# Patient Record
Sex: Female | Born: 1935
Health system: Southern US, Community
[De-identification: ages and names within clinical notes are randomized; demographics above are authoritative.]

## PROBLEM LIST (undated history)

## (undated) DIAGNOSIS — K8020 Calculus of gallbladder without cholecystitis without obstruction: Secondary | ICD-10-CM

## (undated) DIAGNOSIS — Z860100 Personal history of colon polyps, unspecified: Secondary | ICD-10-CM

## (undated) DIAGNOSIS — K5730 Diverticulosis of large intestine without perforation or abscess without bleeding: Secondary | ICD-10-CM

## (undated) DIAGNOSIS — N183 Chronic kidney disease, stage 3 (moderate): Secondary | ICD-10-CM

## (undated) DIAGNOSIS — I351 Nonrheumatic aortic (valve) insufficiency: Secondary | ICD-10-CM

## (undated) DIAGNOSIS — E785 Hyperlipidemia, unspecified: Secondary | ICD-10-CM

## (undated) DIAGNOSIS — F419 Anxiety disorder, unspecified: Secondary | ICD-10-CM

## (undated) DIAGNOSIS — I10 Essential (primary) hypertension: Secondary | ICD-10-CM

## (undated) DIAGNOSIS — M1990 Unspecified osteoarthritis, unspecified site: Secondary | ICD-10-CM

## (undated) DIAGNOSIS — M5030 Other cervical disc degeneration, unspecified cervical region: Secondary | ICD-10-CM

## (undated) DIAGNOSIS — J449 Chronic obstructive pulmonary disease, unspecified: Secondary | ICD-10-CM

## (undated) HISTORY — PX: ABDOMINAL HYSTERECTOMY: SHX81

## (undated) HISTORY — DX: Essential (primary) hypertension: I10

## (undated) HISTORY — DX: Hyperlipidemia, unspecified: E78.5

## (undated) HISTORY — PX: CATARACT EXTRACTION: SUR2

## (undated) HISTORY — DX: Diverticulosis of large intestine without perforation or abscess without bleeding: K57.30

## (undated) HISTORY — DX: Anxiety disorder, unspecified: F41.9

## (undated) HISTORY — PX: COLONOSCOPY W/ POLYPECTOMY: SHX1380

## (undated) HISTORY — DX: Unspecified osteoarthritis, unspecified site: M19.90

## (undated) HISTORY — PX: EYE SURGERY: SHX253

## (undated) HISTORY — DX: Personal history of colon polyps, unspecified: Z86.0100

## (undated) HISTORY — DX: Calculus of gallbladder without cholecystitis without obstruction: K80.20

## (undated) HISTORY — DX: Chronic kidney disease, stage 3 (moderate): N18.3

## (undated) HISTORY — DX: Nonrheumatic aortic (valve) insufficiency: I35.1

## (undated) HISTORY — DX: Chronic obstructive pulmonary disease, unspecified: J44.9

## (undated) HISTORY — PX: OOPHORECTOMY: SHX86

## (undated) HISTORY — DX: Other cervical disc degeneration, unspecified cervical region: M50.30

## (undated) HISTORY — DX: Personal history of colonic polyps: Z86.010

---

## 2003-12-20 ENCOUNTER — Ambulatory Visit (HOSPITAL_COMMUNITY): Admission: RE | Admit: 2003-12-20 | Discharge: 2003-12-20 | Payer: Self-pay | Admitting: Internal Medicine

## 2003-12-31 ENCOUNTER — Ambulatory Visit: Payer: Self-pay

## 2004-05-21 ENCOUNTER — Other Ambulatory Visit: Admission: RE | Admit: 2004-05-21 | Discharge: 2004-05-21 | Payer: Self-pay | Admitting: Obstetrics and Gynecology

## 2005-02-08 ENCOUNTER — Ambulatory Visit: Payer: Self-pay | Admitting: Internal Medicine

## 2005-02-16 ENCOUNTER — Ambulatory Visit: Payer: Self-pay | Admitting: Internal Medicine

## 2005-03-04 ENCOUNTER — Ambulatory Visit: Payer: Self-pay | Admitting: Internal Medicine

## 2005-03-22 ENCOUNTER — Ambulatory Visit (HOSPITAL_COMMUNITY): Admission: RE | Admit: 2005-03-22 | Discharge: 2005-03-22 | Payer: Self-pay | Admitting: Internal Medicine

## 2006-01-06 ENCOUNTER — Ambulatory Visit: Payer: Self-pay | Admitting: Internal Medicine

## 2006-01-06 LAB — CONVERTED CEMR LAB
ALT: 34 units/L (ref 0–40)
AST: 30 units/L (ref 0–37)
Albumin: 4.3 g/dL (ref 3.5–5.2)
Alkaline Phosphatase: 64 units/L (ref 39–117)
BUN: 19 mg/dL (ref 6–23)
Bacteria, U Microscopic: NEGATIVE /hpf
Basophils Absolute: 0 10*3/uL (ref 0.0–0.1)
Basophils Relative: 0.4 % (ref 0.0–1.0)
Bilirubin Urine: NEGATIVE
CO2: 27 meq/L (ref 19–32)
Calcium: 9.8 mg/dL (ref 8.4–10.5)
Chloride: 103 meq/L (ref 96–112)
Chol/HDL Ratio, serum: 3.8
Cholesterol: 204 mg/dL (ref 0–200)
Creatinine, Ser: 1.2 mg/dL (ref 0.4–1.2)
Crystals: NEGATIVE
Eosinophil percent: 3.9 % (ref 0.0–5.0)
GFR calc non Af Amer: 47 mL/min
Glomerular Filtration Rate, Af Am: 57 mL/min/{1.73_m2}
Glucose, Bld: 91 mg/dL (ref 70–99)
HCT: 41.8 % (ref 36.0–46.0)
HDL: 53.8 mg/dL (ref >39.0)
Hemoglobin, Urine: NEGATIVE
Hemoglobin: 13.8 g/dL (ref 12.0–15.0)
Ketones, ur: NEGATIVE mg/dL
LDL DIRECT: 125.8 mg/dL
Lymphocytes Relative: 35.4 % (ref 12.0–46.0)
MCHC: 33.1 g/dL (ref 30.0–36.0)
MCV: 90.4 fL (ref 78.0–100.0)
Monocytes Absolute: 0.5 10*3/uL (ref 0.2–0.7)
Monocytes Relative: 11 % (ref 3.0–11.0)
Mucus, UA: NEGATIVE
Neutro Abs: 2.3 10*3/uL (ref 1.4–7.7)
Neutrophils Relative %: 49.3 % (ref 43.0–77.0)
Nitrite: NEGATIVE
Platelets: 189 10*3/uL (ref 150–400)
Potassium: 4.1 meq/L (ref 3.5–5.1)
RBC: 4.63 M/uL (ref 3.87–5.11)
RDW: 12.9 % (ref 11.5–14.6)
Sodium: 138 meq/L (ref 135–145)
Specific Gravity, Urine: 1.02 (ref 1.000–1.03)
TSH: 0.87 microintl units/mL (ref 0.35–5.50)
Total Bilirubin: 0.8 mg/dL (ref 0.3–1.2)
Total Protein, Urine: NEGATIVE mg/dL
Total Protein: 8.2 g/dL (ref 6.0–8.3)
Triglyceride fasting, serum: 91 mg/dL (ref 0–149)
Urine Glucose: NEGATIVE mg/dL
Urobilinogen, UA: 0.2 (ref 0.0–1.0)
VLDL: 18 mg/dL (ref 0–40)
WBC: 4.6 10*3/uL (ref 4.5–10.5)
pH: 7 (ref 5.0–8.0)

## 2006-03-24 ENCOUNTER — Ambulatory Visit: Payer: Self-pay | Admitting: Internal Medicine

## 2006-04-05 ENCOUNTER — Encounter: Admission: RE | Admit: 2006-04-05 | Discharge: 2006-04-05 | Payer: Self-pay | Admitting: Radiology

## 2006-05-11 ENCOUNTER — Ambulatory Visit: Payer: Self-pay | Admitting: Internal Medicine

## 2006-08-25 ENCOUNTER — Ambulatory Visit: Payer: Self-pay | Admitting: Internal Medicine

## 2006-09-15 ENCOUNTER — Ambulatory Visit: Payer: Self-pay | Admitting: Internal Medicine

## 2006-09-15 LAB — CONVERTED CEMR LAB
BUN: 15 mg/dL (ref 6–23)
Creatinine, Ser: 1.2 mg/dL (ref 0.4–1.2)

## 2006-09-16 ENCOUNTER — Ambulatory Visit: Payer: Self-pay | Admitting: Cardiovascular Disease

## 2006-12-29 ENCOUNTER — Ambulatory Visit: Payer: Self-pay | Admitting: Internal Medicine

## 2006-12-29 ENCOUNTER — Encounter: Payer: Self-pay | Admitting: Internal Medicine

## 2006-12-29 DIAGNOSIS — M5030 Other cervical disc degeneration, unspecified cervical region: Secondary | ICD-10-CM

## 2006-12-29 DIAGNOSIS — R942 Abnormal results of pulmonary function studies: Secondary | ICD-10-CM

## 2006-12-29 DIAGNOSIS — K8020 Calculus of gallbladder without cholecystitis without obstruction: Secondary | ICD-10-CM | POA: Insufficient documentation

## 2006-12-29 DIAGNOSIS — M47817 Spondylosis without myelopathy or radiculopathy, lumbosacral region: Secondary | ICD-10-CM

## 2006-12-30 DIAGNOSIS — E785 Hyperlipidemia, unspecified: Secondary | ICD-10-CM | POA: Insufficient documentation

## 2006-12-30 DIAGNOSIS — F411 Generalized anxiety disorder: Secondary | ICD-10-CM

## 2006-12-30 DIAGNOSIS — I10 Essential (primary) hypertension: Secondary | ICD-10-CM

## 2006-12-30 DIAGNOSIS — K5730 Diverticulosis of large intestine without perforation or abscess without bleeding: Secondary | ICD-10-CM | POA: Insufficient documentation

## 2006-12-30 DIAGNOSIS — Z860100 Personal history of colon polyps, unspecified: Secondary | ICD-10-CM | POA: Insufficient documentation

## 2006-12-30 DIAGNOSIS — M8580 Other specified disorders of bone density and structure, unspecified site: Secondary | ICD-10-CM

## 2006-12-30 DIAGNOSIS — J309 Allergic rhinitis, unspecified: Secondary | ICD-10-CM | POA: Insufficient documentation

## 2007-08-02 ENCOUNTER — Ambulatory Visit: Payer: Self-pay | Admitting: Internal Medicine

## 2007-08-02 DIAGNOSIS — J984 Other disorders of lung: Secondary | ICD-10-CM

## 2007-08-04 ENCOUNTER — Ambulatory Visit: Payer: Self-pay | Admitting: Internal Medicine

## 2007-08-04 LAB — CONVERTED CEMR LAB
ALT: 34 units/L (ref 0–35)
AST: 33 units/L (ref 0–37)
Albumin: 4.1 g/dL (ref 3.5–5.2)
Alkaline Phosphatase: 62 units/L (ref 39–117)
BUN: 13 mg/dL (ref 6–23)
Bacteria, UA: NEGATIVE
Basophils Absolute: 0 10*3/uL (ref 0.0–0.1)
Basophils Relative: 0.4 % (ref 0.0–1.0)
Bilirubin Urine: NEGATIVE
Bilirubin, Direct: 0.1 mg/dL (ref 0.0–0.3)
CO2: 24 meq/L (ref 19–32)
Calcium: 9.7 mg/dL (ref 8.4–10.5)
Chloride: 104 meq/L (ref 96–112)
Cholesterol: 209 mg/dL (ref 0–200)
Creatinine, Ser: 1.2 mg/dL (ref 0.4–1.2)
Crystals: NEGATIVE
Direct LDL: 128 mg/dL
Eosinophils Absolute: 0.2 10*3/uL (ref 0.0–0.7)
Eosinophils Relative: 5.1 % — ABNORMAL HIGH (ref 0.0–5.0)
GFR calc Af Amer: 57 mL/min
GFR calc non Af Amer: 47 mL/min
Glucose, Bld: 112 mg/dL — ABNORMAL HIGH (ref 70–99)
HCT: 43.7 % (ref 36.0–46.0)
HDL: 54.3 mg/dL (ref >39.0)
Hemoglobin, Urine: NEGATIVE
Hemoglobin: 14.7 g/dL (ref 12.0–15.0)
Ketones, ur: NEGATIVE mg/dL
Lymphocytes Relative: 44.2 % (ref 12.0–46.0)
MCHC: 33.7 g/dL (ref 30.0–36.0)
MCV: 90.7 fL (ref 78.0–100.0)
Monocytes Absolute: 0.6 10*3/uL (ref 0.1–1.0)
Monocytes Relative: 14.9 % — ABNORMAL HIGH (ref 3.0–12.0)
Mucus, UA: NEGATIVE
Neutro Abs: 1.5 10*3/uL (ref 1.4–7.7)
Neutrophils Relative %: 35.4 % — ABNORMAL LOW (ref 43.0–77.0)
Nitrite: NEGATIVE
Platelets: 169 10*3/uL (ref 150–400)
Potassium: 4.4 meq/L (ref 3.5–5.1)
RBC / HPF: NONE SEEN
RBC: 4.81 M/uL (ref 3.87–5.11)
RDW: 13.2 % (ref 11.5–14.6)
Sodium: 137 meq/L (ref 135–145)
Specific Gravity, Urine: 1.015 (ref 1.000–1.03)
TSH: 0.85 microintl units/mL (ref 0.35–5.50)
Total Bilirubin: 1.1 mg/dL (ref 0.3–1.2)
Total CHOL/HDL Ratio: 3.8
Total Protein, Urine: NEGATIVE mg/dL
Total Protein: 8.2 g/dL (ref 6.0–8.3)
Triglycerides: 114 mg/dL (ref 0–149)
Urine Glucose: NEGATIVE mg/dL
Urobilinogen, UA: 0.2 (ref 0.0–1.0)
VLDL: 23 mg/dL (ref 0–40)
WBC: 4.1 10*3/uL — ABNORMAL LOW (ref 4.5–10.5)
pH: 7 (ref 5.0–8.0)

## 2007-08-09 ENCOUNTER — Encounter: Admission: RE | Admit: 2007-08-09 | Discharge: 2007-08-09 | Payer: Self-pay | Admitting: Internal Medicine

## 2007-10-11 ENCOUNTER — Telehealth (INDEPENDENT_AMBULATORY_CARE_PROVIDER_SITE_OTHER): Payer: Self-pay | Admitting: *Deleted

## 2007-10-12 ENCOUNTER — Ambulatory Visit: Payer: Self-pay | Admitting: Internal Medicine

## 2007-10-12 DIAGNOSIS — R079 Chest pain, unspecified: Secondary | ICD-10-CM

## 2007-10-24 ENCOUNTER — Encounter: Payer: Self-pay | Admitting: Internal Medicine

## 2007-10-24 ENCOUNTER — Ambulatory Visit: Payer: Self-pay

## 2007-12-07 ENCOUNTER — Ambulatory Visit: Payer: Self-pay | Admitting: Internal Medicine

## 2008-01-11 ENCOUNTER — Telehealth (INDEPENDENT_AMBULATORY_CARE_PROVIDER_SITE_OTHER): Payer: Self-pay | Admitting: *Deleted

## 2008-01-29 ENCOUNTER — Telehealth (INDEPENDENT_AMBULATORY_CARE_PROVIDER_SITE_OTHER): Payer: Self-pay | Admitting: *Deleted

## 2008-02-01 ENCOUNTER — Encounter: Payer: Self-pay | Admitting: Internal Medicine

## 2008-02-01 ENCOUNTER — Telehealth: Payer: Self-pay | Admitting: Internal Medicine

## 2008-07-22 ENCOUNTER — Telehealth (INDEPENDENT_AMBULATORY_CARE_PROVIDER_SITE_OTHER): Payer: Self-pay | Admitting: *Deleted

## 2008-08-06 ENCOUNTER — Ambulatory Visit: Payer: Self-pay | Admitting: Internal Medicine

## 2008-08-06 LAB — CONVERTED CEMR LAB
ALT: 33 U/L
AST: 34 U/L
Albumin: 4.1 g/dL
Alkaline Phosphatase: 62 U/L
BUN: 14 mg/dL
Basophils Absolute: 0 K/uL
Basophils Relative: 0.7 %
Bilirubin Urine: NEGATIVE
Bilirubin, Direct: 0.1 mg/dL
CO2: 28 meq/L
Calcium: 9.5 mg/dL
Chloride: 110 meq/L
Cholesterol: 210 mg/dL — ABNORMAL HIGH
Creatinine, Ser: 1.1 mg/dL
Direct LDL: 130.2 mg/dL
Eosinophils Absolute: 0.2 K/uL
Eosinophils Relative: 5.1 % — ABNORMAL HIGH
GFR calc non Af Amer: 62.57 mL/min
Glucose, Bld: 90 mg/dL
HCT: 39.2 %
HDL: 58 mg/dL
Hemoglobin, Urine: NEGATIVE
Hemoglobin: 13.2 g/dL
Ketones, ur: NEGATIVE mg/dL
Leukocytes, UA: NEGATIVE
Lymphocytes Relative: 41.9 %
Lymphs Abs: 1.6 K/uL
MCHC: 33.5 g/dL
MCV: 89.6 fL
Monocytes Absolute: 0.5 K/uL
Monocytes Relative: 12.3 % — ABNORMAL HIGH
Neutro Abs: 1.5 K/uL
Neutrophils Relative %: 40 % — ABNORMAL LOW
Nitrite: NEGATIVE
Platelets: 150 K/uL
Potassium: 4.2 meq/L
RBC: 4.38 M/uL
RDW: 13 %
Sodium: 142 meq/L
Specific Gravity, Urine: 1.015
TSH: 0.87 u[IU]/mL
Total Bilirubin: 0.9 mg/dL
Total CHOL/HDL Ratio: 4
Total Protein, Urine: NEGATIVE mg/dL
Total Protein: 8.1 g/dL
Triglycerides: 100 mg/dL
Urine Glucose: NEGATIVE mg/dL
Urobilinogen, UA: 0.2
VLDL: 20 mg/dL
WBC: 3.8 10*3/microliter — ABNORMAL LOW
pH: 7

## 2008-08-09 ENCOUNTER — Ambulatory Visit: Payer: Self-pay | Admitting: Internal Medicine

## 2008-08-09 ENCOUNTER — Encounter: Payer: Self-pay | Admitting: Internal Medicine

## 2008-08-09 DIAGNOSIS — M549 Dorsalgia, unspecified: Secondary | ICD-10-CM | POA: Insufficient documentation

## 2008-08-09 DIAGNOSIS — R209 Unspecified disturbances of skin sensation: Secondary | ICD-10-CM | POA: Insufficient documentation

## 2008-08-09 DIAGNOSIS — R222 Localized swelling, mass and lump, trunk: Secondary | ICD-10-CM | POA: Insufficient documentation

## 2008-08-14 ENCOUNTER — Ambulatory Visit: Payer: Self-pay | Admitting: Internal Medicine

## 2008-08-15 ENCOUNTER — Ambulatory Visit (HOSPITAL_COMMUNITY): Admission: RE | Admit: 2008-08-15 | Discharge: 2008-08-15 | Payer: Self-pay | Admitting: Internal Medicine

## 2008-11-13 ENCOUNTER — Telehealth: Payer: Self-pay | Admitting: Internal Medicine

## 2008-11-21 ENCOUNTER — Telehealth: Payer: Self-pay | Admitting: Internal Medicine

## 2008-12-04 ENCOUNTER — Ambulatory Visit: Payer: Self-pay | Admitting: Internal Medicine

## 2008-12-04 ENCOUNTER — Telehealth: Payer: Self-pay | Admitting: Internal Medicine

## 2008-12-10 ENCOUNTER — Ambulatory Visit: Payer: Self-pay | Admitting: Internal Medicine

## 2008-12-12 ENCOUNTER — Ambulatory Visit: Payer: Self-pay | Admitting: Internal Medicine

## 2008-12-12 LAB — HM COLONOSCOPY

## 2009-04-18 ENCOUNTER — Ambulatory Visit: Payer: Self-pay | Admitting: Internal Medicine

## 2009-04-18 DIAGNOSIS — M25519 Pain in unspecified shoulder: Secondary | ICD-10-CM

## 2009-04-18 DIAGNOSIS — M542 Cervicalgia: Secondary | ICD-10-CM

## 2009-04-25 ENCOUNTER — Telehealth: Payer: Self-pay | Admitting: Internal Medicine

## 2009-04-30 ENCOUNTER — Telehealth (INDEPENDENT_AMBULATORY_CARE_PROVIDER_SITE_OTHER): Payer: Self-pay | Admitting: *Deleted

## 2009-05-01 ENCOUNTER — Ambulatory Visit: Payer: Self-pay | Admitting: Internal Medicine

## 2009-05-01 DIAGNOSIS — J449 Chronic obstructive pulmonary disease, unspecified: Secondary | ICD-10-CM | POA: Insufficient documentation

## 2009-05-01 DIAGNOSIS — J019 Acute sinusitis, unspecified: Secondary | ICD-10-CM

## 2009-05-21 ENCOUNTER — Ambulatory Visit: Payer: Self-pay | Admitting: Internal Medicine

## 2009-05-21 DIAGNOSIS — R300 Dysuria: Secondary | ICD-10-CM | POA: Insufficient documentation

## 2009-05-22 ENCOUNTER — Encounter: Payer: Self-pay | Admitting: Internal Medicine

## 2009-05-22 LAB — CONVERTED CEMR LAB
Basophils Absolute: 0 10*3/uL (ref 0.0–0.1)
Basophils Relative: 0.8 % (ref 0.0–3.0)
Bilirubin Urine: NEGATIVE
Eosinophils Absolute: 0.2 10*3/uL (ref 0.0–0.7)
Eosinophils Relative: 3.6 % (ref 0.0–5.0)
HCT: 40.3 % (ref 36.0–46.0)
Hemoglobin, Urine: NEGATIVE
Hemoglobin: 13.1 g/dL (ref 12.0–15.0)
Lymphocytes Relative: 39.5 % (ref 12.0–46.0)
Lymphs Abs: 1.9 10*3/uL (ref 0.7–4.0)
MCHC: 32.4 g/dL (ref 30.0–36.0)
MCV: 90.6 fL (ref 78.0–100.0)
Monocytes Absolute: 0.7 10*3/uL (ref 0.1–1.0)
Monocytes Relative: 13.7 % — ABNORMAL HIGH (ref 3.0–12.0)
Neutro Abs: 2 10*3/uL (ref 1.4–7.7)
Neutrophils Relative %: 42.4 % — ABNORMAL LOW (ref 43.0–77.0)
Nitrite: NEGATIVE
Platelets: 239 10*3/uL (ref 150.0–400.0)
RBC: 4.45 M/uL (ref 3.87–5.11)
RDW: 13.5 % (ref 11.5–14.6)
Specific Gravity, Urine: 1.01 (ref 1.000–1.030)
Total Protein, Urine: NEGATIVE mg/dL
Urine Glucose: NEGATIVE mg/dL
Urobilinogen, UA: 0.2 (ref 0.0–1.0)
WBC: 4.8 10*3/uL (ref 4.5–10.5)
pH: 7.5 (ref 5.0–8.0)

## 2009-09-12 ENCOUNTER — Ambulatory Visit: Payer: Self-pay | Admitting: Internal Medicine

## 2009-09-12 LAB — CONVERTED CEMR LAB
ALT: 31 units/L (ref 0–35)
AST: 30 units/L (ref 0–37)
Albumin: 4.1 g/dL (ref 3.5–5.2)
Alkaline Phosphatase: 67 units/L (ref 39–117)
BUN: 14 mg/dL (ref 6–23)
Basophils Absolute: 0 10*3/uL (ref 0.0–0.1)
Basophils Relative: 0.7 % (ref 0.0–3.0)
Bilirubin Urine: NEGATIVE
Bilirubin, Direct: 0.1 mg/dL (ref 0.0–0.3)
CO2: 26 meq/L (ref 19–32)
Calcium: 9.5 mg/dL (ref 8.4–10.5)
Chloride: 108 meq/L (ref 96–112)
Cholesterol: 231 mg/dL — ABNORMAL HIGH (ref 0–200)
Creatinine, Ser: 1.1 mg/dL (ref 0.4–1.2)
Direct LDL: 144.3 mg/dL
Eosinophils Absolute: 0.2 10*3/uL (ref 0.0–0.7)
Eosinophils Relative: 4.5 % (ref 0.0–5.0)
GFR calc non Af Amer: 64.41 mL/min (ref >60)
Glucose, Bld: 82 mg/dL (ref 70–99)
HCT: 40.3 % (ref 36.0–46.0)
HDL: 59.1 mg/dL (ref >39.00)
Hemoglobin, Urine: NEGATIVE
Hemoglobin: 13.8 g/dL (ref 12.0–15.0)
Ketones, ur: NEGATIVE mg/dL
Leukocytes, UA: NEGATIVE
Lymphocytes Relative: 47.5 % — ABNORMAL HIGH (ref 12.0–46.0)
Lymphs Abs: 2.2 10*3/uL (ref 0.7–4.0)
MCHC: 34.2 g/dL (ref 30.0–36.0)
MCV: 89.1 fL (ref 78.0–100.0)
Monocytes Absolute: 0.5 10*3/uL (ref 0.1–1.0)
Monocytes Relative: 12.1 % — ABNORMAL HIGH (ref 3.0–12.0)
Neutro Abs: 1.6 10*3/uL (ref 1.4–7.7)
Neutrophils Relative %: 35.2 % — ABNORMAL LOW (ref 43.0–77.0)
Nitrite: NEGATIVE
Platelets: 152 10*3/uL (ref 150.0–400.0)
Potassium: 4.5 meq/L (ref 3.5–5.1)
RBC: 4.52 M/uL (ref 3.87–5.11)
RDW: 13.6 % (ref 11.5–14.6)
Sodium: 138 meq/L (ref 135–145)
Specific Gravity, Urine: 1.02 (ref 1.000–1.030)
TSH: 0.94 microintl units/mL (ref 0.35–5.50)
Total Bilirubin: 0.8 mg/dL (ref 0.3–1.2)
Total CHOL/HDL Ratio: 4
Total Protein, Urine: NEGATIVE mg/dL
Total Protein: 7.7 g/dL (ref 6.0–8.3)
Triglycerides: 90 mg/dL (ref 0.0–149.0)
Urine Glucose: NEGATIVE mg/dL
Urobilinogen, UA: 0.2 (ref 0.0–1.0)
VLDL: 18 mg/dL (ref 0.0–40.0)
WBC: 4.5 10*3/uL (ref 4.5–10.5)
pH: 7 (ref 5.0–8.0)

## 2009-09-17 ENCOUNTER — Ambulatory Visit: Payer: Self-pay | Admitting: Internal Medicine

## 2009-09-24 ENCOUNTER — Encounter: Payer: Self-pay | Admitting: Internal Medicine

## 2009-09-24 ENCOUNTER — Ambulatory Visit: Payer: Self-pay | Admitting: Internal Medicine

## 2009-12-01 ENCOUNTER — Ambulatory Visit (HOSPITAL_COMMUNITY): Admission: RE | Admit: 2009-12-01 | Discharge: 2009-12-01 | Payer: Self-pay | Admitting: Internal Medicine

## 2009-12-01 LAB — HM MAMMOGRAPHY: HM Mammogram: NEGATIVE

## 2010-03-22 ENCOUNTER — Encounter: Payer: Self-pay | Admitting: Radiology

## 2010-03-31 NOTE — Assessment & Plan Note (Signed)
Summary: DIZZY/COUGHS UP GREEN STUFF/NWS   Vital Signs:  Patient profile:   77 year old female Height:      64 inches (162.56 cm) Weight:      192.8 pounds (87.64 kg) O2 Sat:      96 % on Room air Temp:     98.0 degrees F (36.67 degrees C) oral Pulse rate:   85 / minute BP sitting:   142 / 96  (left arm) Cuff size:   large  Vitals Entered By: Orlan Leavens (May 21, 2009 3:06 PM)  O2 Flow:  Room air CC: Cough, coughing up phlegm with color, also been having some dizziness Is Patient Diabetic? No Pain Assessment Patient in pain? no        Primary Care Provider:  Corwin Levins MD  CC:  Cough, coughing up phlegm with color, and also been having some dizziness.  History of Present Illness: seen 05/01/09 here for similar symptoms - given augmentin and hydromet syrup - reports relief with tx initially...   now with inc in sputum from nares and chest - deep yellow - onset of current symptoms 4 days ago +nasal congestion and drainage - denies fever or HA - +a/w dizzy spells yesterday and again this AM - spell lasted approx 5-10 minutes also noted "green urine" this AM with some dysuria -  no abd pain or flank pain - worried about persisting infx and would like blood check for same   Clinical Review Panels:  Immunizations   Last Tetanus Booster:  Td (08/09/2008)   Last Flu Vaccine:  Fluvax 3+ (12/10/2008)   Last Pneumovax:  given (11/30/2006)  CBC   WBC:  3.8 (08/06/2008)   RBC:  4.38 (08/06/2008)   Hgb:  13.2 (08/06/2008)   Hct:  39.2 (08/06/2008)   Platelets:  150.0 (08/06/2008)   MCV  89.6 (08/06/2008)   MCHC  33.5 (08/06/2008)   RDW  13.0 (08/06/2008)   PMN:  40.0 (08/06/2008)   Lymphs:  41.9 (08/06/2008)   Monos:  12.3 (08/06/2008)   Eosinophils:  5.1 (08/06/2008)   Basophil:  0.7 (08/06/2008)   Current Medications (verified): 1)  Actonel 150 Mg  Tabs (Risedronate Sodium) .Marland Kitchen.. 1 By Mouth Q Month 2)  Diazepam 5 Mg Tabs (Diazepam) .... Take 1 Tablet By Mouth  Twice A Day As Needed 3)  Labetalol Hcl 200 Mg Tabs (Labetalol Hcl) .Marland Kitchen.. 1 Tablet By Mouth Twice A Day 4)  Spironolactone 50 Mg Tabs (Spironolactone) .... Take 1 Tablet By Mouth Twice A Day 5)  Ecotrin 325 Mg  Tbec (Aspirin) .Marland Kitchen.. 1 Po Qd 6)  Tramadol Hcl 50 Mg Tabs (Tramadol Hcl) .Marland Kitchen.. 1 - 2 By Mouth Q 6 Hrs As Needed Pain 7)  Hydrocodone-Homatropine 5-1.5 Mg/88ml Syrp (Hydrocodone-Homatropine) .Marland Kitchen.. 1 Tsp By Mouth Q 6 Hrs As Needed Cough  Allergies (verified): 1)  ! * Amlodipine  Past History:  Past Medical History: Hypertension Osteoporosis Anxiety   Hyperlipidemia Allergic rhinitis gallstones LS Spine djd Cervical disc dz Colonic polyps, hx of Diverticulosis, colon COPD  Review of Systems  The patient denies weight loss, syncope, and abdominal pain.    Physical Exam  General:  alert and overweight-appearing., mild ill  Eyes:  vision grossly intact; pupils equal, round and reactive to light.  conjunctiva and lids normal.    Ears:  bilat tm';s red, sinus tender bilat Mouth:  mild pharyngeal erythema and fair dentition.  +PND Lungs:  normal respiratory effort and normal breath sounds.   Heart:  normal rate and regular rhythm.     Impression & Recommendations:  Problem # 1:  SINUSITIS- ACUTE-NOS (ICD-461.9) ?allg component - as improved dramatically with abx <3 weeks ago, will repeat tx x 7 days now - consider adding nasal steroid or antihistamine to help with allg symptoms (pt wishes to d/w PCP before starting new med) The following medications were removed from the medication list:    Amoxicillin-pot Clavulanate 875-125 Mg Tabs (Amoxicillin-pot clavulanate) .Marland Kitchen... 1 by mouth two times a day Her updated medication list for this problem includes:    Hydrocodone-homatropine 5-1.5 Mg/16ml Syrp (Hydrocodone-homatropine) .Marland Kitchen... 1 tsp by mouth q 6 hrs as needed cough    Amoxicillin-pot Clavulanate 875-125 Mg Tabs (Amoxicillin-pot clavulanate) .Marland Kitchen... 1 by mouth two times a day x 7  days  Orders: TLB-CBC Platelet - w/Differential (85025-[id number]) Prescription Created Electronically [phone])  Instructed on treatment. Call if symptoms persist or worsen.   Problem # 2:  DYSURIA (ICD-788.1)  The following medications were removed from the medication list:    Amoxicillin-pot Clavulanate 875-125 Mg Tabs (Amoxicillin-pot clavulanate) .Marland Kitchen... 1 by mouth two times a day Her updated medication list for this problem includes:    Amoxicillin-pot Clavulanate 875-125 Mg Tabs (Amoxicillin-pot clavulanate) .Marland Kitchen... 1 by mouth two times a day x 7 days  Orders: TLB-Udip w/ Micro (81001-URINE) T-Culture, Urine ([id number])  Encouraged to push clear liquids, get enough rest, and take acetaminophen as needed. To be seen in 10 days if no improvement, sooner if worse.  Problem # 3:  OSTEOPOROSIS (ICD-733.00) reports not taking her med -- defer to d/w PCP at CPX 07/2009 Her updated medication list for this problem includes:    Actonel 150 Mg Tabs (Risedronate sodium) .Marland Kitchen... 1 by mouth q month  Complete Medication List: 1)  Actonel 150 Mg Tabs (Risedronate sodium) .Marland Kitchen.. 1 by mouth q month 2)  Diazepam 5 Mg Tabs (Diazepam) .... Take 1 tablet by mouth twice a day as needed 3)  Labetalol Hcl 200 Mg Tabs (Labetalol hcl) .Marland Kitchen.. 1 tablet by mouth twice a day 4)  Spironolactone 50 Mg Tabs (Spironolactone) .... Take 1 tablet by mouth twice a day 5)  Ecotrin 325 Mg Tbec (Aspirin) .Marland Kitchen.. 1 po qd 6)  Tramadol Hcl 50 Mg Tabs (Tramadol hcl) .Marland Kitchen.. 1 - 2 by mouth q 6 hrs as needed pain 7)  Hydrocodone-homatropine 5-1.5 Mg/24ml Syrp (Hydrocodone-homatropine) .Marland Kitchen.. 1 tsp by mouth q 6 hrs as needed cough 8)  Amoxicillin-pot Clavulanate 875-125 Mg Tabs (Amoxicillin-pot clavulanate) .Marland Kitchen.. 1 by mouth two times a day x 7 days  Patient Instructions: 1)  it was good to see you today. 2)  will treat with 7days antibiotic course for your sinus symptoms and infection - your prescriptions have been electronically submitted to  your pharmacy. Please take as directed. Contact our office if you believe you're having problems with the medication(s).  3)  test(s) ordered today - your results will be posted on the phone tree for review in 48-72 hours from the time of test completion; call [phone] and enter your 9 digit MRN (listed above on this page, just below your name); if any changes need to be made or there are abnormal results, you will be contacted directly.  4)  Please keep follow-up appointment with Dr.John for physical in June, call sooner if problems.  Prescriptions: AMOXICILLIN-POT CLAVULANATE 875-125 MG TABS (AMOXICILLIN-POT CLAVULANATE) 1 by mouth two times a day x 7 days  #14 x 0   Entered and Authorized by:  Newt Lukes MD   Signed by:   Newt Lukes MD on 05/21/2009   Method used:   Electronically to        [facility] ([address] [phone]* (retail)       [address]       [place], [place]  [address]       Ph: [phone] or [phone]       Fax: [phone]   RxID:   [id number]

## 2010-03-31 NOTE — Assessment & Plan Note (Signed)
Summary: CONGESTION-BLOWING OUT GREEN MUCUS-EAR PAIN-STC   Vital Signs:  Patient profile:   77 year old female Height:      64 inches Weight:      198.50 pounds BMI:     34.20 O2 Sat:      95 % on Room air Temp:     98.2 degrees F oral Pulse rate:   102 / minute BP sitting:   148 / 82  (left arm) Cuff size:   large  Vitals Entered ByZella Ball Ewing (May 01, 2009 1:54 PM)  O2 Flow:  Room air CC: head and chest congestion,coughing/RE   CC:  head and chest congestion and coughing/RE.  History of Present Illness: not yet taken her BP meds today for some reason; pt here with 3 to 4 days facial pain, pressure, fever and greenish d/c, with St and mild  prod cough and few palpitations;  no wheezing and Pt denies CP, sob, doe, wheezing, orthopnea, pnd, worsening LE edema, palps, dizziness or syncope .  Lives alone, fearful with this acute illness and mild nervous.  Pt denies new neuro symptoms such as headache, facial or extremity weakness  No falls or injury  Problems Prior to Update: 1)  COPD  (ICD-496) 2)  Sinusitis- Acute-nos  (ICD-461.9) 3)  Shoulder Pain, Right  (ICD-719.41) 4)  Chest Pain  (ICD-786.50) 5)  Neck Pain, Right  (ICD-723.1) 6)  Preventive Health Care  (ICD-V70.0) 7)  Swelling, Mass, or Lump in Chest  (ICD-786.6) 8)  Paresthesia  (ICD-782.0) 9)  Back Pain  (ICD-724.5) 10)  Preoperative Examination  (ICD-V72.84) 11)  Chest Pain  (ICD-786.50) 12)  Encounter For Long-term Use of Other Medications  (ICD-V58.69) 13)  Lung Nodule  (ICD-518.89) 14)  Preventive Health Care  (ICD-V70.0) 15)  Computerized Tomography, Chest, Abnormal  (ICD-794.2) 16)  Family History Diabetes 1st Degree Relative  (ICD-V18.0) 17)  Family History of Cad Female 1st Degree Relative <60  (ICD-V16.49) 18)  Family History Breast Cancer 1st Degree Relative <50  (ICD-V16.3) 19)  Diverticulosis, Colon  (ICD-562.10) 20)  Colonic Polyps, Hx of  (ICD-V12.72) 21)  Degenerative Disc Disease, Cervical  Spine  (ICD-722.4) 22)  Osteoarthritis, Lumbosacral Spine  (ICD-721.3) 23)  Gallstones  (ICD-574.20) 24)  Allergic Rhinitis  (ICD-477.9) 25)  Hyperlipidemia  (ICD-272.4) 26)  Anxiety  (ICD-300.00) 27)  Osteoporosis  (ICD-733.00) 28)  Hypertension  (ICD-401.9)  Medications Prior to Update: 1)  Actonel 150 Mg  Tabs (Risedronate Sodium) .Marland Kitchen.. 1 By Mouth Q Month 2)  Diazepam 5 Mg Tabs (Diazepam) .... Take 1 Tablet By Mouth Twice A Day As Needed 3)  Labetalol Hcl 200 Mg Tabs (Labetalol Hcl) .Marland Kitchen.. 1 Tablet By Mouth Twice A Day 4)  Spironolactone 50 Mg Tabs (Spironolactone) .... Take 1 Tablet By Mouth Twice A Day 5)  Ecotrin 325 Mg  Tbec (Aspirin) .Marland Kitchen.. 1 Po Qd 6)  Tramadol Hcl 50 Mg Tabs (Tramadol Hcl) .Marland Kitchen.. 1 - 2 By Mouth Q 6 Hrs As Needed Pain  Current Medications (verified): 1)  Actonel 150 Mg  Tabs (Risedronate Sodium) .Marland Kitchen.. 1 By Mouth Q Month 2)  Diazepam 5 Mg Tabs (Diazepam) .... Take 1 Tablet By Mouth Twice A Day As Needed 3)  Labetalol Hcl 200 Mg Tabs (Labetalol Hcl) .Marland Kitchen.. 1 Tablet By Mouth Twice A Day 4)  Spironolactone 50 Mg Tabs (Spironolactone) .... Take 1 Tablet By Mouth Twice A Day 5)  Ecotrin 325 Mg  Tbec (Aspirin) .Marland Kitchen.. 1 Po Qd 6)  Tramadol Hcl  50 Mg Tabs (Tramadol Hcl) .Marland Kitchen.. 1 - 2 By Mouth Q 6 Hrs As Needed Pain 7)  Amoxicillin-Pot Clavulanate 875-125 Mg Tabs (Amoxicillin-Pot Clavulanate) .Marland Kitchen.. 1 By Mouth Two Times A Day 8)  Hydrocodone-Homatropine 5-1.5 Mg/56ml Syrp (Hydrocodone-Homatropine) .Marland Kitchen.. 1 Tsp By Mouth Q 6 Hrs As Needed Cough  Allergies (verified): 1)  ! * Amlodipine  Past History:  Past Surgical History: Last updated: 12/29/2006 Oophorectomy Hysterectomy Colon polypectomy  Social History: Last updated: 08/09/2008 Former Smoker Alcohol use-no work  - Theme park manager (substance abuse)  Risk Factors: Smoking Status: quit (12/29/2006)  Past Medical History: Hypertension Osteoporosis Anxiety Hyperlipidemia Allergic rhinitis gallstones LS Spine  djd Cervical disc dz Colonic polyps, hx of Diverticulosis, colon COPD  Review of Systems       all otherwise negative per pt -   Physical Exam  General:  alert and overweight-appearing., mild ill  Head:  normocephalic and atraumatic.   Eyes:  vision grossly intact, pupils equal, and pupils round.   Ears:  bilat tm';s red, sinus tender bilat Nose:  nasal dischargemucosal pallor and mucosal edema.   Mouth:  pharyngeal erythema and fair dentition.   Neck:  supple and cervical lymphadenopathy.   Lungs:  normal respiratory effort and normal breath sounds.   Heart:  normal rate and regular rhythm.   Extremities:  .nno no edema, no erythema    Impression & Recommendations:  Problem # 1:  SINUSITIS- ACUTE-NOS (ICD-461.9)  Her updated medication list for this problem includes:    Amoxicillin-pot Clavulanate 875-125 Mg Tabs (Amoxicillin-pot clavulanate) .Marland Kitchen... 1 by mouth two times a day    Hydrocodone-homatropine 5-1.5 Mg/17ml Syrp (Hydrocodone-homatropine) .Marland Kitchen... 1 tsp by mouth q 6 hrs as needed cough treat as above, f/u any worsening signs or symptoms   Problem # 2:  COPD (ICD-496) o/w stable ; d/w pt - declines PFT's at this time or spriva trial  Problem # 3:  HYPERTENSION (ICD-401.9)  Her updated medication list for this problem includes:    Labetalol Hcl 200 Mg Tabs (Labetalol hcl) .Marland Kitchen... 1 tablet by mouth twice a day    Spironolactone 50 Mg Tabs (Spironolactone) .Marland Kitchen... Take 1 tablet by mouth twice a day  BP today: 148/82 Prior BP: 154/100 (04/18/2009)  Labs Reviewed: K+: 4.2 (08/06/2008) Creat: : 1.1 (08/06/2008)   Chol: 210 (08/06/2008)   HDL: 58.00 (08/06/2008)   LDL: DEL (08/04/2007)   TG: 100.0 (08/06/2008) mild elev today, likely situational, ok to follow, continue same treatment   Complete Medication List: 1)  Actonel 150 Mg Tabs (Risedronate sodium) .Marland Kitchen.. 1 by mouth q month 2)  Diazepam 5 Mg Tabs (Diazepam) .... Take 1 tablet by mouth twice a day as needed 3)   Labetalol Hcl 200 Mg Tabs (Labetalol hcl) .Marland Kitchen.. 1 tablet by mouth twice a day 4)  Spironolactone 50 Mg Tabs (Spironolactone) .... Take 1 tablet by mouth twice a day 5)  Ecotrin 325 Mg Tbec (Aspirin) .Marland Kitchen.. 1 po qd 6)  Tramadol Hcl 50 Mg Tabs (Tramadol hcl) .Marland Kitchen.. 1 - 2 by mouth q 6 hrs as needed pain 7)  Amoxicillin-pot Clavulanate 875-125 Mg Tabs (Amoxicillin-pot clavulanate) .Marland Kitchen.. 1 by mouth two times a day 8)  Hydrocodone-homatropine 5-1.5 Mg/84ml Syrp (Hydrocodone-homatropine) .Marland Kitchen.. 1 tsp by mouth q 6 hrs as needed cough  Patient Instructions: 1)  Please take all new medications as prescribed 2)  Continue all previous medications as before this visit  3)  Please schedule a follow-up appointment in 3 months with CPX labs, or  sooner if needed 4)  Please call if you would like the Pulmonary Function test ordered before June Prescriptions: HYDROCODONE-HOMATROPINE 5-1.5 MG/5ML SYRP (HYDROCODONE-HOMATROPINE) 1 tsp by mouth q 6 hrs as needed cough  #6 oz x 1   Entered and Authorized by:   Corwin Levins MD   Signed by:   Corwin Levins MD on 05/01/2009   Method used:   Print then Give to Patient   RxID:   [id number] AMOXICILLIN-POT CLAVULANATE 875-125 MG TABS (AMOXICILLIN-POT CLAVULANATE) 1 by mouth two times a day  #20 x 0   Entered and Authorized by:   Corwin Levins MD   Signed by:   Corwin Levins MD on 05/01/2009   Method used:   Print then Give to Patient   RxID:   [id number]

## 2010-03-31 NOTE — Progress Notes (Signed)
Summary: report/disc needed  Phone Note From Other Clinic   Summary of Call: Dr Ranell Patrick office called stating that patient appt for today was r/s to this upcoming Thurs. b/c they do not have any of the patient x-rays. They need report, but would also like the disc so MD can review the digital films mailed to their office. They are located at [address] [facility]. Initial call taken by: Lucious Groves,  April 30, 2009 10:38 AM  Follow-up for Phone Call        called pt to informed  pt already aware she need these reportd states she will come in our office to get these report from MR  Follow-up by: Shelbie Proctor,  May 05, 2009 10:00 AM

## 2010-03-31 NOTE — Assessment & Plan Note (Signed)
Summary: YEARLY--STC   Vital Signs:  Patient profile:   77 year old female Height:      62 inches Weight:      194.50 pounds BMI:     35.70 O2 Sat:      95 % on Room air Temp:     98.5 degrees F oral Pulse rate:   64 / minute BP sitting:   118 / 78  (left arm) Cuff size:   regular  Vitals Entered By: Zella Ball Ewing CMA Duncan Dull) (September 17, 2009 8:58 AM)  O2 Flow:  Room air  Preventive Care Screening  Bone Density:    Date:  08/02/2007    Next Due:  07/2009    Results:  abnormal std dev  Mammogram:    Date:  07/30/2008    Next Due:  08/2009    Results:  normal   CC: Yearly/RE   Primary Care Provider:  Corwin Levins MD  CC:  Megan Baldwin.  History of Present Illness: overall doing ok;  Pt denies CP, sob, doe, wheezing, orthopnea, pnd, worsening LE edema, palps, dizziness or syncope  Pt denies new neuro symptoms such as headache, facial or extremity weakness   Preventive Screening-Counseling & Management      Drug Use:  no.    Problems Prior to Update: 1)  Dysuria  (ICD-788.1) 2)  COPD  (ICD-496) 3)  Sinusitis- Acute-nos  (ICD-461.9) 4)  Shoulder Pain, Right  (ICD-719.41) 5)  Chest Pain  (ICD-786.50) 6)  Neck Pain, Right  (ICD-723.1) 7)  Preventive Health Care  (ICD-V70.0) 8)  Swelling, Mass, or Lump in Chest  (ICD-786.6) 9)  Paresthesia  (ICD-782.0) 10)  Back Pain  (ICD-724.5) 11)  Preoperative Examination  (ICD-V72.84) 12)  Chest Pain  (ICD-786.50) 13)  Encounter For Long-term Use of Other Medications  (ICD-V58.69) 14)  Lung Nodule  (ICD-518.89) 15)  Preventive Health Care  (ICD-V70.0) 16)  Computerized Tomography, Chest, Abnormal  (ICD-794.2) 17)  Family History Diabetes 1st Degree Relative  (ICD-V18.0) 18)  Family History of Cad Female 1st Degree Relative <60  (ICD-V16.49) 19)  Family History Breast Cancer 1st Degree Relative <50  (ICD-V16.3) 20)  Diverticulosis, Colon  (ICD-562.10) 21)  Colonic Polyps, Hx of  (ICD-V12.72) 22)  Degenerative Disc Disease,  Cervical Spine  (ICD-722.4) 23)  Osteoarthritis, Lumbosacral Spine  (ICD-721.3) 24)  Gallstones  (ICD-574.20) 25)  Allergic Rhinitis  (ICD-477.9) 26)  Hyperlipidemia  (ICD-272.4) 27)  Anxiety  (ICD-300.00) 28)  Osteoporosis  (ICD-733.00) 29)  Hypertension  (ICD-401.9)  Medications Prior to Update: 1)  Actonel 150 Mg  Tabs (Risedronate Sodium) .Marland Kitchen.. 1 By Mouth Q Month 2)  Diazepam 5 Mg Tabs (Diazepam) .... Take 1 Tablet By Mouth Twice A Day As Needed 3)  Labetalol Hcl 200 Mg Tabs (Labetalol Hcl) .Marland Kitchen.. 1 Tablet By Mouth Twice A Day 4)  Spironolactone 50 Mg Tabs (Spironolactone) .... Take 1 Tablet By Mouth Twice A Day 5)  Ecotrin 325 Mg  Tbec (Aspirin) .Marland Kitchen.. 1 Po Qd 6)  Tramadol Hcl 50 Mg Tabs (Tramadol Hcl) .Marland Kitchen.. 1 - 2 By Mouth Q 6 Hrs As Needed Pain 7)  Hydrocodone-Homatropine 5-1.5 Mg/49ml Syrp (Hydrocodone-Homatropine) .Marland Kitchen.. 1 Tsp By Mouth Q 6 Hrs As Needed Cough  Current Medications (verified): 1)  Actonel 150 Mg  Tabs (Risedronate Sodium) .Marland Kitchen.. 1 By Mouth Q Month 2)  Diazepam 5 Mg Tabs (Diazepam) .... Take 1 Tablet By Mouth Twice A Day As Needed 3)  Labetalol Hcl 200 Mg Tabs (Labetalol Hcl) .Marland KitchenMarland KitchenMarland Kitchen 1  Tablet By Mouth Twice A Day 4)  Spironolactone 50 Mg Tabs (Spironolactone) .... Take 1 Tablet By Mouth Twice A Day 5)  Ecotrin 325 Mg  Tbec (Aspirin) .Marland Kitchen.. 1 Po Qd 6)  Tramadol Hcl 50 Mg Tabs (Tramadol Hcl) .Marland Kitchen.. 1 - 2 By Mouth Q 6 Hrs As Needed Pain 7)  Hydrocodone-Homatropine 5-1.5 Mg/10ml Syrp (Hydrocodone-Homatropine) .Marland Kitchen.. 1 Tsp By Mouth Q 6 Hrs As Needed Cough  Allergies (verified): 1)  ! * Amlodipine  Past History:  Past Medical History: Last updated: 05/21/2009 Hypertension Osteoporosis Anxiety   Hyperlipidemia Allergic rhinitis gallstones LS Spine djd Cervical disc dz Colonic polyps, hx of Diverticulosis, colon COPD  Past Surgical History: Last updated: 12/29/2006 Oophorectomy Hysterectomy Colon polypectomy  Family History: Last updated: 12/29/2006 Family History of  Stroke F 1st degree relative Father with Parkinson's Family History Breast cancer 1st degree relative <50 Family History of CAD Female 1st degree relative <60 Family History Diabetes 1st degree relative Family History Hypertension  Social History: Last updated: 09/17/2009 Former Smoker 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 Family History of Stroke F 1st degree relative Father with Parkinson's Family History Breast cancer 1st degree relative <50 Family History of CAD Female 1st degree relative <60 Family History Diabetes 1st degree relative Family History Hypertension  Social History: Reviewed history from 08/09/2008 and no changes required. Former Smoker Alcohol use-no work  - Theme park manager (substance abuse) Drug use-no widow 2 childrenDrug Use:  no  Review of Systems  The patient denies anorexia, fever, weight loss, weight gain, vision loss, decreased hearing, hoarseness, chest pain, syncope, dyspnea on exertion, peripheral edema, prolonged cough, headaches, hemoptysis, abdominal pain, melena, hematochezia, severe indigestion/heartburn, hematuria, suspicious skin lesions, transient blindness, difficulty walking, depression, unusual weight change, abnormal bleeding, enlarged lymph nodes, angioedema, incontinence, genital sores, muscle weakness, breast masses, and testicular masses.         all otherwise negative per pt -    Physical Exam  General:  alert and overweight-appearing.   Head:  normocephalic and atraumatic.   Eyes:  vision grossly intact, pupils equal, and pupils round.   Ears:  R ear normal and L ear normal.   Nose:  no external deformity and no nasal discharge.   Mouth:  no gingival abnormalities and pharynx pink and moist.   Neck:  supple and no masses.   Lungs:  normal  respiratory effort and normal breath sounds.   Heart:  normal rate and regular rhythm.   Abdomen:  soft, non-tender, and normal bowel sounds.   Msk:  no joint tenderness and no joint swelling.   Extremities:  no edema, no erythema  Neurologic:  cranial nerves II-XII intact and strength normal in all extremities.   Skin:  color normal and no rashes.   Psych:  normally interactive and moderately anxious.     Impression & Recommendations:  Problem # 1:  Preventive Health Care (ICD-V70.0)  Overall doing well, age appropriate education and counseling updated and referral for appropriate preventive services done unless declined, immunizations up to date or declined, diet counseling done if overweight, urged to quit smoking if smokes , most recent labs reviewed and current ordered if appropriate, ecg reviewed or declined (interpretation per ECG scanned in the EMR if done); information regarding Medicare Prevention requirements given if appropriate; speciality referrals updated as appropriate   Orders:  EKG w/ Interpretation (93000)  Problem # 2:  OSTEOPOROSIS (ICD-733.00)  Her updated medication list for this problem includes:    Actonel 150 Mg Tabs (Risedronate sodium) .Marland Kitchen... 1 by mouth q month for f/u dxa  Orders: T-Bone Densitometry (91478)  Problem # 3:  ANXIETY (ICD-300.00)  Her updated medication list for this problem includes:    Diazepam 5 Mg Tabs (Diazepam) .Marland Kitchen... Take 1 tablet by mouth twice a day as needed treat as above, f/u any worsening signs or symptoms   Complete Medication List: 1)  Actonel 150 Mg Tabs (Risedronate sodium) .Marland Kitchen.. 1 by mouth q month 2)  Diazepam 5 Mg Tabs (Diazepam) .... Take 1 tablet by mouth twice a day as needed 3)  Labetalol Hcl 200 Mg Tabs (Labetalol hcl) .Marland Kitchen.. 1 tablet by mouth twice a day 4)  Spironolactone 50 Mg Tabs (Spironolactone) .... Take 1 tablet by mouth twice a day 5)  Ecotrin 325 Mg Tbec (Aspirin) .Marland Kitchen.. 1 po qd 6)  Tramadol Hcl 50 Mg Tabs  (Tramadol hcl) .Marland Kitchen.. 1 - 2 by mouth q 6 hrs as needed pain 7)  Hydrocodone-homatropine 5-1.5 Mg/67ml Syrp (Hydrocodone-homatropine) .Marland Kitchen.. 1 tsp by mouth q 6 hrs as needed cough  Patient Instructions: 1)  please schedule the bone density before leaving today 2)  please call for your yearly mammogram 3)  Continue all previous medications as before this visit  4)  Please schedule a follow-up appointment in 1 year or sooner if needed Prescriptions: DIAZEPAM 5 MG TABS (DIAZEPAM) Take 1 tablet by mouth twice a day as needed  #60 x 5   Entered and Authorized by:   Corwin Levins MD   Signed by:   Corwin Levins MD on 09/17/2009   Method used:   Print then Give to Patient   RxID:   [id number]

## 2010-03-31 NOTE — Miscellaneous (Signed)
Summary: BONE DENSITY  Clinical Lists Changes  Orders: Added new Test order of T-Lumbar Vertebral Assessment (77082) - Signed 

## 2010-03-31 NOTE — Assessment & Plan Note (Signed)
Summary: NECK & SHOULDER PAIN / #/ CD   Vital Signs:  Patient profile:   77 year old female Height:      64 inches Weight:      197.25 pounds BMI:     33.98 O2 Sat:      97 % on Room air Temp:     97.5 degrees F oral Pulse rate:   84 / minute BP sitting:   154 / 100  (right arm)  Vitals Entered By: Lucious Groves (April 18, 2009 12:11 PM)  O2 Flow:  Room air CC: C/O neck and shoulder pain for a couple of months and progressively getting worse./kb Is Patient Diabetic? No Pain Assessment Patient in pain? yes     Location: neck Intensity: 10 Type: sharp   CC:  C/O neck and shoulder pain for a couple of months and progressively getting worse./kb.  History of Present Illness: here with pain moderate but grad worse for 2 mo primarily to right shoulder area, but also c/o pain to the right neck and right upper arm as well;  pain is dull with sharp exacerabations, primarily with right shoulder movement expecialy abduction;  no increase of pain to neck and elbow movement; and no rash, swelling, fever, recent wt loss, redness, trauma or fall.  She is concerned about cancer and has some discomfort to the right upper clavicl and chest area as well.  Pt denies other CP, sob, doe, wheezing, orthopnea, pnd, worsening LE edema, palps, dizziness or syncope .  No neuritic symptoms and Pt denies new neuro symptoms such as headache, facial or extremity weakness .  Pain is nonpleuritic, not worse with exertion and states no better wtih OTC pain meds.    Problems Prior to Update: 1)  Shoulder Pain, Right  (ICD-719.41) 2)  Chest Pain  (ICD-786.50) 3)  Neck Pain, Right  (ICD-723.1) 4)  Preventive Health Care  (ICD-V70.0) 5)  Swelling, Mass, or Lump in Chest  (ICD-786.6) 6)  Paresthesia  (ICD-782.0) 7)  Back Pain  (ICD-724.5) 8)  Preoperative Examination  (ICD-V72.84) 9)  Chest Pain  (ICD-786.50) 10)  Encounter For Long-term Use of Other Medications  (ICD-V58.69) 11)  Lung Nodule  (ICD-518.89) 12)   Preventive Health Care  (ICD-V70.0) 13)  Computerized Tomography, Chest, Abnormal  (ICD-794.2) 14)  Family History Diabetes 1st Degree Relative  (ICD-V18.0) 15)  Family History of Cad Female 1st Degree Relative <60  (ICD-V16.49) 16)  Family History Breast Cancer 1st Degree Relative <50  (ICD-V16.3) 17)  Diverticulosis, Colon  (ICD-562.10) 18)  Colonic Polyps, Hx of  (ICD-V12.72) 19)  Degenerative Disc Disease, Cervical Spine  (ICD-722.4) 20)  Osteoarthritis, Lumbosacral Spine  (ICD-721.3) 21)  Gallstones  (ICD-574.20) 22)  Allergic Rhinitis  (ICD-477.9) 23)  Hyperlipidemia  (ICD-272.4) 24)  Anxiety  (ICD-300.00) 25)  Osteoporosis  (ICD-733.00) 26)  Hypertension  (ICD-401.9)  Medications Prior to Update: 1)  Actonel 150 Mg  Tabs (Risedronate Sodium) .Marland Kitchen.. 1 By Mouth Q Month 2)  Diazepam 5 Mg Tabs (Diazepam) .... Take 1 Tablet By Mouth Twice A Day As Needed 3)  Labetalol Hcl 200 Mg Tabs (Labetalol Hcl) .Marland Kitchen.. 1 Tablet By Mouth Twice A Day 4)  Spironolactone 50 Mg Tabs (Spironolactone) .... Take 1 Tablet By Mouth Twice A Day 5)  Ecotrin 325 Mg  Tbec (Aspirin) .Marland Kitchen.. 1 Po Qd  Current Medications (verified): 1)  Actonel 150 Mg  Tabs (Risedronate Sodium) .Marland Kitchen.. 1 By Mouth Q Month 2)  Diazepam 5 Mg Tabs (Diazepam) .... Take  1 Tablet By Mouth Twice A Day As Needed 3)  Labetalol Hcl 200 Mg Tabs (Labetalol Hcl) .Marland Kitchen.. 1 Tablet By Mouth Twice A Day 4)  Spironolactone 50 Mg Tabs (Spironolactone) .... Take 1 Tablet By Mouth Twice A Day 5)  Ecotrin 325 Mg  Tbec (Aspirin) .Marland Kitchen.. 1 Po Qd 6)  Tramadol Hcl 50 Mg Tabs (Tramadol Hcl) .Marland Kitchen.. 1 - 2 By Mouth Q 6 Hrs As Needed Pain  Allergies (verified): 1)  ! 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   Head:  normocephalic and atraumatic.   Eyes:  vision grossly intact, pupils equal, and pupils round.   Ears:  R ear normal and L ear normal.   Nose:  no external deformity and no nasal discharge.   Mouth:  no gingival abnormalities and pharynx pink and moist.   Neck:  supple and no masses.   Lungs:  normal respiratory effort and normal breath sounds.   Heart:  normal rate and regular rhythm.   Msk:  right shoudler FROM, NT, no swelling, rash or erythema,  states pain worse to abduction;  no spine tenderness throughout;  no c-spine paracervical tender, no trapezoid or deltoid or other msk tender palpable Extremities:  no edema, no erythema  Neurologic:  cranial nerves II-XII intact, strength normal in all upper extremities, and sensation intact to light touch.     Impression & Recommendations:  Problem # 1:  NECK PAIN, RIGHT (ICD-723.1)  Her updated medication list for this problem includes:    Ecotrin 325 Mg Tbec (Aspirin) .Marland Kitchen... 1 po qd    Tramadol Hcl 50 Mg Tabs (Tramadol hcl) .Marland Kitchen... 1 - 2 by mouth q 6 hrs as needed pain  Orders: T-Cervical Spine Comp 4 Views (72050TC) suspect pain radiating related to the shoulder, but to check c-spine film per pt request, r/o DJD, consider ortho, MRI  Problem # 2:  CHEST PAIN (ICD-786.50) again suspect related to the right shoulder, to check film per pt request, r/o subclinical disease Orders: T-2 View CXR, Same Day (71020.5TC)  Problem # 3:  SHOULDER PAIN, RIGHT (ICD-719.41)  Her updated medication list for this problem includes:    Ecotrin 325 Mg Tbec (Aspirin) .Marland Kitchen... 1 po qd    Tramadol Hcl 50  Mg Tabs (Tramadol hcl) .Marland Kitchen... 1 - 2 by mouth q 6 hrs as needed pain  Orders: Orthopedic Surgeon Referral (Ortho Surgeon) T-Shoulder Right (73030TC) exam seems realtively benign but did have pain with abduction that is significant; to check film and refer ortho  Problem # 4:  HYPERTENSION (ICD-401.9)  Her updated medication list for this problem includes:    Labetalol Hcl 200 Mg Tabs (Labetalol hcl) .Marland Kitchen... 1 tablet by mouth twice a day    Spironolactone 50 Mg Tabs (Spironolactone) .Marland Kitchen... Take 1 tablet by mouth twice a day  BP today: 154/100 Prior BP: 138/84 (08/09/2008)  Labs Reviewed: K+: 4.2 (08/06/2008) Creat: : 1.1 (08/06/2008)   Chol: 210 (08/06/2008)   HDL: 58.00 (08/06/2008)   LDL:  DEL (08/04/2007)   TG: 100.0 (08/06/2008) mild elev today, likely situational, ok to follow, continue same treatment   Complete Medication List: 1)  Actonel 150 Mg Tabs (Risedronate sodium) .Marland Kitchen.. 1 by mouth q month 2)  Diazepam 5 Mg Tabs (Diazepam) .... Take 1 tablet by mouth twice a day as needed 3)  Labetalol Hcl 200 Mg Tabs (Labetalol hcl) .Marland Kitchen.. 1 tablet by mouth twice a day 4)  Spironolactone 50 Mg Tabs (Spironolactone) .... Take 1 tablet by mouth twice a day 5)  Ecotrin 325 Mg Tbec (Aspirin) .Marland Kitchen.. 1 po qd 6)  Tramadol Hcl 50 Mg Tabs (Tramadol hcl) .Marland Kitchen.. 1 - 2 by mouth q 6 hrs as needed pain  Patient Instructions: 1)  Please go to Radiology in the basement level for your X-Ray today  2)  Please take all new medications as prescribed - the pain medication 3)  You will be contacted about the referral(s) to: orthopedic 4)  Please schedule a follow-up appointment as needed. Prescriptions: TRAMADOL HCL 50 MG TABS (TRAMADOL HCL) 1 - 2 by mouth q 6 hrs as needed pain  #60 x 2   Entered and Authorized by:   Corwin Levins MD   Signed by:   Corwin Levins MD on 04/18/2009   Method used:   Print then Give to Patient   RxID:   [id number]

## 2010-03-31 NOTE — Progress Notes (Signed)
Summary: pt request  Phone Note Call from Patient Call back at Home Phone [phone]   Caller: Patient Summary of Call: pt called stating that she has developed a cold and request ABX. I advised pt that ABX cannot be done without an OV. pt says she is too ill to come and that she was just in office. Pt states that she has received an RX for Promethazine Codeine syrup in the past and is requesting RX for this becuase per pt OTC meds don't work. please advise Initial call taken by: Margaret Pyle, CMA,  April 25, 2009 9:56 AM  Follow-up for Phone Call        needs OV, o/w can try OTC tylenol, delsym for cough and mucinex for congestion Follow-up by: Corwin Levins MD,  April 25, 2009 12:37 PM  Additional Follow-up for Phone Call Additional follow up Details #1::        Patient notified, offered office visit for monday and patient declined. Additional Follow-up by: Lucious Groves,  April 25, 2009 1:22 PM

## 2010-09-14 ENCOUNTER — Other Ambulatory Visit: Payer: Self-pay | Admitting: Internal Medicine

## 2010-09-14 ENCOUNTER — Other Ambulatory Visit: Payer: Self-pay

## 2010-09-14 DIAGNOSIS — Z0000 Encounter for general adult medical examination without abnormal findings: Secondary | ICD-10-CM

## 2010-09-17 ENCOUNTER — Encounter: Payer: Self-pay | Admitting: Internal Medicine

## 2010-09-21 ENCOUNTER — Encounter: Payer: Self-pay | Admitting: Internal Medicine

## 2010-10-09 ENCOUNTER — Other Ambulatory Visit: Payer: Self-pay

## 2010-10-09 ENCOUNTER — Other Ambulatory Visit (INDEPENDENT_AMBULATORY_CARE_PROVIDER_SITE_OTHER): Payer: Self-pay | Admitting: Internal Medicine

## 2010-10-09 ENCOUNTER — Other Ambulatory Visit (INDEPENDENT_AMBULATORY_CARE_PROVIDER_SITE_OTHER): Payer: Self-pay

## 2010-10-09 DIAGNOSIS — Z0000 Encounter for general adult medical examination without abnormal findings: Secondary | ICD-10-CM

## 2010-10-09 DIAGNOSIS — E785 Hyperlipidemia, unspecified: Secondary | ICD-10-CM

## 2010-10-09 DIAGNOSIS — Z79899 Other long term (current) drug therapy: Secondary | ICD-10-CM

## 2010-10-09 DIAGNOSIS — I38 Endocarditis, valve unspecified: Secondary | ICD-10-CM

## 2010-10-09 LAB — CBC WITH DIFFERENTIAL/PLATELET
Basophils Absolute: 0 10*3/uL (ref 0.0–0.1)
Eosinophils Absolute: 0.2 10*3/uL (ref 0.0–0.7)
HCT: 41.4 % (ref 36.0–46.0)
Lymphs Abs: 1.8 10*3/uL (ref 0.7–4.0)
MCHC: 33.6 g/dL (ref 30.0–36.0)
Monocytes Relative: 12.3 % — ABNORMAL HIGH (ref 3.0–12.0)
Platelets: 171 10*3/uL (ref 150.0–400.0)
RDW: 14 % (ref 11.5–14.6)

## 2010-10-09 LAB — URINALYSIS, ROUTINE W REFLEX MICROSCOPIC
Bilirubin Urine: NEGATIVE
Hgb urine dipstick: NEGATIVE
Nitrite: NEGATIVE
Urobilinogen, UA: 0.2 (ref 0.0–1.0)
pH: 6 (ref 5.0–8.0)

## 2010-10-09 LAB — HEPATIC FUNCTION PANEL
ALT: 30 U/L (ref 0–35)
AST: 28 U/L (ref 0–37)
Albumin: 4.3 g/dL (ref 3.5–5.2)

## 2010-10-09 LAB — BASIC METABOLIC PANEL
Calcium: 9.5 mg/dL (ref 8.4–10.5)
Creatinine, Ser: 1.1 mg/dL (ref 0.4–1.2)

## 2010-10-09 LAB — LIPID PANEL
Cholesterol: 229 mg/dL — ABNORMAL HIGH (ref 0–200)
Triglycerides: 78 mg/dL (ref 0.0–149.0)

## 2010-10-09 LAB — TSH: TSH: 0.82 u[IU]/mL (ref 0.35–5.50)

## 2010-10-12 ENCOUNTER — Telehealth: Payer: Self-pay | Admitting: Internal Medicine

## 2010-10-12 ENCOUNTER — Encounter: Payer: Self-pay | Admitting: Internal Medicine

## 2010-10-12 ENCOUNTER — Ambulatory Visit (INDEPENDENT_AMBULATORY_CARE_PROVIDER_SITE_OTHER): Payer: Medicare Other | Admitting: Internal Medicine

## 2010-10-12 VITALS — BP 130/80 | HR 77 | Temp 98.2°F | Ht 64.0 in | Wt 202.4 lb

## 2010-10-12 DIAGNOSIS — Z0001 Encounter for general adult medical examination with abnormal findings: Secondary | ICD-10-CM | POA: Insufficient documentation

## 2010-10-12 DIAGNOSIS — M79605 Pain in left leg: Secondary | ICD-10-CM | POA: Insufficient documentation

## 2010-10-12 DIAGNOSIS — Z0000 Encounter for general adult medical examination without abnormal findings: Secondary | ICD-10-CM

## 2010-10-12 DIAGNOSIS — M810 Age-related osteoporosis without current pathological fracture: Secondary | ICD-10-CM

## 2010-10-12 DIAGNOSIS — I351 Nonrheumatic aortic (valve) insufficiency: Secondary | ICD-10-CM

## 2010-10-12 DIAGNOSIS — M542 Cervicalgia: Secondary | ICD-10-CM

## 2010-10-12 DIAGNOSIS — I38 Endocarditis, valve unspecified: Secondary | ICD-10-CM

## 2010-10-12 DIAGNOSIS — M25519 Pain in unspecified shoulder: Secondary | ICD-10-CM

## 2010-10-12 DIAGNOSIS — E785 Hyperlipidemia, unspecified: Secondary | ICD-10-CM

## 2010-10-12 DIAGNOSIS — M79609 Pain in unspecified limb: Secondary | ICD-10-CM

## 2010-10-12 DIAGNOSIS — M25511 Pain in right shoulder: Secondary | ICD-10-CM

## 2010-10-12 HISTORY — DX: Nonrheumatic aortic (valve) insufficiency: I35.1

## 2010-10-12 MED ORDER — DICLOFENAC SODIUM 1 % TD GEL: 1.0000 "application " | Freq: Four times a day (QID) | TRANSDERMAL | Status: DC

## 2010-10-12 MED ORDER — DIAZEPAM 5 MG PO TABS: 5.0000 mg | ORAL_TABLET | Freq: Two times a day (BID) | ORAL | Status: DC | PRN

## 2010-10-12 NOTE — Assessment & Plan Note (Signed)
Uncontrolled, pt delcines statin, prefers diet despite discussion today regarding risk of stroke and heart disesae

## 2010-10-12 NOTE — Telephone Encounter (Signed)
Done hardcopy to dahlia/LIM B  

## 2010-10-12 NOTE — Progress Notes (Signed)
Subjective:    Patient ID: Megan Baldwin, female    DOB: [birth year], 77 y.o.   MRN: [id number]  HPI Here for wellness and f/u;  Overall doing ok;  Pt denies CP, worsening SOB, DOE, wheezing, orthopnea, PND, worsening LE edema, palpitations, dizziness or syncope.  Pt denies neurological change such as new Headache, facial or extremity weakness.  Pt denies polydipsia, polyuria, or low sugar symptoms. Pt states overall good compliance with treatment and medications, good tolerability, and trying to follow lower cholesterol diet.  Pt denies worsening depressive symptoms, suicidal ideation or panic. No fever, wt loss, night sweats, loss of appetite, or other constitutional symptoms.  Pt states good ability with ADL's, low fall risk, home safety reviewed and adequate, no significant changes in hearing or vision, and occasionally active with exercise.  Asks for f/u echo with hx of valve leakage. Tends to perspire easily, especialy at church and gym on Mon and Wed, sometimes saturdays.  Does have ongoing right shoudler pain with pain sometimes starting at the right neck, as well more recent worsening left hip giving away to get up in the AM Past Medical History  Diagnosis Date  . Hypertension   . Osteoporosis   . Anxiety   . Hyperlipidemia   . COPD (chronic obstructive pulmonary disease)   . Allergic rhinitis   . Gallstones   . DJD (degenerative joint disease)     LS spine  . Disc disease, degenerative, cervical   . History of colonic polyps   . Diverticulosis of colon    Past Surgical History  Procedure Date  . Abdominal hysterectomy   . Oophorectomy   . Colonoscopy w/ polypectomy     reports that she has quit smoking. She does not have any smokeless tobacco history on file. She reports that she does not drink alcohol or use illicit drugs. family history includes Cancer in an unspecified family member; Coronary artery disease in an unspecified family member; Diabetes in an unspecified family  member; Hypertension in an unspecified family member; Parkinsonism in her father; and Stroke in an unspecified family member. No Known Allergies Current Outpatient Prescriptions on File Prior to Visit  Medication Sig Dispense Refill  . aspirin 325 MG EC tablet Take 325 mg by mouth daily.        . diazepam (VALIUM) 5 MG tablet Take 5 mg by mouth 2 (two) times daily as needed.        . labetalol (NORMODYNE) 200 MG tablet Take 200 mg by mouth 2 (two) times daily.        Marland Kitchen spironolactone (ALDACTONE) 50 MG tablet Take 50 mg by mouth 2 (two) times daily.        Marland Kitchen HYDROcodone-homatropine (HYCODAN) 5-1.5 MG/5ML syrup Take by mouth every 6 (six) hours as needed.        . traMADol (ULTRAM) 50 MG tablet Take 50-100 mg by mouth every 6 (six) hours as needed.         Review of Systems Review of Systems  Constitutional: Negative for diaphoresis, activity change, appetite change and unexpected weight change.  HENT: Negative for hearing loss, ear pain, facial swelling, mouth sores and neck stiffness.   Eyes: Negative for pain, redness and visual disturbance.  Respiratory: Negative for shortness of breath and wheezing.   Cardiovascular: Negative for chest pain and palpitations.  Gastrointestinal: Negative for diarrhea, blood in stool, abdominal distention and rectal pain.  Genitourinary: Negative for hematuria, flank pain and decreased urine volume.  Musculoskeletal: Negative for  myalgias and joint swelling.  Skin: Negative for color change and wound.  Neurological: Negative for syncope and numbness.  Hematological: Negative for adenopathy.  Psychiatric/Behavioral: Negative for hallucinations, self-injury, decreased concentration and agitation.      Objective:   Physical Exam BP 130/80  Pulse 77  Temp(Src) 98.2 F (36.8 C) (Oral)  Ht 5\' 4"  (1.626 m)  Wt 202 lb 6 oz (91.797 kg)  BMI 34.74 kg/m2  SpO2 95% Physical Exam  VS noted Constitutional: Pt is oriented to person, place, and time. Appears  well-developed and well-nourished.  HENT:  Head: Normocephalic and atraumatic.  Right Ear: External ear normal.  Left Ear: External ear normal.  Nose: Nose normal. .  Musculoskeletal: Normal range of motion. Exhibits no edema. left groin without mass, but mild tender, left leg o/w neurovasc intact, no ROM tender with hip manipulation;  Right shoulder diffuse mild tender Mouth/Throat: Oropharynx is clear and moist.  Eyes: Conjunctivae and EOM are normal. Pupils are equal, round, and reactive to light.  Neck: Normal range of motion. Neck supple. No JVD present. No tracheal deviation present.  Cardiovascular: Normal rate, regular rhythm, normal heart sounds and intact distal pulses.   Pulmonary/Chest: Effort normal and breath sounds normal.  Abdominal: Soft. Bowel sounds are normal. There is no tendernessLymphadenopathy:  Has no cervical adenopathy.  Neurological: Pt is alert and oriented to person, place, and time. Pt has normal reflexes. No cranial nerve deficit. Motor/sens/dtr intact Skin: Skin is warm and dry. No rash noted.  Psychiatric:  Has  normal mood and affect. Behavior is normal.     Assessment & Plan:

## 2010-10-12 NOTE — Assessment & Plan Note (Signed)
I suspect groin strain with most recent sexual activity,  to f/u any worsening symptoms or concerns

## 2010-10-12 NOTE — Assessment & Plan Note (Signed)
Most recent dxa improved, ok to hold the actonel for now

## 2010-10-12 NOTE — Patient Instructions (Addendum)
You will be contacted regarding the referral for: echocardiogram, and orthopedic for the shoulder and leg Continue all other medications as before Please follow low cholesterol diet Take all new medications as prescribed - the voltaren gel Continue all other medications as before Please return in 1 year for your yearly visit, or sooner if needed, with Lab testing done 3-5 days before

## 2010-10-12 NOTE — Assessment & Plan Note (Signed)
?   rotater cuff vs right neck radicular pain (or both?)  - for voltaren gel,  to f/u any worsening symptoms or concerns

## 2010-10-12 NOTE — Assessment & Plan Note (Signed)
Per pt hx - for echo f/u

## 2010-10-12 NOTE — Assessment & Plan Note (Signed)

## 2010-10-13 NOTE — Telephone Encounter (Signed)
Rx faxed to pharmacy by Zella Ball CMA

## 2010-10-23 ENCOUNTER — Ambulatory Visit (HOSPITAL_COMMUNITY): Payer: Medicare Other | Attending: Internal Medicine | Admitting: Radiology

## 2010-10-23 DIAGNOSIS — J4489 Other specified chronic obstructive pulmonary disease: Secondary | ICD-10-CM | POA: Insufficient documentation

## 2010-10-23 DIAGNOSIS — I38 Endocarditis, valve unspecified: Secondary | ICD-10-CM

## 2010-10-23 DIAGNOSIS — I10 Essential (primary) hypertension: Secondary | ICD-10-CM | POA: Insufficient documentation

## 2010-10-23 DIAGNOSIS — I080 Rheumatic disorders of both mitral and aortic valves: Secondary | ICD-10-CM | POA: Insufficient documentation

## 2010-10-23 DIAGNOSIS — J449 Chronic obstructive pulmonary disease, unspecified: Secondary | ICD-10-CM | POA: Insufficient documentation

## 2010-10-23 DIAGNOSIS — E785 Hyperlipidemia, unspecified: Secondary | ICD-10-CM | POA: Insufficient documentation

## 2010-10-23 DIAGNOSIS — R011 Cardiac murmur, unspecified: Secondary | ICD-10-CM

## 2010-10-23 DIAGNOSIS — M7989 Other specified soft tissue disorders: Secondary | ICD-10-CM | POA: Insufficient documentation

## 2010-10-27 ENCOUNTER — Other Ambulatory Visit: Payer: Self-pay | Admitting: Internal Medicine

## 2010-11-17 ENCOUNTER — Ambulatory Visit: Payer: Medicare Other | Admitting: Internal Medicine

## 2011-01-14 ENCOUNTER — Ambulatory Visit: Payer: Medicare Other

## 2011-01-18 ENCOUNTER — Ambulatory Visit (INDEPENDENT_AMBULATORY_CARE_PROVIDER_SITE_OTHER): Payer: Medicare Other

## 2011-01-18 DIAGNOSIS — Z23 Encounter for immunization: Secondary | ICD-10-CM

## 2011-03-30 ENCOUNTER — Other Ambulatory Visit: Payer: Self-pay

## 2011-03-30 MED ORDER — DIAZEPAM 5 MG PO TABS: 5.0000 mg | ORAL_TABLET | Freq: Two times a day (BID) | ORAL | Status: DC | PRN

## 2011-03-30 NOTE — Telephone Encounter (Signed)
Done hardcopy to robin  

## 2011-03-30 NOTE — Telephone Encounter (Signed)
Faxed hardcopy to pharmacy. 

## 2011-05-05 ENCOUNTER — Other Ambulatory Visit (HOSPITAL_COMMUNITY): Payer: Self-pay | Admitting: Cardiology

## 2011-05-19 ENCOUNTER — Other Ambulatory Visit (HOSPITAL_COMMUNITY): Payer: Medicare Other

## 2011-06-15 ENCOUNTER — Other Ambulatory Visit: Payer: Self-pay

## 2011-06-15 MED ORDER — SPIRONOLACTONE 50 MG PO TABS: 50.0000 mg | ORAL_TABLET | Freq: Two times a day (BID) | ORAL | Status: DC

## 2011-06-17 ENCOUNTER — Other Ambulatory Visit: Payer: Self-pay

## 2011-06-17 MED ORDER — LABETALOL HCL 200 MG PO TABS: 200.0000 mg | ORAL_TABLET | Freq: Two times a day (BID) | ORAL | Status: DC

## 2011-08-25 ENCOUNTER — Other Ambulatory Visit: Payer: Self-pay

## 2011-08-25 ENCOUNTER — Encounter (HOSPITAL_COMMUNITY)
Admission: RE | Admit: 2011-08-25 | Discharge: 2011-08-25 | Disposition: A | Discharge status: Home / Self Care | Payer: Medicare Other | Source: Ambulatory Visit | Attending: Cardiology | Admitting: Cardiology

## 2011-08-25 ENCOUNTER — Ambulatory Visit (HOSPITAL_COMMUNITY)
Admission: RE | Admit: 2011-08-25 | Discharge: 2011-08-25 | Disposition: A | Discharge status: Home / Self Care | Payer: Medicare Other | Source: Ambulatory Visit | Attending: Cardiology | Admitting: Cardiology

## 2011-08-25 DIAGNOSIS — R079 Chest pain, unspecified: Secondary | ICD-10-CM | POA: Insufficient documentation

## 2011-08-25 MED ORDER — TECHNETIUM TC 99M TETROFOSMIN IV KIT
30.0000 | PACK | Freq: Once | INTRAVENOUS | Status: AC | PRN
  Administered 2011-08-25: 30 via INTRAVENOUS

## 2011-08-25 MED ORDER — REGADENOSON 0.4 MG/5ML IV SOLN
0.4000 mg | Freq: Once | INTRAVENOUS | Status: AC
  Administered 2011-08-25: 0.4 mg via INTRAVENOUS

## 2011-08-25 MED ORDER — TECHNETIUM TC 99M TETROFOSMIN IV KIT
10.0000 | PACK | Freq: Once | INTRAVENOUS | Status: AC | PRN
  Administered 2011-08-25: 10 via INTRAVENOUS

## 2011-08-25 MED ORDER — REGADENOSON 0.4 MG/5ML IV SOLN
INTRAVENOUS | Status: AC
  Filled 2011-08-25: qty 5

## 2011-09-24 ENCOUNTER — Telehealth: Payer: Self-pay

## 2011-09-24 DIAGNOSIS — Z0000 Encounter for general adult medical examination without abnormal findings: Secondary | ICD-10-CM

## 2011-09-24 NOTE — Telephone Encounter (Signed)
Put order in for labs. 

## 2011-09-27 ENCOUNTER — Other Ambulatory Visit (INDEPENDENT_AMBULATORY_CARE_PROVIDER_SITE_OTHER): Payer: Medicare Other

## 2011-09-27 DIAGNOSIS — E785 Hyperlipidemia, unspecified: Secondary | ICD-10-CM

## 2011-09-27 DIAGNOSIS — Z0000 Encounter for general adult medical examination without abnormal findings: Secondary | ICD-10-CM

## 2011-09-27 LAB — LIPID PANEL: HDL: 72 mg/dL (ref >39.00)

## 2011-09-27 LAB — URINALYSIS, ROUTINE W REFLEX MICROSCOPIC
Bilirubin Urine: NEGATIVE
Ketones, ur: NEGATIVE
Total Protein, Urine: NEGATIVE
pH: 6 (ref 5.0–8.0)

## 2011-09-27 LAB — BASIC METABOLIC PANEL
BUN: 16 mg/dL (ref 6–23)
Calcium: 9.7 mg/dL (ref 8.4–10.5)
Creatinine, Ser: 1.1 mg/dL (ref 0.4–1.2)
GFR: 59.54 mL/min — ABNORMAL LOW (ref >60.00)
Glucose, Bld: 91 mg/dL (ref 70–99)
Sodium: 139 mEq/L (ref 135–145)

## 2011-09-27 LAB — CBC WITH DIFFERENTIAL/PLATELET
Eosinophils Relative: 4.2 % (ref 0.0–5.0)
HCT: 38.5 % (ref 36.0–46.0)
Hemoglobin: 12.6 g/dL (ref 12.0–15.0)
Lymphocytes Relative: 49.9 % — ABNORMAL HIGH (ref 12.0–46.0)
Lymphs Abs: 2.3 10*3/uL (ref 0.7–4.0)
Monocytes Relative: 12.6 % — ABNORMAL HIGH (ref 3.0–12.0)
Neutro Abs: 1.5 10*3/uL (ref 1.4–7.7)
Platelets: 158 10*3/uL (ref 150.0–400.0)
WBC: 4.6 10*3/uL (ref 4.5–10.5)

## 2011-09-27 LAB — TSH: TSH: 0.74 u[IU]/mL (ref 0.35–5.50)

## 2011-09-27 LAB — HEPATIC FUNCTION PANEL
ALT: 29 U/L (ref 0–35)
AST: 33 U/L (ref 0–37)
Alkaline Phosphatase: 62 U/L (ref 39–117)
Total Bilirubin: 0.7 mg/dL (ref 0.3–1.2)

## 2011-09-28 ENCOUNTER — Encounter: Payer: Self-pay | Admitting: Internal Medicine

## 2011-09-28 ENCOUNTER — Ambulatory Visit (INDEPENDENT_AMBULATORY_CARE_PROVIDER_SITE_OTHER): Payer: Medicare Other | Admitting: Internal Medicine

## 2011-09-28 VITALS — BP 122/80 | HR 71 | Temp 98.1°F | Ht 64.0 in | Wt 199.0 lb

## 2011-09-28 DIAGNOSIS — I10 Essential (primary) hypertension: Secondary | ICD-10-CM

## 2011-09-28 DIAGNOSIS — Z0000 Encounter for general adult medical examination without abnormal findings: Secondary | ICD-10-CM

## 2011-09-28 DIAGNOSIS — J309 Allergic rhinitis, unspecified: Secondary | ICD-10-CM

## 2011-09-28 DIAGNOSIS — M542 Cervicalgia: Secondary | ICD-10-CM | POA: Insufficient documentation

## 2011-09-28 MED ORDER — DICLOFENAC SODIUM 1 % TD GEL: 1.0000 "application " | Freq: Four times a day (QID) | TRANSDERMAL | Status: DC

## 2011-09-28 MED ORDER — FEXOFENADINE HCL 180 MG PO TABS: 180.0000 mg | ORAL_TABLET | Freq: Every day | ORAL | Status: DC

## 2011-09-28 MED ORDER — SPIRONOLACTONE 50 MG PO TABS: 50.0000 mg | ORAL_TABLET | Freq: Two times a day (BID) | ORAL | Status: DC

## 2011-09-28 MED ORDER — DIAZEPAM 5 MG PO TABS: 5.0000 mg | ORAL_TABLET | Freq: Two times a day (BID) | ORAL | Status: DC | PRN

## 2011-09-28 MED ORDER — LABETALOL HCL 200 MG PO TABS: 200.0000 mg | ORAL_TABLET | Freq: Two times a day (BID) | ORAL | Status: DC

## 2011-09-28 NOTE — Assessment & Plan Note (Signed)

## 2011-09-28 NOTE — Assessment & Plan Note (Addendum)
stable overall by hx and exam, most recent data reviewed with pt, and pt to continue medical treatment as before BP Readings from Last 3 Encounters:  09/28/11 122/80  08/25/11 144/86  10/12/10 130/80

## 2011-09-28 NOTE — Assessment & Plan Note (Signed)
For allegra prn 

## 2011-09-28 NOTE — Assessment & Plan Note (Signed)
For ortho referral, Continue all other medications as before

## 2011-09-28 NOTE — Progress Notes (Signed)
Subjective:    Patient ID: Megan Baldwin, female    DOB: 26-Aug-1933, 78 y.o.   MRN: [id number]  HPI  Here for wellness and f/u;  Overall doing ok;  Pt denies CP, worsening SOB, DOE, wheezing, orthopnea, PND, worsening LE edema, palpitations, dizziness or syncope.  Pt denies neurological change such as new Headache, facial or extremity weakness.  Pt denies polydipsia, polyuria, or low sugar symptoms. Pt states overall good compliance with treatment and medications, good tolerability, and trying to follow lower cholesterol diet.  Pt denies worsening depressive symptoms, suicidal ideation or panic. No fever, wt loss, night sweats, loss of appetite, or other constitutional symptoms.  Pt states good ability with ADL's, low fall risk, home safety reviewed and adequate, no significant changes in hearing or vision, and occasionally active with exercise.  Does have intermittent right neck pain with radiation to RUE to arm without numb/weakness.  Has occas hot flushes and anxiety.  Does have several wks ongoing nasal allergy symptoms with clear congestion, itch and sneeze, without fever, pain, ST, cough or wheezing. Past Medical History  Diagnosis Date  . Hypertension   . Osteoporosis   . Anxiety   . Hyperlipidemia   . COPD (chronic obstructive pulmonary disease)   . Allergic rhinitis   . Gallstones   . DJD (degenerative joint disease)     LS spine  . Disc disease, degenerative, cervical   . History of colonic polyps   . Diverticulosis of colon    Past Surgical History  Procedure Date  . Abdominal hysterectomy   . Oophorectomy   . Colonoscopy w/ polypectomy     reports that she has quit smoking. She does not have any smokeless tobacco history on file. She reports that she does not drink alcohol or use illicit drugs. family history includes Cancer in an unspecified family member; Coronary artery disease in an unspecified family member; Diabetes in an unspecified family member; Hypertension in an  unspecified family member; Parkinsonism in her father; and Stroke in an unspecified family member. No Known Allergies Current Outpatient Prescriptions on File Prior to Visit  Medication Sig Dispense Refill  . aspirin 325 MG EC tablet Take 325 mg by mouth daily.        . traMADol (ULTRAM) 50 MG tablet Take 50-100 mg by mouth every 6 (six) hours as needed.        Marland Kitchen DISCONTD: labetalol (NORMODYNE) 200 MG tablet Take 1 tablet (200 mg total) by mouth 2 (two) times daily.  60 tablet  5  . DISCONTD: spironolactone (ALDACTONE) 50 MG tablet Take 1 tablet (50 mg total) by mouth 2 (two) times daily.  60 tablet  4  . fexofenadine (ALLEGRA) 180 MG tablet Take 1 tablet (180 mg total) by mouth daily.  90 tablet  3   Review of Systems Review of Systems  Constitutional: Negative for diaphoresis, activity change, appetite change and unexpected weight change.  HENT: Negative for hearing loss, ear pain, facial swelling, mouth sores and neck stiffness.   Eyes: Negative for pain, redness and visual disturbance.  Respiratory: Negative for shortness of breath and wheezing.   Cardiovascular: Negative for chest pain and palpitations.  Gastrointestinal: Negative for diarrhea, blood in stool, abdominal distention and rectal pain.  Genitourinary: Negative for hematuria, flank pain and decreased urine volume.  Musculoskeletal: Negative for myalgias and joint swelling.  Skin: Negative for color change and wound.  Neurological: Negative for syncope and numbness. except for the above Hematological: Negative for adenopathy.  Psychiatric/Behavioral:  Negative for hallucinations, self-injury, decreased concentration and agitation.      Objective:   Physical Exam BP 122/80  Pulse 71  Temp 98.1 F (36.7 C) (Oral)  Ht 5\' 4"  (1.626 m)  Wt 199 lb (90.266 kg)  BMI 34.16 kg/m2  SpO2 96% Physical Exam  VS noted Constitutional: Pt is oriented to person, place, and time. Appears well-developed and well-nourished.  HENT:    Head: Normocephalic and atraumatic.  Right Ear: External ear normal.  Left Ear: External ear normal.  Nose: Nose normal.  Mouth/Throat: Oropharynx is clear and moist.  Bilat tm's mild erythema.  Sinus nontender.  Pharynx mild erythema Eyes: Conjunctivae and EOM are normal. Pupils are equal, round, and reactive to light.  Neck: Normal range of motion. Neck supple. No JVD present. No tracheal deviation present.  Cardiovascular: Normal rate, regular rhythm, normal heart sounds and intact distal pulses.   Pulmonary/Chest: Effort normal and breath sounds normal.  Abdominal: Soft. Bowel sounds are normal. There is no tenderness.  Musculoskeletal: Normal range of motion. Exhibits no edema.  Lymphadenopathy:  Has no cervical adenopathy.  Neurological: Pt is alert and oriented to person, place, and time. Pt has normal reflexes. No cranial nerve deficit. Motor/gait intact Skin: Skin is warm and dry. No rash noted.  Psychiatric: 1-2+ nervous, Behavior is normal.     Assessment & Plan:

## 2011-09-28 NOTE — Patient Instructions (Addendum)
Continue all other medications as before Your refills were done as requested, and the allegra given for the sinus allergies You will be contacted regarding the referral for: orthopedic Please return in 6 mo with Lab testing done 3-5 days before

## 2011-10-12 ENCOUNTER — Telehealth: Payer: Self-pay | Admitting: Internal Medicine

## 2011-10-12 ENCOUNTER — Other Ambulatory Visit: Payer: Self-pay

## 2011-10-12 MED ORDER — DIAZEPAM 5 MG PO TABS: 5.0000 mg | ORAL_TABLET | Freq: Two times a day (BID) | ORAL | Status: DC | PRN

## 2011-10-12 NOTE — Telephone Encounter (Signed)
Valium refill too soon, just done July 30 for total 6 mo

## 2011-10-12 NOTE — Telephone Encounter (Signed)
Pharmacy informed and they will inform the patient.

## 2011-10-12 NOTE — Telephone Encounter (Signed)
Megan Baldwin called to say she cannot find the Valium RX that was given to her on July 30.  She says this has always been called in to the pharmacy in the past.

## 2011-10-12 NOTE — Telephone Encounter (Signed)
Done hardcopy to robin  

## 2011-10-13 NOTE — Telephone Encounter (Signed)
Called the patient informed prescription requested is being faxed this am to pharmacy.

## 2011-11-25 ENCOUNTER — Ambulatory Visit (INDEPENDENT_AMBULATORY_CARE_PROVIDER_SITE_OTHER): Payer: Medicare Other

## 2011-11-25 DIAGNOSIS — Z23 Encounter for immunization: Secondary | ICD-10-CM

## 2011-12-28 ENCOUNTER — Other Ambulatory Visit: Payer: Self-pay | Admitting: Internal Medicine

## 2011-12-28 DIAGNOSIS — Z1231 Encounter for screening mammogram for malignant neoplasm of breast: Secondary | ICD-10-CM

## 2012-01-18 ENCOUNTER — Ambulatory Visit (HOSPITAL_COMMUNITY)
Admission: RE | Admit: 2012-01-18 | Discharge: 2012-01-18 | Disposition: A | Discharge status: Home / Self Care | Payer: Medicare Other | Source: Ambulatory Visit | Attending: Internal Medicine | Admitting: Internal Medicine

## 2012-01-18 DIAGNOSIS — Z1231 Encounter for screening mammogram for malignant neoplasm of breast: Secondary | ICD-10-CM | POA: Insufficient documentation

## 2012-02-07 LAB — HM MAMMOGRAPHY

## 2012-03-27 ENCOUNTER — Other Ambulatory Visit (INDEPENDENT_AMBULATORY_CARE_PROVIDER_SITE_OTHER): Payer: Medicare Other

## 2012-03-27 DIAGNOSIS — I10 Essential (primary) hypertension: Secondary | ICD-10-CM

## 2012-03-27 LAB — BASIC METABOLIC PANEL
BUN: 15 mg/dL (ref 6–23)
Calcium: 9.8 mg/dL (ref 8.4–10.5)
Creatinine, Ser: 1.1 mg/dL (ref 0.4–1.2)
GFR: 61.32 mL/min (ref >60.00)

## 2012-03-27 LAB — LIPID PANEL
Cholesterol: 214 mg/dL — ABNORMAL HIGH (ref 0–200)
HDL: 57.9 mg/dL (ref >39.00)
Total CHOL/HDL Ratio: 4
Triglycerides: 123 mg/dL (ref 0.0–149.0)
VLDL: 24.6 mg/dL (ref 0.0–40.0)

## 2012-03-27 LAB — LDL CHOLESTEROL, DIRECT: Direct LDL: 116.1 mg/dL

## 2012-03-27 LAB — HEPATIC FUNCTION PANEL
Bilirubin, Direct: 0.1 mg/dL (ref 0.0–0.3)
Total Bilirubin: 0.6 mg/dL (ref 0.3–1.2)

## 2012-03-28 ENCOUNTER — Ambulatory Visit: Payer: Medicare Other | Admitting: Internal Medicine

## 2012-03-31 ENCOUNTER — Encounter: Payer: Self-pay | Admitting: Internal Medicine

## 2012-03-31 ENCOUNTER — Ambulatory Visit (INDEPENDENT_AMBULATORY_CARE_PROVIDER_SITE_OTHER)
Admission: RE | Admit: 2012-03-31 | Discharge: 2012-03-31 | Disposition: A | Discharge status: Home / Self Care | Payer: Medicare Other | Source: Ambulatory Visit | Attending: Internal Medicine | Admitting: Internal Medicine

## 2012-03-31 ENCOUNTER — Ambulatory Visit (INDEPENDENT_AMBULATORY_CARE_PROVIDER_SITE_OTHER): Payer: Medicare Other | Admitting: Internal Medicine

## 2012-03-31 VITALS — BP 128/90 | HR 73 | Temp 98.2°F | Ht 63.0 in | Wt 198.0 lb

## 2012-03-31 DIAGNOSIS — M25559 Pain in unspecified hip: Secondary | ICD-10-CM

## 2012-03-31 DIAGNOSIS — E785 Hyperlipidemia, unspecified: Secondary | ICD-10-CM

## 2012-03-31 DIAGNOSIS — M79609 Pain in unspecified limb: Secondary | ICD-10-CM

## 2012-03-31 DIAGNOSIS — Z0000 Encounter for general adult medical examination without abnormal findings: Secondary | ICD-10-CM

## 2012-03-31 DIAGNOSIS — I10 Essential (primary) hypertension: Secondary | ICD-10-CM

## 2012-03-31 DIAGNOSIS — M79605 Pain in left leg: Secondary | ICD-10-CM

## 2012-03-31 DIAGNOSIS — M25552 Pain in left hip: Secondary | ICD-10-CM | POA: Insufficient documentation

## 2012-03-31 NOTE — Patient Instructions (Addendum)
Your EKG was OK today You are given the lab work results today Please continue all other medications as before, and refills have been done if requested. Please continue your efforts at being more active, low cholesterol diet, and weight control. Please have the pharmacy call with any other refills you may need. Please remember to sign up for My Chart if you have not done so, as this will be important to you in the future with finding out test results, communicating by private email, and scheduling acute appointments online when needed. You will be contacted regarding the referral for: leg artery ultrasound test Please go to the XRAY Department in the Basement (go straight as you get off the elevator) for the x-ray testing Please return in 1 year for your yearly visit, or sooner if needed, with Lab testing done 3-5 days before

## 2012-03-31 NOTE — Progress Notes (Signed)
Subjective:    Patient ID: Megan Baldwin, female    DOB: [birth year], 79 y.o.   MRN: [id number]  HPI Here to f/u; overall doing ok,  Pt denies chest pain, increased sob or doe, wheezing, orthopnea, PND, increased LE swelling, palpitations, dizziness or syncope.  Pt denies polydipsia, polyuria, or low sugar symptoms such as weakness or confusion improved with po intake.  Pt denies new neurological symptoms such as new headache, or facial or extremity weakness or numbness.   Pt states overall good compliance with meds, has been trying to follow lower cholesterol diet, with wt overall stable,  but little exercise however. Did have some calf pain yesterday better with heating pad, resolved today but has had pain with ambulation as well in the past month to the same area.  No rest pain otherwise.  Did also have CP episode, saw card (Dr Sharyn Lull)  - neg for acute.  Also c/o recurring pain to the left groin and lower back for several months, only occurs with walking, mild, but no bowel or bladder change, fever, wt loss,  worsening LE pain/numbness/weakness, gait change or falls. Past Medical History  Diagnosis Date  . Hypertension   . Osteoporosis   . Anxiety   . Hyperlipidemia   . COPD (chronic obstructive pulmonary disease)   . Allergic rhinitis   . Gallstones   . DJD (degenerative joint disease)     LS spine  . Disc disease, degenerative, cervical   . History of colonic polyps   . Diverticulosis of colon    Past Surgical History  Procedure Date  . Abdominal hysterectomy   . Oophorectomy   . Colonoscopy w/ polypectomy     reports that she has quit smoking. She does not have any smokeless tobacco history on file. She reports that she does not drink alcohol or use illicit drugs. family history includes Cancer in an unspecified family member; Coronary artery disease in an unspecified family member; Diabetes in an unspecified family member; Hypertension in an unspecified family member; Parkinsonism  in her father; and Stroke in an unspecified family member. No Known Allergies Current Outpatient Prescriptions on File Prior to Visit  Medication Sig Dispense Refill  . aspirin 325 MG EC tablet Take 325 mg by mouth daily.        . diazepam (VALIUM) 5 MG tablet Take 1 tablet (5 mg total) by mouth 2 (two) times daily as needed.  60 tablet  5  . diclofenac sodium (VOLTAREN) 1 % GEL Apply 1 application topically 4 (four) times daily.  1 Tube  11  . fexofenadine (ALLEGRA) 180 MG tablet Take 1 tablet (180 mg total) by mouth daily.  90 tablet  3  . labetalol (NORMODYNE) 200 MG tablet Take 1 tablet (200 mg total) by mouth 2 (two) times daily.  180 tablet  3  . spironolactone (ALDACTONE) 50 MG tablet Take 1 tablet (50 mg total) by mouth 2 (two) times daily.  180 tablet  3  . traMADol (ULTRAM) 50 MG tablet Take 50-100 mg by mouth every 6 (six) hours as needed.         Review of Systems  Constitutional: Negative for unexpected weight change, or unusual diaphoresis  HENT: Negative for tinnitus.   Eyes: Negative for photophobia and visual disturbance.  Respiratory: Negative for choking and stridor.   Gastrointestinal: Negative for vomiting and blood in stool.  Genitourinary: Negative for hematuria and decreased urine volume.  Musculoskeletal: Negative for acute joint swelling Skin: Negative for color  change and wound.  Neurological: Negative for tremors and numbness other than noted  Psychiatric/Behavioral: Negative for decreased concentration or  hyperactivity.       Objective:   Physical Exam BP 128/90  Pulse 73  Temp 98.2 F (36.8 C) (Oral)  Ht 5\' 3"  (1.6 m)  Wt 198 lb (89.812 kg)  BMI 35.07 kg/m2  SpO2 97% VS noted,  Constitutional: Pt appears well-developed and well-nourished.  HENT: Head: NCAT.  Right Ear: External ear normal.  Left Ear: External ear normal.  Eyes: Conjunctivae and EOM are normal. Pupils are equal, round, and reactive to light.  Neck: Normal range of motion. Neck  supple.  Cardiovascular: Normal rate and regular rhythm.   Pulmonary/Chest: Effort normal and breath sounds normal.  Neurological: Pt is alert. Not confused  Dorsalis pedis 1+ bilat, left calf nontender/nonswollen Skin: Skin is warm. No erythema. No LE edema Psychiatric: Pt behavior is normal. Thought content normal.     Assessment & Plan:

## 2012-03-31 NOTE — Assessment & Plan Note (Addendum)
ECG reviewed as per emr, stable overall by history and exam, recent data reviewed with pt, and pt to continue medical treatment as before,  to f/u any worsening symptoms or concerns BP Readings from Last 3 Encounters:  03/31/12 128/90  09/28/11 122/80  08/25/11 144/86

## 2012-04-01 ENCOUNTER — Encounter: Payer: Self-pay | Admitting: Internal Medicine

## 2012-04-01 ENCOUNTER — Other Ambulatory Visit: Payer: Self-pay | Admitting: Internal Medicine

## 2012-04-01 NOTE — Assessment & Plan Note (Signed)
Most c/w prob left hip DJD, possible lumbar facet like pain as well, for films today,  to f/u any worsening symptoms or concerns

## 2012-04-01 NOTE — Assessment & Plan Note (Signed)
stable overall by history and exam, recent data reviewed with pt, and pt to continue medical treatment as before,  to f/u any worsening symptoms or concerns Lab Results  Component Value Date   CHOL 214* 03/27/2012   HDL 57.90 03/27/2012   LDLDIRECT 116.1 03/27/2012   TRIG 123.0 03/27/2012   CHOLHDL 4 03/27/2012   Declines statin

## 2012-04-01 NOTE — Assessment & Plan Note (Signed)
?   Claudication vs msk - for LE arterial doppelrs

## 2012-04-12 ENCOUNTER — Other Ambulatory Visit: Payer: Self-pay | Admitting: Internal Medicine

## 2012-04-12 NOTE — Telephone Encounter (Signed)
Faxed hardcopy to pharmacy. 

## 2012-04-12 NOTE — Telephone Encounter (Signed)
Done hardcopy to robin  

## 2012-09-15 ENCOUNTER — Other Ambulatory Visit: Payer: Self-pay | Admitting: Internal Medicine

## 2012-11-24 ENCOUNTER — Telehealth: Payer: Self-pay | Admitting: Internal Medicine

## 2012-11-24 NOTE — Telephone Encounter (Signed)
Pt called stated Dr. Jonny Ruiz refer her to get an MRI done last year, but pt never schedule an appt. Pt is now request information for this referral. Do not see referral for for an MRI from last year. Please advise.

## 2012-11-24 NOTE — Telephone Encounter (Signed)
Unfort this will need a repeat exam to determine if the MRI is needed, since it has been so long since the original exam (jan 2014)

## 2012-11-28 NOTE — Telephone Encounter (Signed)
Called the patient left detailed message of MD instructions

## 2012-11-29 ENCOUNTER — Ambulatory Visit (INDEPENDENT_AMBULATORY_CARE_PROVIDER_SITE_OTHER): Payer: Medicare Other

## 2012-11-29 DIAGNOSIS — Z23 Encounter for immunization: Secondary | ICD-10-CM

## 2012-12-20 ENCOUNTER — Ambulatory Visit: Payer: Medicare Other | Admitting: Internal Medicine

## 2012-12-21 ENCOUNTER — Encounter: Payer: Self-pay | Admitting: Internal Medicine

## 2012-12-21 ENCOUNTER — Ambulatory Visit (INDEPENDENT_AMBULATORY_CARE_PROVIDER_SITE_OTHER): Payer: Medicare Other | Admitting: Internal Medicine

## 2012-12-21 VITALS — BP 130/86 | HR 73 | Temp 97.7°F | Ht 64.0 in | Wt 200.0 lb

## 2012-12-21 DIAGNOSIS — M79602 Pain in left arm: Secondary | ICD-10-CM

## 2012-12-21 DIAGNOSIS — M25512 Pain in left shoulder: Secondary | ICD-10-CM | POA: Insufficient documentation

## 2012-12-21 DIAGNOSIS — Z23 Encounter for immunization: Secondary | ICD-10-CM

## 2012-12-21 DIAGNOSIS — M79609 Pain in unspecified limb: Secondary | ICD-10-CM

## 2012-12-21 DIAGNOSIS — M25551 Pain in right hip: Secondary | ICD-10-CM | POA: Insufficient documentation

## 2012-12-21 DIAGNOSIS — M25559 Pain in unspecified hip: Secondary | ICD-10-CM

## 2012-12-21 MED ORDER — DIAZEPAM 5 MG PO TABS: ORAL_TABLET | ORAL | Status: DC

## 2012-12-21 MED ORDER — PREDNISONE 10 MG PO TABS: ORAL_TABLET | ORAL | Status: DC

## 2012-12-21 NOTE — Progress Notes (Signed)
Subjective:    Patient ID: Megan Baldwin, female    DOB: [birth year], 79 y.o.   MRN: [id number]  HPI  Here with acute onset LUE tingling involving the arm from the shoulder to the fingers, assoc it seems with ongoing ? Maybe worsening chronic daily neck pain, but without LUE pain or weakness otherwise. No prior c-spine imaging or surgical eval or tx.  Worried about her heart but Pt denies chest pain, increased sob or doe, wheezing, orthopnea, PND, increased LE swelling, palpitations, dizziness or syncope. Pt denies other new neurological symptoms such as new headache, or facial or extremity weakness or numbness .   Pt denies polydipsia, polyuria Does also stil have recurring left and right lateral hip and lower pain (just not at the same time) and has instances of tendency to give away left and right side, but no actual falls.  Pain persists, asks for sports med eval.  Jan 2014 films with left hip DJD and SI joint DJD. Past Medical History  Diagnosis Date  . Hypertension   . Osteoporosis   . Anxiety   . Hyperlipidemia   . COPD (chronic obstructive pulmonary disease)   . Allergic rhinitis   . Gallstones   . DJD (degenerative joint disease)     LS spine  . Disc disease, degenerative, cervical   . History of colonic polyps   . Diverticulosis of colon    Past Surgical History  Procedure Laterality Date  . Abdominal hysterectomy    . Oophorectomy    . Colonoscopy w/ polypectomy      reports that she has quit smoking. She does not have any smokeless tobacco history on file. She reports that she does not drink alcohol or use illicit drugs. family history includes Cancer in an other family member; Coronary artery disease in an other family member; Diabetes in an other family member; Hypertension in an other family member; Parkinsonism in her father; Stroke in an other family member. No Known Allergies Current Outpatient Prescriptions on File Prior to Visit  Medication Sig Dispense Refill  .  aspirin 325 MG EC tablet Take 325 mg by mouth daily.        . diclofenac sodium (VOLTAREN) 1 % GEL Apply 1 application topically 4 (four) times daily.  1 Tube  11  . labetalol (NORMODYNE) 200 MG tablet TAKE 1 TABLET (200 MG TOTAL) BY MOUTH 2 (TWO) TIMES DAILY.  180 tablet  3  . spironolactone (ALDACTONE) 50 MG tablet Take 1 tablet (50 mg total) by mouth 2 (two) times daily.  180 tablet  3  . spironolactone (ALDACTONE) 50 MG tablet TAKE 1 TABLET (50 MG TOTAL) BY MOUTH 2 (TWO) TIMES DAILY.  60 tablet  11  . fexofenadine (ALLEGRA) 180 MG tablet Take 1 tablet (180 mg total) by mouth daily.  90 tablet  3   No current facility-administered medications on file prior to visit.     Review of Systems  Constitutional: Negative for unexpected weight change, or unusual diaphoresis  HENT: Negative for tinnitus.   Eyes: Negative for photophobia and visual disturbance.  Respiratory: Negative for choking and stridor.   Gastrointestinal: Negative for vomiting and blood in stool.  Genitourinary: Negative for hematuria and decreased urine volume.  Musculoskeletal: Negative for acute joint swelling Skin: Negative for color change and wound.  Neurological: Negative for tremors and numbness other than noted  Psychiatric/Behavioral: Negative for decreased concentration or  hyperactivity.       Objective:   Physical Exam BP  130/86  Pulse 73  Temp(Src) 97.7 F (36.5 C) (Oral)  Ht 5\' 4"  (1.626 m)  Wt 200 lb (90.719 kg)  BMI 34.31 kg/m2  SpO2 95% VS noted,  Constitutional: Pt appears well-developed and well-nourished.  HENT: Head: NCAT.  Right Ear: External ear normal.  Left Ear: External ear normal.  Eyes: Conjunctivae and EOM are normal. Pupils are equal, round, and reactive to light.  Neck: Normal range of motion. Neck supple.  Cardiovascular: Normal rate and regular rhythm.   Pulmonary/Chest: Effort normal and breath sounds normal.  Abd:  Soft, NT, non-distended, + BS Neurological: Pt is alert.  Not confused , motor 5/5, sens/dtr, gait intact to exam Skin: Skin is warm. No erythema.  Psychiatric: Pt behavior is normal. Thought content normal.      Assessment & Plan:

## 2012-12-21 NOTE — Patient Instructions (Addendum)
Your next mammogram is due Dec 3 or after Please take all new medication as prescribed - the prednisone for 5 day (low dose) If the tingling to the left arm persists another 2 wk, we should consider MRI for the neck, and possibly even the nerves test for the left arm You had the new Prevnar Pneumonia shot today Please continue all other medications as before, and refills have been done if requested  - the valium You will be contacted regarding the referral for: Dr Katrinka Blazing for leg pains and tendency to give out Your EKG was OK today No need for other lab work today  Please remember to sign up for My Chart if you have not done so, as this will be important to you in the future with finding out test results, communicating by private email, and scheduling acute appointments online when needed.  OK to cancel the Dec 2014 appt with me  Please return in 3 months (January 2014), or sooner if needed, with Lab testing done 3-5 days before

## 2012-12-21 NOTE — Assessment & Plan Note (Signed)
ECG reviewed as per emr, actually more paresthesia than pain, for low dose predpack, tylenol prn, consider C-spine MRI for persistence or worsening in 2-3 wks

## 2012-12-21 NOTE — Addendum Note (Signed)
Addended by: Scharlene Gloss B on: 12/21/2012 10:29 AM   Modules accepted: Orders

## 2012-12-21 NOTE — Assessment & Plan Note (Signed)
?   Clinical significacnne, for sport med eval,  to f/u any worsening symptoms or concerns

## 2012-12-25 ENCOUNTER — Ambulatory Visit: Payer: Medicare Other | Admitting: Family Medicine

## 2013-01-10 ENCOUNTER — Ambulatory Visit: Payer: Medicare Other | Admitting: Family Medicine

## 2013-02-13 ENCOUNTER — Encounter: Payer: Medicare Other | Admitting: Internal Medicine

## 2013-04-10 ENCOUNTER — Other Ambulatory Visit (INDEPENDENT_AMBULATORY_CARE_PROVIDER_SITE_OTHER): Payer: Medicare HMO

## 2013-04-10 ENCOUNTER — Encounter: Payer: Self-pay | Admitting: Internal Medicine

## 2013-04-10 ENCOUNTER — Ambulatory Visit (INDEPENDENT_AMBULATORY_CARE_PROVIDER_SITE_OTHER): Payer: Medicare HMO | Admitting: Internal Medicine

## 2013-04-10 VITALS — BP 136/78 | HR 70 | Temp 98.1°F | Wt 201.6 lb

## 2013-04-10 DIAGNOSIS — J441 Chronic obstructive pulmonary disease with (acute) exacerbation: Secondary | ICD-10-CM | POA: Insufficient documentation

## 2013-04-10 DIAGNOSIS — Z0000 Encounter for general adult medical examination without abnormal findings: Secondary | ICD-10-CM

## 2013-04-10 DIAGNOSIS — J209 Acute bronchitis, unspecified: Secondary | ICD-10-CM

## 2013-04-10 DIAGNOSIS — E785 Hyperlipidemia, unspecified: Secondary | ICD-10-CM

## 2013-04-10 DIAGNOSIS — I10 Essential (primary) hypertension: Secondary | ICD-10-CM

## 2013-04-10 LAB — BASIC METABOLIC PANEL
BUN: 16 mg/dL (ref 6–23)
CALCIUM: 9.5 mg/dL (ref 8.4–10.5)
CO2: 24 meq/L (ref 19–32)
CREATININE: 1.2 mg/dL (ref 0.4–1.2)
Chloride: 106 mEq/L (ref 96–112)
GFR: 55.89 mL/min — ABNORMAL LOW (ref >60.00)
Glucose, Bld: 103 mg/dL — ABNORMAL HIGH (ref 70–99)
Potassium: 3.9 mEq/L (ref 3.5–5.1)
SODIUM: 138 meq/L (ref 135–145)

## 2013-04-10 LAB — LIPID PANEL
CHOLESTEROL: 203 mg/dL — AB (ref 0–200)
HDL: 49.4 mg/dL (ref >39.00)
TRIGLYCERIDES: 185 mg/dL — AB (ref 0.0–149.0)
Total CHOL/HDL Ratio: 4
VLDL: 37 mg/dL (ref 0.0–40.0)

## 2013-04-10 LAB — HEPATIC FUNCTION PANEL
ALBUMIN: 4.1 g/dL (ref 3.5–5.2)
ALK PHOS: 54 U/L (ref 39–117)
ALT: 24 U/L (ref 0–35)
AST: 27 U/L (ref 0–37)
Bilirubin, Direct: 0 mg/dL (ref 0.0–0.3)
TOTAL PROTEIN: 8.2 g/dL (ref 6.0–8.3)
Total Bilirubin: 0.5 mg/dL (ref 0.3–1.2)

## 2013-04-10 LAB — CBC WITH DIFFERENTIAL/PLATELET
BASOS ABS: 0 10*3/uL (ref 0.0–0.1)
BASOS PCT: 0.5 % (ref 0.0–3.0)
Eosinophils Absolute: 0.2 10*3/uL (ref 0.0–0.7)
Eosinophils Relative: 4.1 % (ref 0.0–5.0)
HEMATOCRIT: 40.6 % (ref 36.0–46.0)
HEMOGLOBIN: 13.4 g/dL (ref 12.0–15.0)
LYMPHS ABS: 2.3 10*3/uL (ref 0.7–4.0)
LYMPHS PCT: 52.9 % — AB (ref 12.0–46.0)
MCHC: 32.9 g/dL (ref 30.0–36.0)
MCV: 89.2 fl (ref 78.0–100.0)
Monocytes Absolute: 0.4 10*3/uL (ref 0.1–1.0)
Monocytes Relative: 9.6 % (ref 3.0–12.0)
NEUTROS ABS: 1.5 10*3/uL (ref 1.4–7.7)
Neutrophils Relative %: 32.9 % — ABNORMAL LOW (ref 43.0–77.0)
Platelets: 138 10*3/uL — ABNORMAL LOW (ref 150.0–400.0)
RBC: 4.55 Mil/uL (ref 3.87–5.11)
RDW: 14.3 % (ref 11.5–14.6)
WBC: 4.4 10*3/uL — ABNORMAL LOW (ref 4.5–10.5)

## 2013-04-10 LAB — URINALYSIS, ROUTINE W REFLEX MICROSCOPIC
BILIRUBIN URINE: NEGATIVE
HGB URINE DIPSTICK: NEGATIVE
NITRITE: NEGATIVE
RBC / HPF: NONE SEEN (ref 0–?)
Specific Gravity, Urine: 1.02 (ref 1.000–1.030)
Total Protein, Urine: NEGATIVE
Urine Glucose: NEGATIVE
Urobilinogen, UA: 0.2 (ref 0.0–1.0)
pH: 6.5 (ref 5.0–8.0)

## 2013-04-10 LAB — TSH: TSH: 0.96 u[IU]/mL (ref 0.35–5.50)

## 2013-04-10 LAB — LDL CHOLESTEROL, DIRECT: LDL DIRECT: 121.4 mg/dL

## 2013-04-10 MED ORDER — HYDROCODONE-HOMATROPINE 5-1.5 MG/5ML PO SYRP: 5.0000 mL | ORAL_SOLUTION | Freq: Four times a day (QID) | ORAL | Status: DC | PRN

## 2013-04-10 MED ORDER — AZITHROMYCIN 250 MG PO TABS: ORAL_TABLET | ORAL | Status: DC

## 2013-04-10 MED ORDER — PREDNISONE 10 MG PO TABS: ORAL_TABLET | ORAL | Status: DC

## 2013-04-10 NOTE — Patient Instructions (Addendum)
Please take all new medication as prescribed - the cough medicine, antibiotic, and prednisone  Please continue all other medications as before, and refills have been done if requested.  Please have the pharmacy call with any other refills you may need.  Please continue your efforts at being more active, low cholesterol diet, and weight control. You are otherwise up to date with prevention measures today.  Please keep your appointments with your specialists as you may have planned  Please call for your yearly mammogram  Please return if you change your mind about the new Prevnar pneumonia shot  Please go to the LAB in the Basement (turn left off the elevator) for the tests to be done today You will be contacted by phone if any changes need to be made immediately.  Otherwise, you will receive a letter about your results with an explanation, but please check with MyChart first.  Please remember to sign up for MyChart if you have not done so, as this will be important to you in the future with finding out test results, communicating by private email, and scheduling acute appointments online when needed.  Please return in 6 months, or sooner if needed

## 2013-04-10 NOTE — Assessment & Plan Note (Signed)
Mild to mod, for predpack asd,  to f/u any worsening symptoms or concerns 

## 2013-04-10 NOTE — Progress Notes (Signed)
Pre-visit discussion using our clinic review tool. No additional management support is needed unless otherwise documented below in the visit note.  

## 2013-04-10 NOTE — Assessment & Plan Note (Signed)
stable overall by history and exam, recent data reviewed with pt, and pt to continue medical treatment as before,  to f/u any worsening symptoms or concerns BP Readings from Last 3 Encounters:  04/10/13 136/78  12/21/12 130/86  03/31/12 128/90

## 2013-04-10 NOTE — Assessment & Plan Note (Signed)

## 2013-04-10 NOTE — Assessment & Plan Note (Signed)
?   Viral, for zpack asd

## 2013-04-10 NOTE — Assessment & Plan Note (Signed)
stable overall by history and exam, recent data reviewed with pt, and pt to continue medical treatment as before,  to f/u any worsening symptoms or concerns LDL 116 feb 2014  For f/u labs, cont diet

## 2013-04-10 NOTE — Progress Notes (Signed)
Subjective:    Patient ID: Megan Baldwin, female    DOB: 04/13/1933, 80 y.o.   MRN: [id number]  HPI  Here for wellness and f/u;  Overall doing ok;  Pt denies CP, worsening SOB, DOE, wheezing, orthopnea, PND, worsening LE edema, palpitations, dizziness or syncope, except has cough and wheeze, sob for the past wk, somewhat feverish, with wheezing worse last few days,  asks for cough med today as is just out and was helping.. Today is first day out of the house in1 wk.  Feels somewhat better today overall.  Pt denies neurological change such as new headache, facial or extremity weakness.  Pt denies polydipsia, polyuria, or low sugar symptoms. Pt states overall good compliance with treatment and medications, good tolerability, and has been trying to follow lower cholesterol diet.  Pt denies worsening depressive symptoms, suicidal ideation or panic. No fever, night sweats, wt loss, loss of appetite, or other constitutional symptoms.  Pt states good ability with ADL's, has low fall risk, home safety reviewed and adequate, no other significant changes in hearing or vision, and only occasionally active with exercise.  Declines me to refer for mammogram, plans to call on her own. No other acute complaints  Past Medical History  Diagnosis Date  . Hypertension   . Osteoporosis   . Anxiety   . Hyperlipidemia   . COPD (chronic obstructive pulmonary disease)   . Allergic rhinitis   . Gallstones   . DJD (degenerative joint disease)     LS spine  . Disc disease, degenerative, cervical   . History of colonic polyps   . Diverticulosis of colon   . Aortic valve regurgitation 10/12/2010   Past Surgical History  Procedure Laterality Date  . Abdominal hysterectomy    . Oophorectomy    . Colonoscopy w/ polypectomy      reports that she has quit smoking. She does not have any smokeless tobacco history on file. She reports that she does not drink alcohol or use illicit drugs. family history includes Cancer in  an other family member; Coronary artery disease in an other family member; Diabetes in an other family member; Hypertension in an other family member; Parkinsonism in her father; Stroke in an other family member. No Known Allergies Current Outpatient Prescriptions on File Prior to Visit  Medication Sig Dispense Refill  . aspirin 325 MG EC tablet Take 325 mg by mouth daily.        . diazepam (VALIUM) 5 MG tablet TAKE 1 TABLET BY MOUTH TWICE A DAY AS NEEDED  60 tablet  2  . diclofenac sodium (VOLTAREN) 1 % GEL Apply 1 application topically 4 (four) times daily.  1 Tube  11  . labetalol (NORMODYNE) 200 MG tablet TAKE 1 TABLET (200 MG TOTAL) BY MOUTH 2 (TWO) TIMES DAILY.  180 tablet  3  . spironolactone (ALDACTONE) 50 MG tablet Take 1 tablet (50 mg total) by mouth 2 (two) times daily.  180 tablet  3  . spironolactone (ALDACTONE) 50 MG tablet TAKE 1 TABLET (50 MG TOTAL) BY MOUTH 2 (TWO) TIMES DAILY.  60 tablet  11  . fexofenadine (ALLEGRA) 180 MG tablet Take 1 tablet (180 mg total) by mouth daily.  90 tablet  3   No current facility-administered medications on file prior to visit.   Review of Systems Constitutional: Negative for diaphoresis, activity change, appetite change or unexpected weight change.  HENT: Negative for hearing loss, ear pain, facial swelling, mouth sores and neck stiffness.  Eyes: Negative for pain, redness and visual disturbance.  Respiratory: Negative for shortness of breath and wheezing.   Cardiovascular: Negative for chest pain and palpitations.  Gastrointestinal: Negative for diarrhea, blood in stool, abdominal distention or other pain Genitourinary: Negative for hematuria, flank pain or change in urine volume.  Musculoskeletal: Negative for myalgias and joint swelling.  Skin: Negative for color change and wound.  Neurological: Negative for syncope and numbness. other than noted Hematological: Negative for adenopathy.  Psychiatric/Behavioral: Negative for  hallucinations, self-injury, decreased concentration and agitation.      Objective:   Physical Exam BP 136/78  Pulse 70  Temp(Src) 98.1 F (36.7 C) (Oral)  Wt 201 lb 9 oz (91.428 kg)  SpO2 95% VS noted, mild ill Constitutional: Pt is oriented to person, place, and time. Appears well-developed and well-nourished.  Head: Normocephalic and atraumatic.  Right Ear: External ear normal.  Left Ear: External ear normal.  Nose: Nose normal.  Mouth/Throat: Oropharynx is clear and moist.  Bilat tm's with mild erythema.  Max sinus areas non tender.  Pharynx with mild erythema, no exudate Eyes: Conjunctivae and EOM are normal. Pupils are equal, round, and reactive to light.  Neck: Normal range of motion. Neck supple. No JVD present. No tracheal deviation present.  Cardiovascular: Normal rate, regular rhythm, normal heart sounds and intact distal pulses.   Pulmonary/Chest: Effort normal and breath sounds decreased, few wheeze bilat, few RLL rales.  Abdominal: Soft. Bowel sounds are normal. There is no tenderness. No HSM  Musculoskeletal: Normal range of motion. Exhibits no edema.  Lymphadenopathy:  Has no cervical adenopathy.  Neurological: Pt is alert and oriented to person, place, and time. Pt has normal reflexes. No cranial nerve deficit.  Skin: Skin is warm and dry. No rash noted.  Psychiatric:  Has anxious mood and affect. Behavior is normal.     Assessment & Plan:

## 2013-04-14 ENCOUNTER — Other Ambulatory Visit: Payer: Self-pay | Admitting: Internal Medicine

## 2013-05-07 ENCOUNTER — Encounter: Payer: Self-pay | Admitting: Internal Medicine

## 2013-09-04 ENCOUNTER — Other Ambulatory Visit: Payer: Self-pay | Admitting: Internal Medicine

## 2013-12-10 ENCOUNTER — Other Ambulatory Visit: Payer: Self-pay | Admitting: Internal Medicine

## 2013-12-10 DIAGNOSIS — Z1231 Encounter for screening mammogram for malignant neoplasm of breast: Secondary | ICD-10-CM

## 2013-12-18 ENCOUNTER — Other Ambulatory Visit: Payer: Self-pay | Admitting: Internal Medicine

## 2013-12-18 ENCOUNTER — Ambulatory Visit (HOSPITAL_COMMUNITY)
Admission: RE | Admit: 2013-12-18 | Discharge: 2013-12-18 | Disposition: A | Discharge status: Home / Self Care | Payer: Medicare HMO | Source: Ambulatory Visit | Attending: Internal Medicine | Admitting: Internal Medicine

## 2013-12-18 DIAGNOSIS — Z1231 Encounter for screening mammogram for malignant neoplasm of breast: Secondary | ICD-10-CM

## 2013-12-19 ENCOUNTER — Encounter: Payer: Self-pay | Admitting: Internal Medicine

## 2013-12-19 ENCOUNTER — Ambulatory Visit (INDEPENDENT_AMBULATORY_CARE_PROVIDER_SITE_OTHER): Payer: Medicare HMO | Admitting: Internal Medicine

## 2013-12-19 VITALS — BP 140/88 | HR 71 | Temp 97.6°F | Ht 63.5 in | Wt 201.0 lb

## 2013-12-19 DIAGNOSIS — H9193 Unspecified hearing loss, bilateral: Secondary | ICD-10-CM | POA: Insufficient documentation

## 2013-12-19 DIAGNOSIS — Z0189 Encounter for other specified special examinations: Secondary | ICD-10-CM

## 2013-12-19 DIAGNOSIS — F411 Generalized anxiety disorder: Secondary | ICD-10-CM

## 2013-12-19 DIAGNOSIS — Z23 Encounter for immunization: Secondary | ICD-10-CM

## 2013-12-19 DIAGNOSIS — E785 Hyperlipidemia, unspecified: Secondary | ICD-10-CM

## 2013-12-19 DIAGNOSIS — I10 Essential (primary) hypertension: Secondary | ICD-10-CM

## 2013-12-19 DIAGNOSIS — M79605 Pain in left leg: Secondary | ICD-10-CM

## 2013-12-19 DIAGNOSIS — Z0000 Encounter for general adult medical examination without abnormal findings: Secondary | ICD-10-CM

## 2013-12-19 DIAGNOSIS — J3089 Other allergic rhinitis: Secondary | ICD-10-CM

## 2013-12-19 MED ORDER — DIAZEPAM 5 MG PO TABS: ORAL_TABLET | ORAL | Status: DC

## 2013-12-19 MED ORDER — SPIRONOLACTONE 50 MG PO TABS: 50.0000 mg | ORAL_TABLET | Freq: Two times a day (BID) | ORAL | Status: DC

## 2013-12-19 MED ORDER — FEXOFENADINE HCL 180 MG PO TABS: 180.0000 mg | ORAL_TABLET | Freq: Every day | ORAL | Status: DC

## 2013-12-19 MED ORDER — LABETALOL HCL 200 MG PO TABS: 200.0000 mg | ORAL_TABLET | Freq: Two times a day (BID) | ORAL | Status: DC

## 2013-12-19 NOTE — Assessment & Plan Note (Addendum)
Mild to mod, for allegra prn,  to f/u any worsening symptoms or concerns  Note:  Total time for pt hx, exam, review of record with pt in the room, determination of diagnoses and plan for further eval and tx is > 40 min, with over 50% spent in coordination and counseling of patient

## 2013-12-19 NOTE — Assessment & Plan Note (Signed)
stable overall by history and exam, recent data reviewed with pt, and pt to continue medical treatment as before,  to f/u any worsening symptoms or concerns BP Readings from Last 3 Encounters:  12/19/13 140/88  04/10/13 136/78  12/21/12 130/86

## 2013-12-19 NOTE — Progress Notes (Signed)
Pre visit review using our clinic review tool, if applicable. No additional management support is needed unless otherwise documented below in the visit note. 

## 2013-12-19 NOTE — Assessment & Plan Note (Signed)
Valium refilled, o/w stable

## 2013-12-19 NOTE — Progress Notes (Signed)
Subjective:    Patient ID: Megan Baldwin, female    DOB: 08/12/1933, 80 y.o.   MRN: [id number]  HPI    Here to f/u  -  Does have several wks ongoing nasal allergy symptoms with clearish congestion, itch and sneezing, without fever, pain, ST, cough, swelling or wheezing.  Also with recurrent left leg pain, not clear to her but seems to want to giveaway and almost fall about once per month, with pain at hip, knee and leg, hard to localize, denies LBP., not clearly radicular to me.  Jan 2014 left hip film with mild DJD.  Was asked to see Dr Smith/sport med previously but now willing to do so.    Has also had bilat ear wax impactions with worsening hearing recently, no pain, home tx not working  Past Medical History  Diagnosis Date  . Hypertension   . Osteoporosis   . Anxiety   . Hyperlipidemia   . COPD (chronic obstructive pulmonary disease)   . Allergic rhinitis   . Gallstones   . DJD (degenerative joint disease)     LS spine  . Disc disease, degenerative, cervical   . History of colonic polyps   . Diverticulosis of colon   . Aortic valve regurgitation 10/12/2010   Past Surgical History  Procedure Laterality Date  . Abdominal hysterectomy    . Oophorectomy    . Colonoscopy w/ polypectomy      reports that she has quit smoking. She does not have any smokeless tobacco history on file. She reports that she does not drink alcohol or use illicit drugs. family history includes Cancer in an other family member; Coronary artery disease in an other family member; Diabetes in an other family member; Hypertension in an other family member; Parkinsonism in her father; Stroke in an other family member. No Known Allergies Current Outpatient Prescriptions on File Prior to Visit  Medication Sig Dispense Refill  . aspirin 325 MG EC tablet Take 325 mg by mouth daily.        . diazepam (VALIUM) 5 MG tablet TAKE 1 TABLET BY MOUTH TWICE A DAY AS NEEDED  60 tablet  2  . diclofenac sodium (VOLTAREN)  1 % GEL Apply 1 application topically 4 (four) times daily.  1 Tube  11  . fexofenadine (ALLEGRA) 180 MG tablet Take 1 tablet (180 mg total) by mouth daily.  90 tablet  3   No current facility-administered medications on file prior to visit.    Review of Systems  Constitutional: Negative for unusual diaphoresis or other sweats  HENT: Negative for ringing in ear Eyes: Negative for double vision or worsening visual disturbance.  Respiratory: Negative for choking and stridor.   Gastrointestinal: Negative for vomiting or other signifcant bowel change Genitourinary: Negative for hematuria or decreased urine volume.  Musculoskeletal: Negative for other MSK pain or swelling Skin: Negative for color change and worsening wound.  Neurological: Negative for tremors and numbness other than noted  Psychiatric/Behavioral: Negative for decreased concentration or agitation other than above       Objective:   Physical Exam BP 140/88  Pulse 71  Temp(Src) 97.6 F (36.4 C) (Oral)  Ht 5' 3.5" (1.613 m)  Wt 201 lb (91.173 kg)  BMI 35.04 kg/m2  SpO2 94% VS noted,  Constitutional: Pt appears well-developed, well-nourished.  HENT: Head: NCAT.  Right Ear: External ear normal.  Left Ear: External ear normal.  bilat impactions removed with irrigation, hearing subjectively improved Eyes: . Pupils are equal,  round, and reactive to light. Conjunctivae and EOM are normal Neck: Normal range of motion. Neck supple.  Bilat tm's with mild erythema.  Max sinus areas non tender.  Pharynx with mild erythema, no exudate Cardiovascular: Normal rate and regular rhythm.   Pulmonary/Chest: Effort normal and breath sounds normal.  Neurological: Pt is alert. Not confused , motor grossly intact Skin: Skin is warm. No rash, dorsalis pedis 1+ bilat Psychiatric: Pt behavior is normal. No agitation.     Assessment & Plan:

## 2013-12-19 NOTE — Assessment & Plan Note (Signed)
Etiology unclear - for referral Dr Smith/sport med

## 2013-12-19 NOTE — Assessment & Plan Note (Signed)
Mild elev feb 2015, for lower chol diet, d/w pt, f/u labs next visit,  to f/u any worsening symptoms or concerns

## 2013-12-19 NOTE — Patient Instructions (Addendum)
You had the flu shot today  Your ears were irrigated of wax today  Please take all new medication as prescribed - the allegra for allergies  Please continue all other medications as before, and refills have been done if requested - the blood pressure medications  Please have the pharmacy call with any other refills you may need.  Please continue your efforts at being more active, low cholesterol diet, and weight control.  Please keep your appointments with your specialists as you may have planned  You will be contacted regarding the referral for: Dr Katrinka BlazingSmith for the left leg  Please return in 3 months, or sooner if needed, with Lab testing done 3-5 days before

## 2013-12-19 NOTE — Assessment & Plan Note (Signed)
Improved with irrigation,  to f/u any worsening symptoms or concerns  

## 2013-12-20 ENCOUNTER — Telehealth: Payer: Self-pay | Admitting: Internal Medicine

## 2013-12-20 NOTE — Telephone Encounter (Signed)
emmi mailed  °

## 2014-01-04 ENCOUNTER — Ambulatory Visit (INDEPENDENT_AMBULATORY_CARE_PROVIDER_SITE_OTHER)
Admission: RE | Admit: 2014-01-04 | Discharge: 2014-01-04 | Disposition: A | Discharge status: Home / Self Care | Payer: Commercial Managed Care - HMO | Source: Ambulatory Visit | Attending: Family Medicine | Admitting: Family Medicine

## 2014-01-04 ENCOUNTER — Ambulatory Visit (INDEPENDENT_AMBULATORY_CARE_PROVIDER_SITE_OTHER): Payer: Commercial Managed Care - HMO | Admitting: Family Medicine

## 2014-01-04 ENCOUNTER — Encounter: Payer: Self-pay | Admitting: Family Medicine

## 2014-01-04 VITALS — BP 136/84 | HR 84 | Ht 63.5 in | Wt 201.0 lb

## 2014-01-04 DIAGNOSIS — M79605 Pain in left leg: Secondary | ICD-10-CM

## 2014-01-04 NOTE — Assessment & Plan Note (Signed)
Patient does have scarring over the anterior tibia region on the left lower extremity that is likely giving her most of her discomfort. It does not appear the patient actually had any osteomyelitis when she had the initial injury. Based on the muscle wasting no of the lateral gastrocnemius head do think that this infection originally was worse than patient is stating. Patient though did have this happened greater than 70 years ago. I believe the patient may be having some mild discomfort this likely secondary to osteoporosis and down the patient does not have the lateral gastrocnemius to help with balance she is starting to have radicular symptoms causing more strain on the iliotibial band. Patient was given exercises, discussed proper shoe wear, discussed over-the-counter medications as well as an icing protocol and compression. Discussed the importance of her vitamin D and we did increase it as well. Patient will come back and see me again in 3-4 weeks for further evaluation. X-rays of patient's knee and tibia are pending for any further bony abnormalities that could be causing this pathology.

## 2014-01-04 NOTE — Progress Notes (Signed)
  Megan Baldwin D.O. [facility] [address] [place], [place] [address] Phone: [phone] Subjective:    I'm seeing this patient by the request  of:  [name], MD   CC: left leg pain  OZH:[id number]:Subjective Megan Baldwin is a 80 y.o. female coming in with complaint of left lower leg pain. Patient does have a past medical history significant for a staph infection in the lower extremity when she was 80 years old. Patient did need to have debridement done. Patient does have a scar in that area her all life. Over the course of the last 2 years though it has started to increase in pain. Patient states that the pain seems to be more when she is doing ambulation or walking. States that he can radiate up past her knee and towards her left hip. Denies any back pain that is associated with it. Patient has had a workup for this previously at trace of her hip have been done. X-rays show that patient has mild osteoarthritic changes of the hip. Patient states that this does not stop her from any activity but it is uncomfortable. Patient has done a trial of topical anti-inflammatories with minimal benefit. No other home modalities have been done. Patient puts the severity of pain is 6 out of 10. No nighttime awakenings. No fevers chills or any abnormal weight loss.     Past medical history, social, surgical and family history all reviewed in electronic medical record.   Review of Systems: No headache, visual changes, nausea, vomiting, diarrhea, constipation, dizziness, abdominal pain, skin rash, fevers, chills, night sweats, weight loss, swollen lymph nodes, body aches, joint swelling, muscle aches, chest pain, shortness of breath, mood changes.   Objective Blood pressure 136/84, pulse 84, height 5' 3.5" (1.613 m), weight 201 lb (91.173 kg), SpO2 98 %.  General: No apparent distress alert and oriented x3 mood and affect normal, dressed appropriately.  HEENT: Pupils equal, extraocular movements intact    Respiratory: Patient's speak in full sentences and does not appear short of breath  Cardiovascular: trace lower extremity edemato ankles bilaterally, non tender, no erythema  Skin: Warm dry intact with no signs of infection or rash on extremities or on axial skeleton.  Abdomen: Soft nontender  Neuro: Cranial nerves II through XII are intact, neurovascularly intact in all extremities with 2+ DTRs and 2+ pulses.  Lymph: No lymphadenopathy of posterior or anterior cervical chain or axillae bilaterally.  Gait normal with good balance and coordination.  MSK:  Non tender with full range of motion and good stability and symmetric strength and tone of shoulders, elbows, wrist, hip, knee and ankles bilaterally.  Left tibia shows the patient does have a scar on the anterior lateral aspect of the tibia approximately 6 cm from malleoli. This measured approximately 2-1/2 cm in diameter. This does show some muscle wasting in the area. Patient also has some wasting of the lateral gastrocnemius head. Patient though does have full range of motion and is neurovascularly intact of the ankle. Patient is minimally tender in the area with no signs of infection.    Impression and Recommendations:     This case required medical decision making of moderate complexity.

## 2014-01-04 NOTE — Patient Instructions (Signed)
Good to see you Compression sleeve to the calf.  [facility] Wear tennis shoes when you can.  Arnica lotion can help with the scarring.  Exercises 3 times a week.  Turmeric 500mg  daily Vitamin D 5000 IU daily See me again in 3-4 weeks.

## 2014-03-21 ENCOUNTER — Ambulatory Visit: Payer: Medicare HMO | Admitting: Internal Medicine

## 2014-04-02 ENCOUNTER — Other Ambulatory Visit: Payer: Self-pay | Admitting: Internal Medicine

## 2014-04-30 ENCOUNTER — Telehealth: Payer: Self-pay | Admitting: Internal Medicine

## 2014-04-30 ENCOUNTER — Other Ambulatory Visit: Payer: Self-pay | Admitting: Internal Medicine

## 2014-04-30 DIAGNOSIS — Z0000 Encounter for general adult medical examination without abnormal findings: Secondary | ICD-10-CM

## 2014-04-30 NOTE — Telephone Encounter (Signed)
Pt request lab work prior to her appt on 05/03/14. Please call pt

## 2014-04-30 NOTE — Telephone Encounter (Signed)
Labs ordered.

## 2014-05-01 NOTE — Telephone Encounter (Signed)
Notified pt md has place lab orders.../lmb 

## 2014-05-02 ENCOUNTER — Other Ambulatory Visit (INDEPENDENT_AMBULATORY_CARE_PROVIDER_SITE_OTHER): Payer: Commercial Managed Care - HMO

## 2014-05-02 DIAGNOSIS — Z0189 Encounter for other specified special examinations: Secondary | ICD-10-CM | POA: Diagnosis not present

## 2014-05-02 DIAGNOSIS — Z0000 Encounter for general adult medical examination without abnormal findings: Secondary | ICD-10-CM

## 2014-05-02 LAB — HEPATIC FUNCTION PANEL
ALBUMIN: 4.6 g/dL (ref 3.5–5.2)
ALT: 26 U/L (ref 0–35)
AST: 25 U/L (ref 0–37)
Alkaline Phosphatase: 73 U/L (ref 39–117)
Bilirubin, Direct: 0.1 mg/dL (ref 0.0–0.3)
Total Bilirubin: 0.5 mg/dL (ref 0.2–1.2)
Total Protein: 8.5 g/dL — ABNORMAL HIGH (ref 6.0–8.3)

## 2014-05-02 LAB — BASIC METABOLIC PANEL
BUN: 18 mg/dL (ref 6–23)
CHLORIDE: 103 meq/L (ref 96–112)
CO2: 30 meq/L (ref 19–32)
Calcium: 10.2 mg/dL (ref 8.4–10.5)
Creatinine, Ser: 1.21 mg/dL — ABNORMAL HIGH (ref 0.40–1.20)
GFR: 55.2 mL/min — AB (ref >60.00)
Glucose, Bld: 97 mg/dL (ref 70–99)
Potassium: 4.6 mEq/L (ref 3.5–5.1)
SODIUM: 138 meq/L (ref 135–145)

## 2014-05-02 LAB — URINALYSIS, ROUTINE W REFLEX MICROSCOPIC
Bilirubin Urine: NEGATIVE
Hgb urine dipstick: NEGATIVE
Ketones, ur: NEGATIVE
NITRITE: NEGATIVE
PH: 7 (ref 5.0–8.0)
RBC / HPF: NONE SEEN (ref 0–?)
Specific Gravity, Urine: 1.015 (ref 1.000–1.030)
Total Protein, Urine: NEGATIVE
UROBILINOGEN UA: 0.2 (ref 0.0–1.0)
Urine Glucose: NEGATIVE

## 2014-05-02 LAB — TSH: TSH: 1.16 u[IU]/mL (ref 0.35–4.50)

## 2014-05-02 LAB — LIPID PANEL
CHOLESTEROL: 202 mg/dL — AB (ref 0–200)
HDL: 62.7 mg/dL (ref >39.00)
LDL Cholesterol: 117 mg/dL — ABNORMAL HIGH (ref 0–99)
NonHDL: 139.3
TRIGLYCERIDES: 112 mg/dL (ref 0.0–149.0)
Total CHOL/HDL Ratio: 3
VLDL: 22.4 mg/dL (ref 0.0–40.0)

## 2014-05-02 LAB — CBC WITH DIFFERENTIAL/PLATELET
Basophils Absolute: 0 10*3/uL (ref 0.0–0.1)
Basophils Relative: 0.4 % (ref 0.0–3.0)
EOS ABS: 0.3 10*3/uL (ref 0.0–0.7)
EOS PCT: 5 % (ref 0.0–5.0)
HCT: 41.8 % (ref 36.0–46.0)
Hemoglobin: 14.1 g/dL (ref 12.0–15.0)
Lymphocytes Relative: 41.7 % (ref 12.0–46.0)
Lymphs Abs: 2.2 10*3/uL (ref 0.7–4.0)
MCHC: 33.8 g/dL (ref 30.0–36.0)
MCV: 88.1 fl (ref 78.0–100.0)
MONO ABS: 0.6 10*3/uL (ref 0.1–1.0)
Monocytes Relative: 11.5 % (ref 3.0–12.0)
NEUTROS PCT: 41.4 % — AB (ref 43.0–77.0)
Neutro Abs: 2.2 10*3/uL (ref 1.4–7.7)
PLATELETS: 175 10*3/uL (ref 150.0–400.0)
RBC: 4.75 Mil/uL (ref 3.87–5.11)
RDW: 14.4 % (ref 11.5–15.5)
WBC: 5.2 10*3/uL (ref 4.0–10.5)

## 2014-05-03 ENCOUNTER — Ambulatory Visit (INDEPENDENT_AMBULATORY_CARE_PROVIDER_SITE_OTHER): Payer: Commercial Managed Care - HMO | Admitting: Internal Medicine

## 2014-05-03 ENCOUNTER — Encounter: Payer: Self-pay | Admitting: Internal Medicine

## 2014-05-03 VITALS — BP 158/106 | HR 64 | Temp 97.9°F | Ht 64.0 in | Wt 199.1 lb

## 2014-05-03 DIAGNOSIS — R42 Dizziness and giddiness: Secondary | ICD-10-CM | POA: Insufficient documentation

## 2014-05-03 DIAGNOSIS — M810 Age-related osteoporosis without current pathological fracture: Secondary | ICD-10-CM | POA: Diagnosis not present

## 2014-05-03 DIAGNOSIS — Z0000 Encounter for general adult medical examination without abnormal findings: Secondary | ICD-10-CM | POA: Diagnosis not present

## 2014-05-03 MED ORDER — DICLOFENAC SODIUM 1 % TD GEL: 1.0000 "application " | Freq: Four times a day (QID) | TRANSDERMAL | Status: DC

## 2014-05-03 NOTE — Assessment & Plan Note (Signed)

## 2014-05-03 NOTE — Assessment & Plan Note (Signed)
For dxa f/u, cont ca/vit d supplement

## 2014-05-03 NOTE — Progress Notes (Signed)
Pre visit review using our clinic review tool, if applicable. No additional management support is needed unless otherwise documented below in the visit note. 

## 2014-05-03 NOTE — Assessment & Plan Note (Signed)
Transient episode last wk x 1, for carotid doppler, cont current tx,  to f/u any worsening symptoms or concerns

## 2014-05-03 NOTE — Progress Notes (Signed)
Subjective:    Patient ID: Megan Baldwin, female    DOB: [birth year], 81 y.o.   MRN: [id number]  HPI  Here for wellness and f/u;  Overall doing ok;  Pt denies CP, worsening SOB, DOE, wheezing, orthopnea, PND, worsening LE edema, palpitations, dizziness or syncope.  Pt denies neurological change such as new headache, facial or extremity weakness.  Pt denies polydipsia, polyuria, or low sugar symptoms. Pt states overall good compliance with treatment and medications, good tolerability, and has been trying to follow lower cholesterol diet.  Pt denies worsening depressive symptoms, suicidal ideation or panic. No fever, night sweats, wt loss, loss of appetite, or other constitutional symptoms.  Pt states good ability with ADL's, has low fall risk, home safety reviewed and adequate, no other significant changes in hearing or vision, and only occasionally active with exercise. Missed BP pills last night and this am.  Asks about carotid doppler as a friend had a stroke, and Megan Baldwin on TV recommends this. Stopped the voltaren for the right arm due to becoming concerned about possible side effects, so quit this.  [employer] wants her to have a DXA.  Pt c/o acute onset LBP a few days ago with bending sort of with sitting on commode, better with standing up, no bowel or bladder change, fever, wt loss,  worsening LE pain/numbness/weakness, gait change or falls., and concerned about osteporosis b/c of the back pain.  Taking several supplements to go over today. Wondering if ok Past Medical History  Diagnosis Date  . Hypertension   . Osteoporosis   . Anxiety   . Hyperlipidemia   . COPD (chronic obstructive pulmonary disease)   . Allergic rhinitis   . Gallstones   . DJD (degenerative joint disease)     LS spine  . Disc disease, degenerative, cervical   . History of colonic polyps   . Diverticulosis of colon   . Aortic valve regurgitation 10/12/2010   Past Surgical History  Procedure Laterality Date  . Abdominal  hysterectomy    . Oophorectomy    . Colonoscopy w/ polypectomy      reports that she has quit smoking. She does not have any smokeless tobacco history on file. She reports that she does not drink alcohol or use illicit drugs. family history includes Cancer in an other family member; Coronary artery disease in an other family member; Diabetes in an other family member; Hypertension in an other family member; Parkinsonism in her father; Stroke in an other family member. No Known Allergies Current Outpatient Prescriptions on File Prior to Visit  Medication Sig Dispense Refill  . aspirin 325 MG EC tablet Take 325 mg by mouth daily.      . diazepam (VALIUM) 5 MG tablet TAKE 1 TABLET BY MOUTH TWICE A DAY AS NEEDED 60 tablet 2  . diclofenac sodium (VOLTAREN) 1 % GEL Apply 1 application topically 4 (four) times daily. 1 Tube 11  . fexofenadine (ALLEGRA) 180 MG tablet Take 1 tablet (180 mg total) by mouth daily. 90 tablet 3  . labetalol (NORMODYNE) 200 MG tablet Take 1 tablet (200 mg total) by mouth 2 (two) times daily. 180 tablet 3  . labetalol (NORMODYNE) 200 MG tablet TAKE 1 TABLET (200 MG TOTAL) BY MOUTH 2 (TWO) TIMES DAILY. 180 tablet 3  . spironolactone (ALDACTONE) 50 MG tablet Take 1 tablet (50 mg total) by mouth 2 (two) times daily. 180 tablet 3   No current facility-administered medications on file prior to visit.  Review of Systems Constitutional: Negative for increased diaphoresis, other activity, appetite or other siginficant weight change  HENT: Negative for worsening hearing loss, ear pain, facial swelling, mouth sores and neck stiffness.   Eyes: Negative for other worsening pain, redness or visual disturbance.  Respiratory: Negative for shortness of breath and wheezing.   Cardiovascular: Negative for chest pain and palpitations.  Gastrointestinal: Negative for diarrhea, blood in stool, abdominal distention or other pain Genitourinary: Negative for hematuria, flank pain or change  in urine volume.  Musculoskeletal: Negative for myalgias or other joint complaints.  Skin: Negative for color change and wound.  Neurological: Negative for syncope and numbness. other than noted Hematological: Negative for adenopathy. or other swelling Psychiatric/Behavioral: Negative for hallucinations, self-injury, decreased concentration or other worsening agitation.      Objective:   Physical Exam BP 158/106 mmHg  Pulse 64  Temp(Src) 97.9 F (36.6 C) (Oral)  Ht 5\' 4"  (1.626 m)  Wt 199 lb 1.9 oz (90.32 kg)  BMI 34.16 kg/m2  SpO2 97% VS noted,  Constitutional: Pt is oriented to person, place, and time. Appears well-developed and well-nourished. Megan Baldwin/obese Head: Normocephalic and atraumatic.  Right Ear: External ear normal.  Left Ear: External ear normal.  Nose: Nose normal.  Mouth/Throat: Oropharynx is clear and moist.  Eyes: Conjunctivae and EOM are normal. Pupils are equal, round, and reactive to light.  Neck: Normal range of motion. Neck supple. No JVD present. No tracheal deviation present.  Cardiovascular: Normal rate, regular rhythm, normal heart sounds and intact distal pulses.   Pulmonary/Chest: Effort normal and breath sounds without rales or wheezing  Abdominal: Soft. Bowel sounds are normal. NT. No HSM  Musculoskeletal: Normal range of motion. Exhibits no edema.  Lymphadenopathy:  Has no cervical adenopathy.  Neurological: Pt is alert and oriented to person, place, and time. Pt has normal reflexes. No cranial nerve deficit. Motor grossly intact Skin: Skin is warm and dry. No rash noted.  Psychiatric:  Has normal mood and affect. Behavior is normal. + mil depressed affect mild - declines tx    Assessment & Plan:

## 2014-05-03 NOTE — Patient Instructions (Addendum)
Please continue all other medications as before, and refills have been done if requested.  Please have the pharmacy call with any other refills you may need.  Please continue your efforts at being more active, low cholesterol diet, and weight control.  You are otherwise up to date with prevention measures today.  Please keep your appointments with your specialists as you may have planned  Please schedule the bone density test before leaving today at the scheduling desk (where you check out)  Please remember to sign up for MyChart if you have not done so, as this will be important to you in the future with finding out test results, communicating by private email, and scheduling acute appointments online when needed.  Please return in 6 months, or sooner if needed

## 2014-05-09 ENCOUNTER — Ambulatory Visit (INDEPENDENT_AMBULATORY_CARE_PROVIDER_SITE_OTHER)
Admission: RE | Admit: 2014-05-09 | Discharge: 2014-05-09 | Disposition: A | Discharge status: Home / Self Care | Payer: Commercial Managed Care - HMO | Source: Ambulatory Visit | Attending: Internal Medicine | Admitting: Internal Medicine

## 2014-05-09 DIAGNOSIS — M810 Age-related osteoporosis without current pathological fracture: Secondary | ICD-10-CM | POA: Diagnosis not present

## 2014-05-09 DIAGNOSIS — Z0000 Encounter for general adult medical examination without abnormal findings: Secondary | ICD-10-CM

## 2014-05-10 ENCOUNTER — Other Ambulatory Visit: Payer: Self-pay | Admitting: Internal Medicine

## 2014-05-10 ENCOUNTER — Encounter: Payer: Self-pay | Admitting: Internal Medicine

## 2014-05-10 MED ORDER — ALENDRONATE SODIUM 70 MG PO TABS: 70.0000 mg | ORAL_TABLET | ORAL | Status: DC

## 2014-05-15 ENCOUNTER — Encounter: Payer: Self-pay | Admitting: Internal Medicine

## 2014-05-15 ENCOUNTER — Ambulatory Visit (HOSPITAL_COMMUNITY): Payer: Commercial Managed Care - HMO | Attending: Internal Medicine | Admitting: Cardiology

## 2014-05-15 DIAGNOSIS — R42 Dizziness and giddiness: Secondary | ICD-10-CM | POA: Diagnosis not present

## 2014-05-15 DIAGNOSIS — I6523 Occlusion and stenosis of bilateral carotid arteries: Secondary | ICD-10-CM | POA: Insufficient documentation

## 2014-05-15 NOTE — Progress Notes (Signed)
Carotid duplex performed 

## 2014-05-22 ENCOUNTER — Other Ambulatory Visit: Payer: Self-pay | Admitting: Internal Medicine

## 2014-07-10 ENCOUNTER — Telehealth: Payer: Self-pay

## 2014-07-10 NOTE — Telephone Encounter (Signed)
Call to the patient to introduce [facility] and the patient stated she would like to come in, but is getting ready to go off for a week. Will call and schedule when she gets back. Also stated [employer] [employer] is offering wellness programs but she is not sure what, but does have health goals she would like to work on. Took name and number to call and schedule when she returns.

## 2014-07-10 NOTE — Telephone Encounter (Signed)
[facility] that [facility] some [facility] programs. [facility] to discern what they offer. Called m[employer] services on this member's card and was told that the [facility] H[employer] plan does offer the silver sneaker program and may have an alternative if one cannot access silver sneakers. The patient can call [phone] to access more information .

## 2014-08-02 ENCOUNTER — Encounter: Payer: Self-pay | Admitting: Internal Medicine

## 2014-08-21 ENCOUNTER — Ambulatory Visit: Payer: Commercial Managed Care - HMO

## 2014-08-21 ENCOUNTER — Telehealth: Payer: Self-pay

## 2014-08-21 NOTE — Telephone Encounter (Signed)
Call to patient and Megan Baldwin thought the apt was at 12:15 today; is awaiting assistance to come to her home and fix her air conditioner. Apt rescheduled for 12:15pm tomorrow on Thursday the 23rd.

## 2014-08-22 ENCOUNTER — Ambulatory Visit (INDEPENDENT_AMBULATORY_CARE_PROVIDER_SITE_OTHER): Payer: Commercial Managed Care - HMO

## 2014-08-22 VITALS — BP 134/84 | Ht 63.0 in | Wt 194.0 lb

## 2014-08-22 DIAGNOSIS — Z0000 Encounter for general adult medical examination without abnormal findings: Secondary | ICD-10-CM

## 2014-08-22 NOTE — Patient Instructions (Addendum)
Megan Baldwin , Thank you for taking time to come for your Medicare Wellness Visit. I appreciate your ongoing commitment to your health goals. Please review the following plan we discussed and let me know if I can assist you in the future.   Will increase exercise in lieu of osteo dx. Will try to increase to 3 days a week Information given regarding food sources for Calcium intake per day Will check with insurance regarding  [facility] [facility] [employer] [facility]; [place], [place]    These are the goals we discussed: Goals    . Exercise 150 minutes per week (moderate activity)     Continue to build exercise program with weights but other strengthening activities as the you desire        This is a list of the screening recommended for you and due dates:  Health Maintenance  Topic Date Due  . Shingles Vaccine  05/07/1995  . Flu Shot  09/30/2014  . Tetanus Vaccine  08/10/2018  . Colon Cancer Screening  12/13/2018  . DEXA scan (bone density measurement)  Completed  . Pneumonia vaccines  Completed   Osteoporosis Throughout your life, your body breaks down old bone and replaces it with new bone. As you get older, your body does not replace bone as quickly as it breaks it down. By the age of 86 years, most people begin to gradually lose bone because of the imbalance between bone loss and replacement. Some people lose more bone than others. Bone loss beyond a specified normal degree is considered osteoporosis.  Osteoporosis affects the strength and durability of your bones. The inside of the ends of your bones and your flat bones, like the bones of your pelvis, look like honeycomb, filled with tiny open spaces. As bone loss occurs, your bones become less dense. This means that the open spaces inside your bones become bigger and the walls between these spaces become thinner. This makes your bones weaker.  Bones of a person with osteoporosis can become so weak that they can break (fracture) during minor accidents, such as a simple fall. CAUSES  The following factors have been associated with the development of osteoporosis:  Smoking.  Drinking more than 2 alcoholic drinks several days per week.  Long-term use of certain medicines:  Corticosteroids.  Chemotherapy medicines.  Thyroid medicines.  Antiepileptic medicines.  Gonadal hormone suppression medicine.  Immunosuppression medicine.  Being underweight.  Lack of physical activity.  Lack of exposure to the sun. This can lead to vitamin D deficiency.  Certain medical conditions:  Certain inflammatory bowel diseases, such as Crohn disease and ulcerative colitis.  Diabetes.  Hyperthyroidism.  Hyperparathyroidism. RISK FACTORS Anyone can develop osteoporosis. However, the following factors can increase your risk of developing osteoporosis:  Gender--Women are at higher risk than men.  Age--Being older than 50 years increases your risk.  Ethnicity--White and Asian people have an increased risk.  Weight --Being extremely underweight can increase your risk of osteoporosis.  Family history of osteoporosis--Having a family member who has developed osteoporosis can increase your risk. SYMPTOMS  Usually, people with osteoporosis have no symptoms.  DIAGNOSIS  Signs during a physical exam that may prompt your caregiver to suspect osteoporosis include:  Decreased height. This is usually caused by the compression of the bones that form your spine (vertebrae) because they have weakened and become fractured.  A curving or rounding of the upper back (kyphosis). To confirm signs  of osteoporosis, your caregiver may request a procedure that uses 2 low-dose X-ray beams with different levels of energy to measure your bone mineral density (dual-energy X-ray absorptiometry [DXA]). Also, your caregiver may check your level of vitamin  D. TREATMENT  The goal of osteoporosis treatment is to strengthen bones in order to decrease the risk of bone fractures. There are different types of medicines available to help achieve this goal. Some of these medicines work by slowing the processes of bone loss. Some medicines work by increasing bone density. Treatment also involves making sure that your levels of calcium and vitamin D are adequate. PREVENTION  There are things you can do to help prevent osteoporosis. Adequate intake of calcium and vitamin D can help you achieve optimal bone mineral density. Regular exercise can also help, especially resistance and weight-bearing activities. If you smoke, quitting smoking is an important part of osteoporosis prevention. MAKE SURE YOU:  Understand these instructions.  Will watch your condition.  Will get help right away if you are not doing well or get worse. FOR MORE INFORMATION www.osteo.org and [employer].ee Document Released: 11/25/2004 Document Revised: 06/12/2012 Document Reviewed: 01/30/2011 [facility] Patient Information 2015 [place], [place]. This information is not intended to replace advice given to you by your health care provider. Make sure you discuss any questions you have with your health care provider.     Fall Prevention and Home Safety Falls cause injuries and can affect all age groups. It is possible to prevent falls.  HOW TO PREVENT FALLS  Wear shoes with rubber soles that do not have an opening for your toes.  Keep the inside and outside of your house well lit.  Use night lights throughout your home.  Remove clutter from floors.  Clean up floor spills.  Remove throw rugs or fasten them to the floor with carpet tape.  Do not place electrical cords across pathways.  Put grab bars by your tub, shower, and toilet. Do not use towel bars as grab bars.  Put handrails on both sides of the stairway. Fix loose handrails.  Do not climb on stools or stepladders, if  possible.  Do not wax your floors.  Repair uneven or unsafe sidewalks, walkways, or stairs.  Keep items you use a lot within reach.  Be aware of pets.  Keep emergency numbers next to the telephone.  Put smoke detectors in your home and near bedrooms. Ask your doctor what other things you can do to prevent falls. Document Released: 12/12/2008 Document Revised: 08/17/2011 Document Reviewed: 05/18/2011 [facility] Patient Information 2015 [place], [place]. This information is not intended to replace advice given to you by your health care provider. Make sure you discuss any questions you have with your health care provider.  Health Maintenance Adopting a healthy lifestyle and getting preventive care can go a long way to promote health and wellness. Talk with your health care provider about what schedule of regular examinations is right for you. This is a good chance for you to check in with your provider about disease prevention and staying healthy. In between checkups, there are plenty of things you can do on your own. Experts have done a lot of research about which lifestyle changes and preventive measures are most likely to keep you healthy. Ask your health care provider for more information. WEIGHT AND DIET  Eat a healthy diet  Be sure to include plenty of vegetables, fruits, low-fat dairy products, and lean protein.  Do not eat a lot of foods high  in solid fats, added sugars, or salt.  Get regular exercise. This is one of the most important things you can do for your health.  Most adults should exercise for at least 150 minutes each week. The exercise should increase your heart rate and make you sweat (moderate-intensity exercise).  Most adults should also do strengthening exercises at least twice a week. This is in addition to the moderate-intensity exercise.  Maintain a healthy weight  Body mass index (BMI) is a measurement that can be used to identify possible weight problems. It  estimates body fat based on height and weight. Your health care provider can help determine your BMI and help you achieve or maintain a healthy weight.  For females 72 years of age and older:   A BMI below 18.5 is considered underweight.  A BMI of 18.5 to 24.9 is normal.  A BMI of 25 to 29.9 is considered overweight.  A BMI of 30 and above is considered obese.  Watch levels of cholesterol and blood lipids  You should start having your blood tested for lipids and cholesterol at 79 years of age, then have this test every 5 years.  You may need to have your cholesterol levels checked more often if:  Your lipid or cholesterol levels are high.  You are older than 79 years of age.  You are at high risk for heart disease.  CANCER SCREENING   Lung Cancer  Lung cancer screening is recommended for adults 43-12 years old who are at high risk for lung cancer because of a history of smoking.  A yearly low-dose CT scan of the lungs is recommended for people who:  Currently smoke.  Have quit within the past 15 years.  Have at least a 30-pack-year history of smoking. A pack year is smoking an average of one pack of cigarettes a day for 1 year.  Yearly screening should continue until it has been 15 years since you quit.  Yearly screening should stop if you develop a health problem that would prevent you from having lung cancer treatment.  Breast Cancer  Practice breast self-awareness. This means understanding how your breasts normally appear and feel.  It also means doing regular breast self-exams. Let your health care provider know about any changes, no matter how small.  If you are in your 20s or 30s, you should have a clinical breast exam (CBE) by a health care provider every 1-3 years as part of a regular health exam.  If you are 33 or older, have a CBE every year. Also consider having a breast X-ray (mammogram) every year.  If you have a family history of breast cancer, talk  to your health care provider about genetic screening.  If you are at high risk for breast cancer, talk to your health care provider about having an MRI and a mammogram every year.  Breast cancer gene (BRCA) assessment is recommended for women who have family members with BRCA-related cancers. BRCA-related cancers include:  Breast.  Ovarian.  Tubal.  Peritoneal cancers.  Results of the assessment will determine the need for genetic counseling and BRCA1 and BRCA2 testing. Cervical Cancer Routine pelvic examinations to screen for cervical cancer are no longer recommended for nonpregnant women who are considered low risk for cancer of the pelvic organs (ovaries, uterus, and vagina) and who do not have symptoms. A pelvic examination may be necessary if you have symptoms including those associated with pelvic infections. Ask your health care provider if a screening  pelvic exam is right for you.   The Pap test is the screening test for cervical cancer for women who are considered at risk.  If you had a hysterectomy for a problem that was not cancer or a condition that could lead to cancer, then you no longer need Pap tests.  If you are older than 65 years, and you have had normal Pap tests for the past 10 years, you no longer need to have Pap tests.  If you have had past treatment for cervical cancer or a condition that could lead to cancer, you need Pap tests and screening for cancer for at least 20 years after your treatment.  If you no longer get a Pap test, assess your risk factors if they change (such as having a new sexual partner). This can affect whether you should start being screened again.  Some women have medical problems that increase their chance of getting cervical cancer. If this is the case for you, your health care provider may recommend more frequent screening and Pap tests.  The human papillomavirus (HPV) test is another test that may be used for cervical cancer screening.  The HPV test looks for the virus that can cause cell changes in the cervix. The cells collected during the Pap test can be tested for HPV.  The HPV test can be used to screen women 15 years of age and older. Getting tested for HPV can extend the interval between normal Pap tests from three to five years.  An HPV test also should be used to screen women of any age who have unclear Pap test results.  After 79 years of age, women should have HPV testing as often as Pap tests.  Colorectal Cancer  This type of cancer can be detected and often prevented.  Routine colorectal cancer screening usually begins at 79 years of age and continues through 79 years of age.  Your health care provider may recommend screening at an earlier age if you have risk factors for colon cancer.  Your health care provider may also recommend using home test kits to check for hidden blood in the stool.  A small camera at the end of a tube can be used to examine your colon directly (sigmoidoscopy or colonoscopy). This is done to check for the earliest forms of colorectal cancer.  Routine screening usually begins at age 50.  Direct examination of the colon should be repeated every 5-10 years through 79 years of age. However, you may need to be screened more often if early forms of precancerous polyps or small growths are found. Skin Cancer  Check your skin from head to toe regularly.  Tell your health care provider about any new moles or changes in moles, especially if there is a change in a mole's shape or color.  Also tell your health care provider if you have a mole that is larger than the size of a pencil eraser.  Always use sunscreen. Apply sunscreen liberally and repeatedly throughout the day.  Protect yourself by wearing long sleeves, pants, a wide-brimmed hat, and sunglasses whenever you are outside. HEART DISEASE, DIABETES, AND HIGH BLOOD PRESSURE   Have your blood pressure checked at least every 1-2  years. High blood pressure causes heart disease and increases the risk of stroke.  If you are between 48 years and 35 years old, ask your health care provider if you should take aspirin to prevent strokes.  Have regular diabetes screenings. This involves taking a blood  sample to check your fasting blood sugar level.  If you are at a normal weight and have a low risk for diabetes, have this test once every three years after 79 years of age.  If you are overweight and have a high risk for diabetes, consider being tested at a younger age or more often. PREVENTING INFECTION  Hepatitis B  If you have a higher risk for hepatitis B, you should be screened for this virus. You are considered at high risk for hepatitis B if:  You were born in a country where hepatitis B is common. Ask your health care provider which countries are considered high risk.  Your parents were born in a high-risk country, and you have not been immunized against hepatitis B (hepatitis B vaccine).  You have HIV or AIDS.  You use needles to inject street drugs.  You live with someone who has hepatitis B.  You have had sex with someone who has hepatitis B.  You get hemodialysis treatment.  You take certain medicines for conditions, including cancer, organ transplantation, and autoimmune conditions. Hepatitis C  Blood testing is recommended for:  Everyone born from [birth year] through 1963.  Anyone with known risk factors for hepatitis C. Sexually transmitted infections (STIs)  You should be screened for sexually transmitted infections (STIs) including gonorrhea and chlamydia if:  You are sexually active and are younger than 79 years of age.  You are older than 79 years of age and your health care provider tells you that you are at risk for this type of infection.  Your sexual activity has changed since you were last screened and you are at an increased risk for chlamydia or gonorrhea. Ask your health care provider if  you are at risk.  If you do not have HIV, but are at risk, it may be recommended that you take a prescription medicine daily to prevent HIV infection. This is called pre-exposure prophylaxis (PrEP). You are considered at risk if:  You are sexually active and do not regularly use condoms or know the HIV status of your partner(s).  You take drugs by injection.  You are sexually active with a partner who has HIV. Talk with your health care provider about whether you are at high risk of being infected with HIV. If you choose to begin PrEP, you should first be tested for HIV. You should then be tested every 3 months for as long as you are taking PrEP.  PREGNANCY   If you are premenopausal and you may become pregnant, ask your health care provider about preconception counseling.  If you may become pregnant, take 400 to 800 micrograms (mcg) of folic acid every day.  If you want to prevent pregnancy, talk to your health care provider about birth control (contraception). OSTEOPOROSIS AND MENOPAUSE   Osteoporosis is a disease in which the bones lose minerals and strength with aging. This can result in serious bone fractures. Your risk for osteoporosis can be identified using a bone density scan.  If you are 40 years of age or older, or if you are at risk for osteoporosis and fractures, ask your health care provider if you should be screened.  Ask your health care provider whether you should take a calcium or vitamin D supplement to lower your risk for osteoporosis.  Menopause may have certain physical symptoms and risks.  Hormone replacement therapy may reduce some of these symptoms and risks. Talk to your health care provider about whether hormone replacement therapy is  right for you.  HOME CARE INSTRUCTIONS   Schedule regular health, dental, and eye exams.  Stay current with your immunizations.   Do not use any tobacco products including cigarettes, chewing tobacco, or electronic  cigarettes.  If you are pregnant, do not drink alcohol.  If you are breastfeeding, limit how much and how often you drink alcohol.  Limit alcohol intake to no more than 1 drink per day for nonpregnant women. One drink equals 12 ounces of beer, 5 ounces of wine, or 1 ounces of hard liquor.  Do not use street drugs.  Do not share needles.  Ask your health care provider for help if you need support or information about quitting drugs.  Tell your health care provider if you often feel depressed.  Tell your health care provider if you have ever been abused or do not feel safe at home. Document Released: 08/31/2010 Document Revised: 07/02/2013 Document Reviewed: 01/17/2013 [facility] Patient Information 2015 [place], [place]. This information is not intended to replace advice given to you by your health care provider. Make sure you discuss any questions you have with your health care provider.

## 2014-08-22 NOTE — Progress Notes (Addendum)
Subjective:   Megan Baldwin is a 81 y.o. female who presents for Medicare Annual (Subsequent) preventive examination.  Review of Systems:  HRA assessment completed during visit;  Patient is here for Annual Wellness Assessment The Patient was informed that this wellness visit is to identify risk and educate on how to reduce risk for increase disease through lifestyle changes.  The patient verbalized understanding that any voiced medical issues still need to be directed to their physician at a follow up visit as this is not a "physical exam" in which medical issues are addressed and treated. The goal of the wellness visit is to assist the patient how to close the gaps in care and create a preventative care plan for the patient.    Problem list review for risk HTN; AVR; COPD; OA; DDD; hyperlipidemia; osteoporosis; all managed with medical treatment currently Lipids 05/02/14; chol 202; trig 112; HDL 62; LDL 17; ratio 3 BMI: weight 200 for the longest time; 194; states she is cutting back; trying to eat healthy Diet; breakfast; cereal; oatmeal; alternate egg and bacon/ loves bread;  Lunch; largest meal; balanced;  Supper don't eat supper;  Exercise; Monday and Wednesdays; [facility];  Discuses childhood scar; on left lower leg; was told to strengthen leg;  Goal strengthen her legs; Also has osteoporosis and understands that she does not want it to get worse  Read SE of med; but states definitely that she is not going to take the medicine Declines apt with Dr. Jonny Ruiz. Educated on Strength building exercises Goal is continue to exercise; recommended x 3  Mentioned yoga and Pilates Also educated on slow    Family Hx almost in the family has Diabetes; and cancer of breast Social history:  Lives by herself; single family home with one floor; Plans on staying at home. Given fact sheet on home safety [facility]  Psychosocial support;  safe community; firearm safety; smoke alarms;   Discussed Goal to improve health based on risk  Cardiac Risk Factors include: advanced age (>73men, >1 women);dyslipidemia;obesity (BMI >30kg/m2)     Objective:     Vitals: BP 134/84 mmHg  Ht  (1.6 m)  Wt 194 lb (87.998 kg)  BMI 34.37 kg/m2  Tobacco History  Smoking status  . Former Smoker  Smokeless tobacco  . Not on file     Counseling given: Yes   Past Medical History  Diagnosis Date  . Hypertension   . Osteoporosis   . Anxiety   . Hyperlipidemia   . COPD (chronic obstructive pulmonary disease)   . Allergic rhinitis   . Gallstones   . DJD (degenerative joint disease)     LS spine  . Disc disease, degenerative, cervical   . History of colonic polyps   . Diverticulosis of colon   . Aortic valve regurgitation 10/12/2010   Past Surgical History  Procedure Laterality Date  . Abdominal hysterectomy    . Oophorectomy    . Colonoscopy w/ polypectomy     Family History  Problem Relation Age of Onset  . Stroke      F 1st degree relative  . Parkinsonism Father   . Cancer      1st degree relative <50  . Coronary artery disease      F 1st degree relative <60  . Diabetes      1st degree relative  . Hypertension    . Diabetes Mother   .  Cancer Mother    History  Sexual Activity  . Sexual Activity: Not on file    Outpatient Encounter Prescriptions as of 08/22/2014  Medication Sig  . aspirin 325 MG EC tablet Take 325 mg by mouth daily.    . diazepam (VALIUM) 5 MG tablet TAKE 1 TABLET BY MOUTH TWICE A DAY AS NEEDED  . diclofenac sodium (VOLTAREN) 1 % GEL Apply 1 application topically 4 (four) times daily.  . fexofenadine (ALLEGRA) 180 MG tablet Take 1 tablet (180 mg total) by mouth daily.  Marland Kitchen labetalol (NORMODYNE) 200 MG tablet Take 1 tablet (200 mg total) by mouth 2 (two) times daily.  Marland Kitchen labetalol (NORMODYNE) 200 MG tablet TAKE 1 TABLET (200 MG TOTAL) BY MOUTH 2 (TWO) TIMES DAILY.  Marland Kitchen spironolactone  (ALDACTONE) 50 MG tablet Take 1 tablet (50 mg total) by mouth 2 (two) times daily.  Marland Kitchen spironolactone (ALDACTONE) 50 MG tablet TAKE 1 TABLET (50 MG TOTAL) BY MOUTH 2 (TWO) TIMES DAILY.  Marland Kitchen alendronate (FOSAMAX) 70 MG tablet Take 1 tablet (70 mg total) by mouth every 7 (seven) days. Take with a full glass of water on an empty stomach. (Patient not taking: Reported on 08/22/2014)   No facility-administered encounter medications on file as of 08/22/2014.    Activities of Daily Living In your present state of health, do you have any difficulty performing the following activities: 08/22/2014 05/03/2014  Hearing? N N  Vision? N N  Difficulty concentrating or making decisions? N N  Walking or climbing stairs? Y N  Dressing or bathing? N N  Doing errands, shopping? N N  Preparing Food and eating ? N -  Using the Toilet? N -  In the past six months, have you accidently leaked urine? N -  Do you have problems with loss of bowel control? N -  Managing your Medications? N -  Managing your Finances? N -  Housekeeping or managing your Housekeeping? N -    Patient Care Team: Corwin Levins, MD as PCP - General    Assessment:   Objective:  The Patient was informed that this wellness visit is to identify risk and educate on how to reduce risk for increase disease through lifestyle changes.  The patient verbalized understanding that any voiced medical issues still need to be directed to their physician at a follow up visit as this is not a "physical exam" in which medical issues are addressed and treated. The goal of the wellness visit is to assist the patient how to close the gaps in care and create a preventative care plan for the patient.  Personalized Education was given regarding:   Pt determined a personalized goal; see patient goals;  Assessment included: smoking (secondary) educated as appropriate; including LDCT if as appropriate   Bone density scan as appropriate  Calcium and Vit D as  appropriate/ Osteoporosis risk reviewed/  Not taking fosamax; ; will check with Dr. Lilly Cove on vit d and how much to take. She is on 5000 now Does continue to eat some foods in calcium and given a list of food and amount of calcium in each from the [facility] site.   Taking meds without issues; no barriers identified Labs were and fup visit noted with MD if labs are due to be re-drawn. Stress: Recommendations for managing stress if assessed as a factor;  No Risk for hepatitis or high risk social behavior identified via hepatitis screen/ not reviewed at this session due to time  Educated on shingles  and follow up with insurance company for co-pays or charges applied to Part D benefit.   Safety issues reviewed;Has one level home and plans to age in place there Cognition assessed by AD8; Score 0  Screenings overdue Ophthalmology exam; will schedule one; [facility] Immunizations; up to date/ did discuss Shingles and educated on what to ask the insurer. The patient voiced understanding Colonoscopy;  completed; aged out EKG / deferred; had echo and carotid duplex Dexa scan; 05/09/2014; L-S -1.7/ Dr. Jonny Ruiz;   Mammogram 12/18/2013- No mammographic evidence of malignancy  Hearing: good  in both ears Dental: regular visits    Exercise Activities and Dietary recommendations Current Exercise Habits:: Structured exercise class, Time (Minutes): 45, Frequency (Times/Week): 3, Weekly Exercise (Minutes/Week): 135, Intensity: Moderate  Goals    . Exercise 150 minutes per week (moderate activity)     Continue to build exercise program with weights but other strengthening activities as the you desire       Fall Risk Fall Risk  08/22/2014 05/03/2014 05/03/2014 04/10/2013  Falls in the past year? No No No No   Depression Screen PHQ 2/9 Scores 08/22/2014 05/03/2014 04/10/2013  PHQ - 2 Score 0 1 0     Cognitive Testing MMSE - Mini Mental State Exam 08/22/2014  Not completed: Unable to complete      Immunization History  Administered Date(s) Administered  . Influenza Split 01/18/2011, 11/25/2011  . Influenza Whole 12/07/2007, 12/10/2008  . Influenza,inj,Quad PF,36+ Mos 11/29/2012, 12/19/2013  . Pneumococcal Conjugate-13 12/21/2012  . Pneumococcal Polysaccharide-23 11/30/2006  . Td 03/01/1997, 08/09/2008   Screening Tests Health Maintenance  Topic Date Due  . ZOSTAVAX  05/07/1995  . INFLUENZA VACCINE  09/30/2014  . TETANUS/TDAP  08/10/2018  . COLONOSCOPY  12/13/2018  . DEXA SCAN  Completed  . PNA vac Low Risk Adult  Completed      Plan:    Plan   The patient agrees to: Increase exercise to 3 days a week Agrees to discuss osteo decision to not take her medication if Dr. Jonny Ruiz wants to see her to discuss her report and how "bad" my osteo is;    Advanced directive: given information    During the course of the visit the patient was educated and counseled about the following appropriate screening and preventive services which are up to date except for shingles. Patient referred to insurance company for information and given questions to ask.  Vaccines to include Pneumoccal, Influenza, Hepatitis B, Td, Zostavax, HCV/ Lives alone; no risk verbalized;   Electrocardiogram;   Cardiovascular Disease  Colorectal cancer screening  Bone density screening  Diabetes screening  Glaucoma screening  Mammography/PAP  Nutrition counseling   Patient Instructions (the written plan) was given to the patient.   Montine Circle, RN  08/22/2014   Medical screening examination/treatment/procedure(s) were performed by non-physician practitioner and as supervising physician I was immediately available for consultation/collaboration. I agree with above. Oliver Barre, MD

## 2014-12-20 ENCOUNTER — Ambulatory Visit: Payer: Commercial Managed Care - HMO

## 2015-01-02 ENCOUNTER — Ambulatory Visit: Payer: Commercial Managed Care - HMO

## 2015-01-03 ENCOUNTER — Ambulatory Visit (INDEPENDENT_AMBULATORY_CARE_PROVIDER_SITE_OTHER): Payer: Commercial Managed Care - HMO

## 2015-01-03 ENCOUNTER — Telehealth: Payer: Self-pay

## 2015-01-03 DIAGNOSIS — E119 Type 2 diabetes mellitus without complications: Secondary | ICD-10-CM

## 2015-01-03 DIAGNOSIS — Z23 Encounter for immunization: Secondary | ICD-10-CM | POA: Diagnosis not present

## 2015-01-03 NOTE — Telephone Encounter (Signed)
Patient's insurance co is requiring that she have a referral for foot and eye care---she is wanting to stay within [facility] if possible---and be referred to someone who is close to this area or around [place] area---can you please send referral to podiatry and eye care doctor either within [facility] system or someone you would strongly recommend as a good physician ----please advise, i will call patient back, thanks

## 2015-01-04 NOTE — Telephone Encounter (Signed)
Referrals done.

## 2015-01-04 NOTE — Addendum Note (Signed)
Addended by: Corwin LevinsJOHN, Harly Pipkins W on: 01/04/2015 06:55 PM   Modules accepted: Orders

## 2015-01-09 ENCOUNTER — Telehealth: Payer: Self-pay | Admitting: Internal Medicine

## 2015-01-09 DIAGNOSIS — M79673 Pain in unspecified foot: Secondary | ICD-10-CM

## 2015-01-09 DIAGNOSIS — H538 Other visual disturbances: Secondary | ICD-10-CM

## 2015-01-09 NOTE — Telephone Encounter (Signed)
Notified pt md has place referrals...Raechel Chute/lmb

## 2015-01-09 NOTE — Telephone Encounter (Signed)
Pt request new referral for podiatrics for ingrown toenail enter from Dr. Jonny RuizJohn. Pt also need [employer] referral to go to [facility] ( [address]), pt need an annual follow but [employer] need to authorized first. Please call pt once its done.

## 2015-01-09 NOTE — Telephone Encounter (Signed)
Referrals done.

## 2015-01-20 DIAGNOSIS — Z961 Presence of intraocular lens: Secondary | ICD-10-CM | POA: Diagnosis not present

## 2015-01-20 DIAGNOSIS — H2512 Age-related nuclear cataract, left eye: Secondary | ICD-10-CM | POA: Diagnosis not present

## 2015-01-20 DIAGNOSIS — H35041 Retinal micro-aneurysms, unspecified, right eye: Secondary | ICD-10-CM | POA: Diagnosis not present

## 2015-01-22 ENCOUNTER — Encounter: Payer: Self-pay | Admitting: Podiatry

## 2015-01-22 ENCOUNTER — Ambulatory Visit (INDEPENDENT_AMBULATORY_CARE_PROVIDER_SITE_OTHER): Payer: Commercial Managed Care - HMO | Admitting: Podiatry

## 2015-01-22 VITALS — BP 125/70 | HR 64 | Resp 12

## 2015-01-22 DIAGNOSIS — M79674 Pain in right toe(s): Secondary | ICD-10-CM | POA: Diagnosis not present

## 2015-01-22 DIAGNOSIS — M79675 Pain in left toe(s): Secondary | ICD-10-CM

## 2015-01-22 DIAGNOSIS — B351 Tinea unguium: Secondary | ICD-10-CM | POA: Diagnosis not present

## 2015-01-22 NOTE — Patient Instructions (Signed)
Today I debrided the thick deformed fungal toenails on your right and left feet. The final infection will continue. Return as needed for debridement Other options include oral medication to actively treat fungal infection, however, the deformity was still would remain Topical medications for toenail fungus generally do not work

## 2015-01-22 NOTE — Progress Notes (Signed)
   Subjective:    Patient ID: Megan Baldwin, female    DOB: 05/24/1933, 81 y.o.   MRN: [id number]  HPI   This patient presents today complaining of painful hallux toenails bilaterally over the last 5 years gradually increasing discomfort in the last several months. She describes self treatment of these nails, however, the hallux nails remain painful. She describes pressure and discomfort in the hallux toenails walking wearing shoes in the hallux toenails and is requesting debridement of these nails.   Review of Systems  HENT: Positive for sinus pressure.        Objective:   Physical Exam  Orientated 3 patient presents with her daughter was in the treatment room  Vascular: No peripheral edema noted bilaterally DP pulses 2/4 bilaterally PT pulses 1/4 bilaterally Capillary reflex immediate bilaterally  Neurological: Sensation to 10 g monofilament wire intact 3/5 bilaterally Vibratory sensation intact bilaterally Ankle reflex equal and reactive bilaterally  Dermatological: No open skin lesions bilaterally The hallux toenails are hypertrophic discolored and incurvated on the medial lateral borders. There is no surrounding erythema, edema or drainage around the hallux nails No skin lesions bilaterally  Musculoskeletal: No deformities bilaterally No restriction ankle, subtalar, midtarsal joints bilaterally        Assessment & Plan:   Assessment: Dissecting neurovascular status Symptomatic mycotic toenails 2  Plan: Today review the results of examination with patient today and offered her nail debridement and she verbally consents. The hallux toenails were debrided mechanically and electrically without any bleeding  Reappoint at patient's request

## 2015-02-26 ENCOUNTER — Ambulatory Visit (INDEPENDENT_AMBULATORY_CARE_PROVIDER_SITE_OTHER): Payer: Commercial Managed Care - HMO | Admitting: Internal Medicine

## 2015-02-26 ENCOUNTER — Encounter: Payer: Self-pay | Admitting: Internal Medicine

## 2015-02-26 ENCOUNTER — Ambulatory Visit (INDEPENDENT_AMBULATORY_CARE_PROVIDER_SITE_OTHER)
Admission: RE | Admit: 2015-02-26 | Discharge: 2015-02-26 | Disposition: A | Discharge status: Home / Self Care | Payer: Commercial Managed Care - HMO | Source: Ambulatory Visit | Attending: Internal Medicine | Admitting: Internal Medicine

## 2015-02-26 VITALS — BP 110/78 | HR 75 | Temp 97.8°F | Ht 63.0 in | Wt 197.0 lb

## 2015-02-26 DIAGNOSIS — I10 Essential (primary) hypertension: Secondary | ICD-10-CM | POA: Diagnosis not present

## 2015-02-26 DIAGNOSIS — M25551 Pain in right hip: Secondary | ICD-10-CM | POA: Diagnosis not present

## 2015-02-26 DIAGNOSIS — M8580 Other specified disorders of bone density and structure, unspecified site: Secondary | ICD-10-CM

## 2015-02-26 DIAGNOSIS — J449 Chronic obstructive pulmonary disease, unspecified: Secondary | ICD-10-CM | POA: Diagnosis not present

## 2015-02-26 DIAGNOSIS — M160 Bilateral primary osteoarthritis of hip: Secondary | ICD-10-CM | POA: Diagnosis not present

## 2015-02-26 DIAGNOSIS — E785 Hyperlipidemia, unspecified: Secondary | ICD-10-CM | POA: Diagnosis not present

## 2015-02-26 DIAGNOSIS — Z0189 Encounter for other specified special examinations: Secondary | ICD-10-CM

## 2015-02-26 DIAGNOSIS — M25552 Pain in left hip: Secondary | ICD-10-CM | POA: Diagnosis not present

## 2015-02-26 DIAGNOSIS — Z0000 Encounter for general adult medical examination without abnormal findings: Secondary | ICD-10-CM

## 2015-02-26 MED ORDER — SPIRONOLACTONE 50 MG PO TABS: 50.0000 mg | ORAL_TABLET | Freq: Two times a day (BID) | ORAL | Status: DC

## 2015-02-26 MED ORDER — LABETALOL HCL 200 MG PO TABS: 200.0000 mg | ORAL_TABLET | Freq: Two times a day (BID) | ORAL | Status: DC

## 2015-02-26 MED ORDER — FEXOFENADINE HCL 180 MG PO TABS: 180.0000 mg | ORAL_TABLET | Freq: Every day | ORAL | Status: DC

## 2015-02-26 NOTE — Assessment & Plan Note (Addendum)
stable overall by history and exam, recent data reviewed with pt, and pt to continue medical treatment as before,  to f/u any worsening symptoms or concerns/. BP Readings from Last 3 Encounters:  02/26/15 110/78  01/22/15 125/70  08/22/14 134/84   ECG reviewed as per emr

## 2015-02-26 NOTE — Progress Notes (Signed)
Subjective:    Patient ID: Megan Baldwin, female    DOB: 08/05/1933, 81 y.o.   MRN: [id number]  HPI  Here to f/u; overall doing ok,  Pt denies chest pain, increasing sob or doe, wheezing, orthopnea, PND, increased LE swelling, palpitations, dizziness or syncope.  Pt denies new neurological symptoms such as new headache, or facial or extremity weakness or numbness.  Pt denies polydipsia, polyuria, or low sugar episode.   Pt denies new neurological symptoms such as new headache, or facial or extremity weakness or numbness.   Pt states overall good compliance with meds, mostly trying to follow appropriate diet, with wt overall stable,  but little exercise however. Still with bilat groin pain, volt gel helps but sometimes pain increased to walking and tries to giveaway, no falls. Last Dxa mar 2016, with osteopenia, did not want to try the fosamax.   Past Medical History  Diagnosis Date  . Hypertension   . Osteoporosis   . Anxiety   . Hyperlipidemia   . COPD (chronic obstructive pulmonary disease) (HCC)   . Allergic rhinitis   . Gallstones   . DJD (degenerative joint disease)     LS spine  . Disc disease, degenerative, cervical   . History of colonic polyps   . Diverticulosis of colon   . Aortic valve regurgitation 10/12/2010   Past Surgical History  Procedure Laterality Date  . Abdominal hysterectomy    . Oophorectomy    . Colonoscopy w/ polypectomy      reports that she has quit smoking. She does not have any smokeless tobacco history on file. She reports that she does not drink alcohol or use illicit drugs. family history includes Cancer in her mother; Diabetes in her mother; Parkinsonism in her father. No Known Allergies Current Outpatient Prescriptions on File Prior to Visit  Medication Sig Dispense Refill  . aspirin 325 MG EC tablet Take 325 mg by mouth daily.      . diazepam (VALIUM) 5 MG tablet TAKE 1 TABLET BY MOUTH TWICE A DAY AS NEEDED 60 tablet 2  . diclofenac sodium  (VOLTAREN) 1 % GEL Apply 1 application topically 4 (four) times daily. 1 Tube 11   No current facility-administered medications on file prior to visit.   Review of Systems  Constitutional: Negative for unusual diaphoresis or night sweats HENT: Negative for ringing in ear or discharge Eyes: Negative for double vision or worsening visual disturbance.  Respiratory: Negative for choking and stridor.   Gastrointestinal: Negative for vomiting or other signifcant bowel change Genitourinary: Negative for hematuria or change in urine volume.  Musculoskeletal: Negative for other MSK pain or swelling Skin: Negative for color change and worsening wound.  Neurological: Negative for tremors and numbness other than noted  Psychiatric/Behavioral: Negative for decreased concentration or agitation other than above       Objective:   Physical Exam BP 110/78 mmHg  Pulse 75  Temp(Src) 97.8 F (36.6 C) (Oral)  Ht 5\' 3"  (1.6 m)  Wt 197 lb (89.359 kg)  BMI 34.91 kg/m2  SpO2 97% VS noted, not ill appearing Constitutional: Pt appears in no significant distress HENT: Head: NCAT.  Right Ear: External ear normal.  Left Ear: External ear normal.  Eyes: . Pupils are equal, round, and reactive to light. Conjunctivae and EOM are normal Neck: Normal range of motion. Neck supple.  Cardiovascular: Normal rate and regular rhythm.   Pulmonary/Chest: Effort normal and breath sounds without rales or wheezing.  Abd:  Soft, NT,  ND, + BS Right and left hips with reduced ROM, and pain with internal rotation Neurological: Pt is alert. Not confused , motor grossly intact Skin: Skin is warm. No rash, no LE edema Psychiatric: Pt behavior is normal. No agitation.     Assessment & Plan:

## 2015-02-26 NOTE — Patient Instructions (Signed)
Please continue all other medications as before, and refills have been done if requested.  Please have the pharmacy call with any other refills you may need.  Please continue your efforts at being more active, low cholesterol diet, and weight control.  You are otherwise up to date with prevention measures today.  Please keep your appointments with your specialists as you may have planned  Please go to the XRAY Department in the Basement (go straight as you get off the elevator) for the x-ray testing  You will be contacted by phone if any changes need to be made immediately.  Otherwise, you will receive a letter about your results with an explanation, but please check with MyChart first.  Please remember to sign up for MyChart if you have not done so, as this will be important to you in the future with finding out test results, communicating by private email, and scheduling acute appointments online when needed.  Please return in 3 months, or sooner if needed, with Lab testing done 3-5 days before

## 2015-02-26 NOTE — Assessment & Plan Note (Signed)
worsenig recent, for hip films, and refer [facility] -  to f/u any worsening symptoms or concerns, declines pain med

## 2015-02-26 NOTE — Addendum Note (Signed)
Addended by: Corwin LevinsJOHN, Breindy Meadow W on: 02/26/2015 06:38 PM   Modules accepted: Kipp BroodSmartSet

## 2015-02-26 NOTE — Progress Notes (Signed)
Pre visit review using our clinic review tool, if applicable. No additional management support is needed unless otherwise documented below in the visit note. 

## 2015-02-26 NOTE — Assessment & Plan Note (Signed)
stable overall by history and exam, recent data reviewed with pt, and pt to continue medical treatment as before,  to f/u any worsening symptoms or concerns Lab Results  Component Value Date   LDLCALC 117* 05/02/2014   For better lower chol diet, consider statin for worsening, goal < 100

## 2015-02-26 NOTE — Assessment & Plan Note (Signed)
dxa mar 2016 d/w pt, declines fosamax for now due to perceived risk

## 2015-02-26 NOTE — Assessment & Plan Note (Signed)
stable overall by history and exam, recent data reviewed with pt, and pt to continue medical treatment as before,  to f/u any worsening symptoms or concerns SpO2 Readings from Last 3 Encounters:  02/26/15 97%  05/03/14 97%  01/04/14 98%

## 2015-02-27 ENCOUNTER — Encounter: Payer: Self-pay | Admitting: Internal Medicine

## 2015-03-14 ENCOUNTER — Other Ambulatory Visit: Payer: Self-pay | Admitting: Internal Medicine

## 2015-03-14 NOTE — Telephone Encounter (Signed)
Done hardcopy to Dahlia  

## 2015-05-27 ENCOUNTER — Ambulatory Visit (INDEPENDENT_AMBULATORY_CARE_PROVIDER_SITE_OTHER): Payer: Commercial Managed Care - HMO | Admitting: Internal Medicine

## 2015-05-27 ENCOUNTER — Encounter: Payer: Self-pay | Admitting: Internal Medicine

## 2015-05-27 ENCOUNTER — Other Ambulatory Visit (INDEPENDENT_AMBULATORY_CARE_PROVIDER_SITE_OTHER): Payer: Commercial Managed Care - HMO

## 2015-05-27 VITALS — BP 128/68 | HR 72 | Temp 98.0°F | Resp 20 | Wt 199.0 lb

## 2015-05-27 DIAGNOSIS — Z0000 Encounter for general adult medical examination without abnormal findings: Secondary | ICD-10-CM

## 2015-05-27 DIAGNOSIS — E785 Hyperlipidemia, unspecified: Secondary | ICD-10-CM

## 2015-05-27 DIAGNOSIS — J449 Chronic obstructive pulmonary disease, unspecified: Secondary | ICD-10-CM

## 2015-05-27 DIAGNOSIS — I10 Essential (primary) hypertension: Secondary | ICD-10-CM

## 2015-05-27 DIAGNOSIS — M79605 Pain in left leg: Secondary | ICD-10-CM

## 2015-05-27 DIAGNOSIS — M25512 Pain in left shoulder: Secondary | ICD-10-CM | POA: Diagnosis not present

## 2015-05-27 LAB — CBC WITH DIFFERENTIAL/PLATELET
BASOS PCT: 0.4 % (ref 0.0–3.0)
Basophils Absolute: 0 10*3/uL (ref 0.0–0.1)
EOS PCT: 4 % (ref 0.0–5.0)
Eosinophils Absolute: 0.2 10*3/uL (ref 0.0–0.7)
HCT: 40.8 % (ref 36.0–46.0)
HEMOGLOBIN: 13.7 g/dL (ref 12.0–15.0)
LYMPHS ABS: 2.3 10*3/uL (ref 0.7–4.0)
Lymphocytes Relative: 38.9 % (ref 12.0–46.0)
MCHC: 33.6 g/dL (ref 30.0–36.0)
MCV: 87.7 fl (ref 78.0–100.0)
MONO ABS: 0.6 10*3/uL (ref 0.1–1.0)
Monocytes Relative: 10.7 % (ref 3.0–12.0)
Neutro Abs: 2.7 10*3/uL (ref 1.4–7.7)
Neutrophils Relative %: 46 % (ref 43.0–77.0)
Platelets: 157 10*3/uL (ref 150.0–400.0)
RBC: 4.65 Mil/uL (ref 3.87–5.11)
RDW: 13.3 % (ref 11.5–15.5)
WBC: 5.9 10*3/uL (ref 4.0–10.5)

## 2015-05-27 LAB — BASIC METABOLIC PANEL
BUN: 19 mg/dL (ref 6–23)
CO2: 24 mEq/L (ref 19–32)
Calcium: 10 mg/dL (ref 8.4–10.5)
Chloride: 104 mEq/L (ref 96–112)
Creatinine, Ser: 1.3 mg/dL — ABNORMAL HIGH (ref 0.40–1.20)
GFR: 50.68 mL/min — AB (ref >60.00)
Glucose, Bld: 85 mg/dL (ref 70–99)
POTASSIUM: 4.1 meq/L (ref 3.5–5.1)
SODIUM: 138 meq/L (ref 135–145)

## 2015-05-27 LAB — LIPID PANEL
CHOL/HDL RATIO: 4
CHOLESTEROL: 221 mg/dL — AB (ref 0–200)
HDL: 58.2 mg/dL (ref >39.00)
LDL CALC: 131 mg/dL — AB (ref 0–99)
NonHDL: 162.67
Triglycerides: 158 mg/dL — ABNORMAL HIGH (ref 0.0–149.0)
VLDL: 31.6 mg/dL (ref 0.0–40.0)

## 2015-05-27 LAB — URINALYSIS, ROUTINE W REFLEX MICROSCOPIC
BILIRUBIN URINE: NEGATIVE
Hgb urine dipstick: NEGATIVE
Ketones, ur: NEGATIVE
NITRITE: NEGATIVE
RBC / HPF: NONE SEEN (ref 0–?)
Specific Gravity, Urine: 1.025 (ref 1.000–1.030)
TOTAL PROTEIN, URINE-UPE24: NEGATIVE
URINE GLUCOSE: NEGATIVE
Urobilinogen, UA: 0.2 (ref 0.0–1.0)
pH: 6 (ref 5.0–8.0)

## 2015-05-27 LAB — HEPATIC FUNCTION PANEL
ALK PHOS: 57 U/L (ref 39–117)
ALT: 20 U/L (ref 0–35)
AST: 20 U/L (ref 0–37)
Albumin: 4.5 g/dL (ref 3.5–5.2)
BILIRUBIN TOTAL: 0.5 mg/dL (ref 0.2–1.2)
Bilirubin, Direct: 0.1 mg/dL (ref 0.0–0.3)
Total Protein: 8.2 g/dL (ref 6.0–8.3)

## 2015-05-27 LAB — TSH: TSH: 0.8 u[IU]/mL (ref 0.35–4.50)

## 2015-05-27 MED ORDER — ALBUTEROL SULFATE HFA 108 (90 BASE) MCG/ACT IN AERS: 2.0000 | INHALATION_SPRAY | Freq: Four times a day (QID) | RESPIRATORY_TRACT | Status: DC | PRN

## 2015-05-27 NOTE — Patient Instructions (Signed)
Please take all new medication as prescribed - the inhaler as needed  Please continue all other medications as before, and refills have been done if requested.  Please have the pharmacy call with any other refills you may need.  Please continue your efforts at being more active, low cholesterol diet, and weight control.  You are otherwise up to date with prevention measures today.  Please keep your appointments with your specialists as you may have planned  Please go to the LAB in the Basement (turn left off the elevator) for the tests to be done today  You will be contacted by phone if any changes need to be made immediately.  Otherwise, you will receive a letter about your results with an explanation, but please check with MyChart first.  Please remember to sign up for MyChart if you have not done so, as this will be important to you in the future with finding out test results, communicating by private email, and scheduling acute appointments online when needed.  Please return in 1 year for your yearly visit, or sooner if needed, with Lab testing done 3-5 days before

## 2015-05-27 NOTE — Progress Notes (Signed)
Subjective:    Patient ID: Megan Baldwin, female    DOB: 1933/08/14, 82 y.o.   MRN: [id number]  HPI  Here for wellness and f/u;  Overall doing ok;  Pt denies Chest pain, worsening SOB, DOE, wheezing, orthopnea, PND, worsening LE edema, palpitations, dizziness or syncope, except for occas dyspnea, asks for new inhaler for her COPD. No cough, fever , ST.  Pt denies neurological change such as new headache, facial or extremity weakness.  Pt denies polydipsia, polyuria, or low sugar symptoms. Pt states overall good compliance with treatment and medications, good tolerability, and has been trying to follow appropriate diet.  Pt denies worsening depressive symptoms, suicidal ideation or panic. No fever, night sweats, wt loss, loss of appetite, or other constitutional symptoms.  Pt states good ability with ADL's, has low fall risk, home safety reviewed and adequate, no other significant changes in hearing or vision, and only occasionally active with exercise. Still has ongoing distal left leg pain worse just below the knee, asks for ortho eval, as missed last referral. Due for mammogram.  Also c/o left shoulder pain with reduced rom to rasiing overhead, has increased pain to doing this, better to not do this, for many months, mild to mod. Pt continues to have recurring LBP without change in severity, bowel or bladder change, fever, wt loss,  worsening LE pain/numbness/weakness, gait change or falls. Past Medical History  Diagnosis Date  . Hypertension   . Osteoporosis   . Anxiety   . Hyperlipidemia   . COPD (chronic obstructive pulmonary disease) (HCC)   . Allergic rhinitis   . Gallstones   . DJD (degenerative joint disease)     LS spine  . Disc disease, degenerative, cervical   . History of colonic polyps   . Diverticulosis of colon   . Aortic valve regurgitation 10/12/2010   Past Surgical History  Procedure Laterality Date  . Abdominal hysterectomy    . Oophorectomy    . Colonoscopy w/  polypectomy      reports that she has quit smoking. She does not have any smokeless tobacco history on file. She reports that she does not drink alcohol or use illicit drugs. family history includes Cancer in her mother; Diabetes in her mother; Parkinsonism in her father. No Known Allergies Current Outpatient Prescriptions on File Prior to Visit  Medication Sig Dispense Refill  . aspirin 325 MG EC tablet Take 325 mg by mouth daily.      . diazepam (VALIUM) 5 MG tablet TAKE 1 TABLET BY MOUTH TWICE A DAY AS NEEDED 60 tablet 1  . diclofenac sodium (VOLTAREN) 1 % GEL Apply 1 application topically 4 (four) times daily. 1 Tube 11  . fexofenadine (ALLEGRA) 180 MG tablet Take 1 tablet (180 mg total) by mouth daily. 90 tablet 3  . labetalol (NORMODYNE) 200 MG tablet Take 1 tablet (200 mg total) by mouth 2 (two) times daily. 180 tablet 3  . spironolactone (ALDACTONE) 50 MG tablet Take 1 tablet (50 mg total) by mouth 2 (two) times daily. 180 tablet 3   No current facility-administered medications on file prior to visit.   Review of Systems Constitutional: Negative for increased diaphoresis, or other activity, appetite or siginficant weight change other than noted HENT: Negative for worsening hearing loss, ear pain, facial swelling, mouth sores and neck stiffness.   Eyes: Negative for other worsening pain, redness or visual disturbance.  Respiratory: Negative for choking or stridor Cardiovascular: Negative for other chest pain and palpitations.  Gastrointestinal: Negative for worsening diarrhea, blood in stool, or abdominal distention Genitourinary: Negative for hematuria, flank pain or change in urine volume.  Musculoskeletal: Negative for myalgias or other joint complaints.  Skin: Negative for other color change and wound or drainage.  Neurological: Negative for syncope and numbness. other than noted Hematological: Negative for adenopathy. or other swelling Psychiatric/Behavioral: Negative for  hallucinations, SI, self-injury, decreased concentration or other worsening agitation.      Objective:   Physical Exam BP 128/68 mmHg  Pulse 72  Temp(Src) 98 F (36.7 C) (Oral)  Resp 20  Wt 199 lb (90.266 kg)  SpO2 95% VS noted,  Constitutional: Pt is oriented to person, place, and time. Appears well-developed and well-nourished, in no significant distress Head: Normocephalic and atraumatic  Eyes: Conjunctivae and EOM are normal. Pupils are equal, round, and reactive to light Right Ear: External ear normal.  Left Ear: External ear normal Nose: Nose normal.  Mouth/Throat: Oropharynx is clear and moist  Neck: Normal range of motion. Neck supple. No JVD present. No tracheal deviation present or significant neck LA or mass Cardiovascular: Normal rate, regular rhythm, normal heart sounds and intact distal pulses.   Pulmonary/Chest: Effort normal and breath sounds without rales or wheezing  Abdominal: Soft. Bowel sounds are normal. NT. No HSM  Musculoskeletal: Normal range of motion. Exhibits no edema Lymphadenopathy: Has no cervical adenopathy.  Neurological: Pt is alert and oriented to person, place, and time. Pt has normal reflexes. No cranial nerve deficit. Motor grossly intact Skin: Skin is warm and dry. No rash noted or new ulcers Psychiatric:  Has normal mood and affect. Behavior is normal.  Spine nontender, left shoudler NT but unable to actively raise > 90 degrees, LLE NT and o/w neurovasc intact    Assessment & Plan:

## 2015-05-27 NOTE — Progress Notes (Signed)
Pre visit review using our clinic review tool, if applicable. No additional management support is needed unless otherwise documented below in the visit note. 

## 2015-05-28 ENCOUNTER — Other Ambulatory Visit: Payer: Self-pay | Admitting: Internal Medicine

## 2015-05-28 ENCOUNTER — Encounter: Payer: Self-pay | Admitting: Internal Medicine

## 2015-05-28 MED ORDER — ROSUVASTATIN CALCIUM 10 MG PO TABS: 10.0000 mg | ORAL_TABLET | Freq: Every day | ORAL | Status: DC

## 2015-05-30 NOTE — Assessment & Plan Note (Signed)
Etiology unclear, exam benign, for tylenol prn,  to f/u any worsening symptoms or concerns

## 2015-05-30 NOTE — Assessment & Plan Note (Signed)
stable overall by history and exam, recent data reviewed with pt, and pt to continue medical treatment as before,  to f/u any worsening symptoms or concerns BP Readings from Last 3 Encounters:  05/27/15 128/68  02/26/15 110/78  01/22/15 125/70

## 2015-05-30 NOTE — Assessment & Plan Note (Addendum)
Abnormal exam, possible rot cuff dz, encouraged pt to f/u with Dr Smith/[facility]  In addition to the time spent performing CPE, I spent an additional 40 minutes face to face,in which greater than 50% of this time was spent in counseling and coordination of care for patient's acute illness as documented.

## 2015-05-30 NOTE — Assessment & Plan Note (Signed)
Ok for albut MDI prn, o/w stable overall by history and exam, recent data reviewed with pt, and pt to continue medical treatment as before,  to f/u any worsening symptoms or concerns SpO2 Readings from Last 3 Encounters:  05/27/15 95%  02/26/15 97%  05/03/14 97%

## 2015-05-30 NOTE — Assessment & Plan Note (Signed)
stable overall by history and exam,  and pt to continue medical treatment as before,  to f/u any worsening symptoms or concerns, consider statin for LDL > 100 

## 2015-05-30 NOTE — Assessment & Plan Note (Signed)

## 2015-06-12 DIAGNOSIS — M898X6 Other specified disorders of bone, lower leg: Secondary | ICD-10-CM | POA: Diagnosis not present

## 2015-06-12 DIAGNOSIS — S46812A Strain of other muscles, fascia and tendons at shoulder and upper arm level, left arm, initial encounter: Secondary | ICD-10-CM | POA: Diagnosis not present

## 2015-06-24 DIAGNOSIS — S46912D Strain of unspecified muscle, fascia and tendon at shoulder and upper arm level, left arm, subsequent encounter: Secondary | ICD-10-CM | POA: Diagnosis not present

## 2015-07-01 DIAGNOSIS — M7502 Adhesive capsulitis of left shoulder: Secondary | ICD-10-CM | POA: Diagnosis not present

## 2015-09-08 ENCOUNTER — Telehealth: Payer: Self-pay

## 2015-09-08 NOTE — Telephone Encounter (Signed)
Call to fup AWV; Agreed to reschedule for Thursday 07/13  at 1pm

## 2015-09-10 ENCOUNTER — Ambulatory Visit: Payer: Commercial Managed Care - HMO

## 2015-09-11 ENCOUNTER — Telehealth: Payer: Self-pay

## 2015-09-11 ENCOUNTER — Ambulatory Visit (INDEPENDENT_AMBULATORY_CARE_PROVIDER_SITE_OTHER): Payer: Commercial Managed Care - HMO

## 2015-09-11 VITALS — BP 140/80 | Ht 64.0 in | Wt 196.5 lb

## 2015-09-11 DIAGNOSIS — Z0000 Encounter for general adult medical examination without abnormal findings: Secondary | ICD-10-CM

## 2015-09-11 NOTE — Telephone Encounter (Signed)
Just FYI, the patient is refusing to take Crestor; STated she did not like the SE. Given information on low fat diet and cholesterol Tks,

## 2015-09-11 NOTE — Progress Notes (Addendum)
Subjective:   Megan Baldwin is a 82 y.o. female who presents for Medicare Annual (Subsequent) preventive examination.   HRA assessment completed during this visit with ms Padia  The Patient was informed that the wellness visit is to identify future health risk and educate and initiate measures that can reduce risk for increased disease through the lifespan.    NO ROS; Medicare Wellness Visit Last OV:  04/2015 Labs completed: 05/27/2015 / will discuss Hep C; A1c and Urine microalbumin with Dr. Jonny Ruiz at next blood draw. 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  Lipids cho 221; Trig 158; HDL 58 and LDL 131 LDL 131; Knows her LDL is 161 and chooses not to take the Crestor because she is fearful of the SE.   States her left leg pain; Childhood scar on bone and always has some concern it will get worse;  The patient also states she was just dx with a tear or her rotator cuff on the left. Is in acute pain today but states she can manage. She moved several times to try and get comfortable.  States she does not want surgery due to risk at 45.   Lifestyle review and risk: (family hx DM and breast cancer)  Hx of stroke; diabetes in 1st degree relative   Psychosocial: lives alone Dtr that lives in [place] and one [place];  [place] trip recently with dtr and enjoyed this Has a friend in hospital rehab; very sad  Mood is somber; but noted pain in left shoulder;   Tobacco: Former smoker;   How many drinks do you have per week? 0   Medications; compliant except she want take her pain meds  States she is not taking her crestor due to possible SE.  Educated on chol free diet;   BMI: 33   Diet; breakfast; cereal; oatmeal; alternate egg and bacon/ loves bread;   Lunch; largest meal; balanced;   Supper don't eat supper;   Teeth or Denture issues?   Exercise; Monday and Wednesdays; [facility]; Has always taken Yoga; [place] [facility]; Some people don't realize they are exercising with seniors;  Stopped and going to yoga; now stopped due to split in rotator cuff on left  Last year was going to increase exercise to 3 days a week    HOME SAFETY; one level;  Bathroom; Shower in tub; needs railing up Lives by herself; single family home with one floor; Has attended fall and balance prevention [facility] last year Falls: no Given education on "Fall Prevention in the Home" for more safety tips the patient can apply as appropriate.  Long term goal is to "age in place" or undecided  One neighbor that she corresponds   Safety features reviewed for safe community; firearms if in the home; smoke alarms;   Mental Health:  Any emotional problems? Anxious, depressed, irritable, sad or blue? no Denies feeling depressed or hopeless; voices pleasure in daily life How many social activities have you been engaged in within the last 2 weeks? None; States she does not socialize much; Denies depression. Difficult to evaluate today due to pain None; loves the movies;   Pain: hates to take meds for pain. Uses aspercreme;   Cognitive; a little bit; but no specific changes  Manages checkbook, medications; no failures of task Ad8 score reviewed for issues;  Issues making decisions; no  Less interest in hobbies /  activities" no  Repeats questions, stories; family complaining: NO  Trouble using ordinary gadgets; microwave; computer: no  Forgets the month or year: no   Mismanaging finances: no   Missing apt: no but does write them down  Daily problems with thinking of memory NO Ad8 score is 0  Dtr has sent her a sudoke puzzle; She does do mind games to keep herself stimulated.   Any dizziness when standing up? No dizziness   Mobilization and Functional losses from last year to this year? Tries not to use left arm  Sleep pattern changes; not sleeping; Watches tv all night; Monday's she stays in  bed;  Get up and eat and go back to bed;  Uses tea to help her sleep;    Urinary or fecal incontinence reviewed: goes to the bathroom a lot; eat watermelon;    Advanced Directive addressed; deferred but given paper work last year  Counseling Health Maintenance Gaps: Hep C; was exposed; so may discuss drawing with Dr. Jonny Ruiz A1c: (glucose 85) A1c due; states there are diabetics in family;  Foot exam: no issues at present noted; FBS neg for diabetes  Ua microalbumin: not done; can defer or put in labs for Dr. Jonny Ruiz to draw at next visit  Colonoscopy; 11/2008/ due 11/2018 but aged out unless issues  EKG: 01/2015 Mammogram: 12/18/2013/ left arm incapacitated and deferred discussion; aged out if she does not want to pursue  Dexa/ 04/2014 -1.7 (Calcium and Vit d ) 1200 calcium; taking 1000 u per day Taking Vit K;  PAP: educated regarding the need for GYN exam; aged out  Hearing: 4000hz ; no hearing loss in the last year    Ophthalmology exam: [facility]; will schedule eye exam   Immunizations Due: (Vaccines reviewed and educated regarding any overdue)  Zostavax: Dr. Jonny Ruiz was thinking about giving it to her but did not; Agreed to check on insurance coverage under part B / D  Individual Goal: go to [employer] for a computer course; Good to get out and open up some social media for her to communicate with the family   Health Recommendations and Referrals Risk for CV disease reviewed; including BP; Cholesterol; weight and exercise  Exercise on hold due to left Rotator cuff   Barriers to Success Left hip discomfort and left rotator cuff   Current Care Team reviewed and updated   Education provided and lifestyle risk discussed   All Health Maintenance Gaps Reviewed for closure    Cardiac Risk Factors include: advanced age (>68men, >64 women);dyslipidemia;family history of premature cardiovascular disease;hypertension;obesity (BMI >30kg/m2);sedentary lifestyle      Objective:     Vitals: BP 140/80 mmHg  Ht 5\' 4"  (1.626 m)  Wt 196 lb 8 oz (89.132 kg)  BMI 33.71 kg/m2  Body mass index is 33.71 kg/(m^2).   Tobacco History  Smoking status  . Former Smoker  Smokeless tobacco  . Not on file     Counseling given: Yes   Past Medical History  Diagnosis Date  . Hypertension   . Osteoporosis   . Anxiety   . Hyperlipidemia   . COPD (chronic obstructive pulmonary disease) (HCC)   . Allergic rhinitis   . Gallstones   . DJD (degenerative joint disease)     LS spine  . Disc disease, degenerative, cervical   . History of colonic polyps   . Diverticulosis of colon   . Aortic valve regurgitation 10/12/2010   Past Surgical History  Procedure Laterality Date  .  Abdominal hysterectomy    . Oophorectomy    . Colonoscopy w/ polypectomy     Family History  Problem Relation Age of Onset  . Stroke      F 1st degree relative  . Parkinsonism Father   . Cancer      1st degree relative <50  . Coronary artery disease      F 1st degree relative <60  . Diabetes      1st degree relative  . Hypertension    . Diabetes Mother   . Cancer Mother    History  Sexual Activity  . Sexual Activity: Not on file    Outpatient Encounter Prescriptions as of 09/11/2015  Medication Sig  . albuterol (PROVENTIL HFA;VENTOLIN HFA) 108 (90 Base) MCG/ACT inhaler Inhale 2 puffs into the lungs every 6 (six) hours as needed for wheezing or shortness of breath.  Marland Kitchen. aspirin 325 MG EC tablet Take 325 mg by mouth daily.    . diazepam (VALIUM) 5 MG tablet TAKE 1 TABLET BY MOUTH TWICE A DAY AS NEEDED  . diclofenac sodium (VOLTAREN) 1 % GEL Apply 1 application topically 4 (four) times daily.  . fexofenadine (ALLEGRA) 180 MG tablet Take 1 tablet (180 mg total) by mouth daily.  Marland Kitchen. labetalol (NORMODYNE) 200 MG tablet Take 1 tablet (200 mg total) by mouth 2 (two) times daily.  Marland Kitchen. spironolactone (ALDACTONE) 50 MG tablet Take 1 tablet (50 mg total) by mouth 2 (two) times daily.  .  rosuvastatin (CRESTOR) 10 MG tablet Take 1 tablet (10 mg total) by mouth daily. (Patient not taking: Reported on 09/11/2015)   No facility-administered encounter medications on file as of 09/11/2015.    Activities of Daily Living In your present state of health, do you have any difficulty performing the following activities: 09/11/2015  Hearing? N  Vision? N  Difficulty concentrating or making decisions? N  Walking or climbing stairs? Y  Dressing or bathing? N  Doing errands, shopping? N  Preparing Food and eating ? N  Using the Toilet? N  In the past six months, have you accidently leaked urine? (No Data)  Do you have problems with loss of bowel control? N  Managing your Medications? Y  Managing your Finances? N  Housekeeping or managing your Housekeeping? N    Patient Care Team: Corwin LevinsJames W John, MD as PCP - General    Assessment:   Spent time discussing pain in left shoulder. Did not want surgery; but discussed pros and cons of having it vs pros and cons of not having it. For now., she appears to be in acute pain.  States the surgery would be "simple" procedure Discussed her current limitations and ongoing pain if she does not have surgery vs finding out what type of pain is expected post op and how much rehab;. Agreed to call and discuss with Dr. Jonny RuizJohn if needed.   Also gave her information on cholesterol and best food choices  Exercise Activities and Dietary recommendations Current Exercise Habits: Home exercise routine;The patient does not participate in regular exercise at present, Exercise limited by: orthopedic condition(s)  Goals    . <enter goal here>    . Exercise 150 minutes per week (moderate activity)     Continue to build exercise program with weights but other strengthening activities as the you desire     . patient     To [facility] [employer] to take computer course       Fall Risk Fall Risk  09/11/2015 05/27/2015 08/22/2014 05/03/2014 05/03/2014  Falls in the past  year? No No No No No   Depression Screen PHQ 2/9 Scores 09/11/2015 05/27/2015 08/22/2014 05/03/2014  PHQ - 2 Score 0 1 0 1     Cognitive Testing MMSE - Mini Mental State Exam 08/22/2014  Not completed: Unable to complete    Immunization History  Administered Date(s) Administered  . Influenza Split 01/18/2011, 11/25/2011  . Influenza Whole 12/07/2007, 12/10/2008  . Influenza, High Dose Seasonal PF 01/03/2015  . Influenza,inj,Quad PF,36+ Mos 11/29/2012, 12/19/2013  . Pneumococcal Conjugate-13 12/21/2012  . Pneumococcal Polysaccharide-23 11/30/2006  . Td 03/01/1997, 08/09/2008   Screening Tests Health Maintenance  Topic Date Due  . HEMOGLOBIN A1C  12-02-1933  . FOOT EXAM  05/06/1945  . OPHTHALMOLOGY EXAM  05/06/1945  . URINE MICROALBUMIN  05/06/1945  . ZOSTAVAX  05/07/1995  . INFLUENZA VACCINE  09/30/2015  . TETANUS/TDAP  08/10/2018  . DEXA SCAN  Completed  . PNA vac Low Risk Adult  Completed      Plan:   Educated to check with insurance regarding coverage of Shingles vaccination on Part D or Part B and may have lower co-pay if provided on the Part D side   Will speak with Dr. Jonny Ruiz about taking a Hepatitis c  A1c and microalbumin at next lab draw; I will post pone these for now;   Is not taking her cholesterol med due to fear of SE;   Will consider rotator cuff surgery, depending on post op course and type of surgery. For now, she can't exercise which is not helping her overall health.  Will consider or discuss with Dr. Jonny Ruiz.   During the course of the visit the patient was educated and counseled about the following appropriate screening and preventive services:   Vaccines to include Pneumoccal, Influenza, Hepatitis B, Td, Zostavax, HCV  Electrocardiogram  Cardiovascular Disease  Colorectal cancer screening  Bone density screening  Diabetes screening  Glaucoma screening  Mammography/PAP  Nutrition counseling   Patient Instructions (the written plan) was  given to the patient.   Montine Circle, RN  09/11/2015  Medical screening examination/treatment/procedure(s) were performed by non-physician practitioner and as supervising physician I was immediately available for consultation/collaboration. I agree with above. Oliver Barre, MD

## 2015-09-11 NOTE — Patient Instructions (Addendum)
Ms. Gores , Thank you for taking time to come for your Medicare Wellness Visit. I appreciate your ongoing commitment to your health goals. Please review the following plan we discuout Bought this tape (dance) Will try 10 minutes; Yoga; Discussed exercise and "tensing up and instructed on vinyasa; yoga focused on relaxation Start with yoga breath workssed and let me know if I can assist you in the future.    Educated to check with insurance regarding coverage of Shingles vaccination on Part D or Part B and may have lower co-pay if provided on the Part D side   Will schedule your annual exam this year;   Will speak with Dr. Jenny Reichmann about taking a Hepatitis c  A1c and microalbumin; I will post pone these for now;   Is not taking her cholesterol med due to fear of SE    These are the goals we discussed: Goals    . <enter goal here>    . Exercise 150 minutes per week (moderate activity)     Continue to build exercise program with weights but other strengthening activities as the you desire     . patient     To go to [employer] to take computer course        This is a list of the screening recommended for you and due dates:  Health Maintenance  Topic Date Due  . Hemoglobin A1C  Sep 12, 1933  . Complete foot exam   05/06/1945  . Eye exam for diabetics  05/06/1945  . Urine Protein Check  05/06/1945  . Shingles Vaccine  05/07/1995  . Flu Shot  09/30/2015  . Tetanus Vaccine  08/10/2018  . DEXA scan (bone density measurement)  Completed  . Pneumonia vaccines  Completed   Cholesterol Cholesterol is a white, waxy, fat-like substance needed by your body in small amounts. The liver makes all the cholesterol you need. Cholesterol is carried from the liver by the blood through the blood vessels. Deposits of cholesterol (plaque) may build up on blood vessel walls. These make the arteries narrower and stiffer. Cholesterol plaques increase the risk for heart attack and stroke.  You cannot  feel your cholesterol level even if it is very high. The only way to know it is high is with a blood test. Once you know your cholesterol levels, you should keep a record of the test results. Work with your health care provider to keep your levels in the desired range.  WHAT DO THE RESULTS MEAN?  Total cholesterol is a rough measure of all the cholesterol in your blood.   LDL is the so-called bad cholesterol. This is the type that deposits cholesterol in the walls of the arteries. You want this level to be low.   HDL is the good cholesterol because it cleans the arteries and carries the LDL away. You want this level to be high.  Triglycerides are fat that the body can either burn for energy or store. High levels are closely linked to heart disease.  WHAT ARE THE DESIRED LEVELS OF CHOLESTEROL?  Total cholesterol below 200.   LDL below 100 for people at risk, below 70 for those at very high risk.   HDL above 50 is good, above 60 is best.   Triglycerides below 150.  HOW CAN I LOWER MY CHOLESTEROL?  Diet. Follow your diet programs as directed by your health care provider.   Choose fish or white meat chicken and [place], roasted or baked. Limit fatty cuts of  red meat, fried foods, and processed meats, such as sausage and lunch meats.   Eat lots of fresh fruits and vegetables.  Choose whole grains, beans, pasta, potatoes, and cereals.   Use only small amounts of olive, corn, or canola oils.   Avoid butter, mayonnaise, shortening, or palm kernel oils.  Avoid foods with trans fats.   Drink skim or nonfat milk and eat low-fat or nonfat yogurt and cheeses. Avoid whole milk, cream, ice cream, egg yolks, and full-fat cheeses.   Healthy desserts include angel food cake, ginger snaps, animal crackers, hard candy, popsicles, and low-fat or nonfat frozen yogurt. Avoid pastries, cakes, pies, and cookies.   Exercise. Follow your exercise programs as directed by your health care  provider.   A regular program helps decrease LDL and raise HDL.   A regular program helps with weight control.   Do things that increase your activity level like gardening, walking, or taking the stairs. Ask your health care provider about how you can be more active in your daily life.   Medicine. Take medicine only as directed by your health care provider.   Medicine may be prescribed by your health care provider to help lower cholesterol and decrease the risk for heart disease.   If you have several risk factors, you may need medicine even if your levels are normal.   This information is not intended to replace advice given to you by your health care provider. Make sure you discuss any questions you have with your health care provider.   Document Released: 11/10/2000 Document Revised: 03/08/2014 Document Reviewed: 11/29/2012 Elsevier Interactive Patient Education 2016 [employer]    Fat and Cholesterol Restricted Diet Getting too much fat and cholesterol in your diet may cause health problems. Following this diet helps keep your fat and cholesterol at normal levels. This can keep you from getting sick. WHAT TYPES OF FAT SHOULD I CHOOSE?  Choose monosaturated and polyunsaturated fats. These are found in foods such as olive oil, canola oil, flaxseeds, walnuts, almonds, and seeds.  Eat more omega-3 fats. Good choices include salmon, mackerel, sardines, tuna, flaxseed oil, and ground flaxseeds.  Limit saturated fats. These are in animal products such as meats, butter, and cream. They can also be in plant products such as palm oil, palm kernel oil, and coconut oil.   Avoid foods with partially hydrogenated oils in them. These contain trans fats. Examples of foods that have trans fats are stick margarine, some tub margarines, cookies, crackers, and other baked goods. WHAT GENERAL GUIDELINES DO I NEED TO FOLLOW?   Check food labels. Look for the words "trans fat" and "saturated  fat."  When preparing a meal:  Fill half of your plate with vegetables and green salads.  Fill one fourth of your plate with whole grains. Look for the word "whole" as the first word in the ingredient list.  Fill one fourth of your plate with lean protein foods.  Limit fruit to two servings a day. Choose fruit instead of juice.  Eat more foods with soluble fiber. Examples of foods with this type of fiber are apples, broccoli, carrots, beans, peas, and barley. Try to get 20-30 g (grams) of fiber per day.  Eat more home-cooked foods. Eat less at restaurants and buffets.  Limit or avoid alcohol.  Limit foods high in starch and sugar.  Limit fried foods.  Cook foods without frying them. Baking, boiling, grilling, and broiling are all great options.  Lose weight if you are  overweight. Losing even a small amount of weight can help your overall health. It can also help prevent diseases such as diabetes and heart disease. WHAT FOODS CAN I EAT? Grains Whole grains, such as whole wheat or whole grain breads, crackers, cereals, and pasta. Unsweetened oatmeal, bulgur, barley, quinoa, or brown rice. Corn or whole wheat flour tortillas. Vegetables Fresh or frozen vegetables (raw, steamed, roasted, or grilled). Green salads. Fruits All fresh, canned (in natural juice), or frozen fruits. Meat and Other Protein Products Ground beef (85% or leaner), grass-fed beef, or beef trimmed of fat. Skinless chicken or [place]. Ground chicken or [place]. Pork trimmed of fat. All fish and seafood. Eggs. Dried beans, peas, or lentils. Unsalted nuts or seeds. Unsalted canned or dry beans. Dairy Low-fat dairy products, such as skim or 1% milk, 2% or reduced-fat cheeses, low-fat ricotta or cottage cheese, or plain low-fat yogurt. Fats and Oils Tub margarines without trans fats. Light or reduced-fat mayonnaise and salad dressings. Avocado. Olive, canola, sesame, or safflower oils. Natural peanut or almond butter  (choose ones without added sugar and oil). The items listed above may not be a complete list of recommended foods or beverages. Contact your dietitian for more options. WHAT FOODS ARE NOT RECOMMENDED? Grains White bread. White pasta. White rice. Cornbread. Bagels, pastries, and croissants. Crackers that contain trans fat. Vegetables White potatoes. Corn. Creamed or fried vegetables. Vegetables in a cheese sauce. Fruits Dried fruits. Canned fruit in light or heavy syrup. Fruit juice. Meat and Other Protein Products Fatty cuts of meat. Ribs, chicken wings, bacon, sausage, bologna, salami, chitterlings, fatback, hot dogs, bratwurst, and packaged luncheon meats. Liver and organ meats. Dairy Whole or 2% milk, cream, half-and-half, and cream cheese. Whole milk cheeses. Whole-fat or sweetened yogurt. Full-fat cheeses. Nondairy creamers and whipped toppings. Processed cheese, cheese spreads, or cheese curds. Sweets and Desserts Corn syrup, sugars, honey, and molasses. Candy. Jam and jelly. Syrup. Sweetened cereals. Cookies, pies, cakes, donuts, muffins, and ice cream. Fats and Oils Butter, stick margarine, lard, shortening, ghee, or bacon fat. Coconut, palm kernel, or palm oils. Beverages Alcohol. Sweetened drinks (such as sodas, lemonade, and fruit drinks or punches). The items listed above may not be a complete list of foods and beverages to avoid. Contact your dietitian for more information.   This information is not intended to replace advice given to you by your health care provider. Make sure you discuss any questions you have with your health care provider.   Document Released: 08/17/2011 Document Revised: 03/08/2014 Document Reviewed: 05/17/2013 Elsevier Interactive Patient Education 2016 [place] in the Home  Falls can cause injuries. They can happen to people of all ages. There are many things you can do to make your home safe and to help prevent falls.  WHAT CAN  I DO ON THE OUTSIDE OF MY HOME?  Regularly fix the edges of walkways and driveways and fix any cracks.  Remove anything that might make you trip as you walk through a door, such as a raised step or threshold.  Trim any bushes or trees on the path to your home.  Use bright outdoor lighting.  Clear any walking paths of anything that might make someone trip, such as rocks or tools.  Regularly check to see if handrails are loose or broken. Make sure that both sides of any steps have handrails.  Any raised decks and porches should have guardrails on the edges.  Have any leaves, snow, or ice cleared regularly.  Use sand or salt on walking paths during winter.  Clean up any spills in your garage right away. This includes oil or grease spills. WHAT CAN I DO IN THE BATHROOM?   Use night lights.  Install grab bars by the toilet and in the tub and shower. Do not use towel bars as grab bars.  Use non-skid mats or decals in the tub or shower.  If you need to sit down in the shower, use a plastic, non-slip stool.  Keep the floor dry. Clean up any water that spills on the floor as soon as it happens.  Remove soap buildup in the tub or shower regularly.  Attach bath mats securely with double-sided non-slip rug tape.  Do not have throw rugs and other things on the floor that can make you trip. WHAT CAN I DO IN THE BEDROOM?  Use night lights.  Make sure that you have a light by your bed that is easy to reach.  Do not use any sheets or blankets that are too big for your bed. They should not hang down onto the floor.  Have a firm chair that has side arms. You can use this for support while you get dressed.  Do not have throw rugs and other things on the floor that can make you trip. WHAT CAN I DO IN THE KITCHEN?  Clean up any spills right away.  Avoid walking on wet floors.  Keep items that you use a lot in easy-to-reach places.  If you need to reach something above you, use a  strong step stool that has a grab bar.  Keep electrical cords out of the way.  Do not use floor polish or wax that makes floors slippery. If you must use wax, use non-skid floor wax.  Do not have throw rugs and other things on the floor that can make you trip. WHAT CAN I DO WITH MY STAIRS?  Do not leave any items on the stairs.  Make sure that there are handrails on both sides of the stairs and use them. Fix handrails that are broken or loose. Make sure that handrails are as long as the stairways.  Check any carpeting to make sure that it is firmly attached to the stairs. Fix any carpet that is loose or worn.  Avoid having throw rugs at the top or bottom of the stairs. If you do have throw rugs, attach them to the floor with carpet tape.  Make sure that you have a light switch at the top of the stairs and the bottom of the stairs. If you do not have them, ask someone to add them for you. WHAT ELSE CAN I DO TO HELP PREVENT FALLS?  Wear shoes that:  Do not have high heels.  Have rubber bottoms.  Are comfortable and fit you well.  Are closed at the toe. Do not wear sandals.  If you use a stepladder:  Make sure that it is fully opened. Do not climb a closed stepladder.  Make sure that both sides of the stepladder are locked into place.  Ask someone to hold it for you, if possible.  Clearly mark and make sure that you can see:  Any grab bars or handrails.  First and last steps.  Where the edge of each step is.  Use tools that help you move around (mobility aids) if they are needed. These include:  Canes.  Walkers.  Scooters.  Crutches.  Turn on the lights when you go  into a dark area. Replace any light bulbs as soon as they burn out.  Set up your furniture so you have a clear path. Avoid moving your furniture around.  If any of your floors are uneven, fix them.  If there are any pets around you, be aware of where they are.  Review your medicines with your  doctor. Some medicines can make you feel dizzy. This can increase your chance of falling. Ask your doctor what other things that you can do to help prevent falls.   This information is not intended to replace advice given to you by your health care provider. Make sure you discuss any questions you have with your health care provider.   Document Released: 12/12/2008 Document Revised: 07/02/2014 Document Reviewed: 03/22/2014 Elsevier Interactive Patient Education 2016 [place] Maintenance, Female Adopting a healthy lifestyle and getting preventive care can go a long way to promote health and wellness. Talk with your health care provider about what schedule of regular examinations is right for you. This is a good chance for you to check in with your provider about disease prevention and staying healthy. In between checkups, there are plenty of things you can do on your own. Experts have done a lot of research about which lifestyle changes and preventive measures are most likely to keep you healthy. Ask your health care provider for more information. WEIGHT AND DIET  Eat a healthy diet  Be sure to include plenty of vegetables, fruits, low-fat dairy products, and lean protein.  Do not eat a lot of foods high in solid fats, added sugars, or salt.  Get regular exercise. This is one of the most important things you can do for your health.  Most adults should exercise for at least 150 minutes each week. The exercise should increase your heart rate and make you sweat (moderate-intensity exercise).  Most adults should also do strengthening exercises at least twice a week. This is in addition to the moderate-intensity exercise.  Maintain a healthy weight  Body mass index (BMI) is a measurement that can be used to identify possible weight problems. It estimates body fat based on height and weight. Your health care provider can help determine your BMI and help you achieve or maintain a  healthy weight.  For females 27 years of age and older:   A BMI below 18.5 is considered underweight.  A BMI of 18.5 to 24.9 is normal.  A BMI of 25 to 29.9 is considered overweight.  A BMI of 30 and above is considered obese.  Watch levels of cholesterol and blood lipids  You should start having your blood tested for lipids and cholesterol at 80 years of age, then have this test every 5 years.  You may need to have your cholesterol levels checked more often if:  Your lipid or cholesterol levels are high.  You are older than 80 years of age.  You are at high risk for heart disease.  CANCER SCREENING   Lung Cancer  Lung cancer screening is recommended for adults 72-76 years old who are at high risk for lung cancer because of a history of smoking.  A yearly low-dose CT scan of the lungs is recommended for people who:  Currently smoke.  Have quit within the past 15 years.  Have at least a 30-pack-year history of smoking. A pack year is smoking an average of one pack of cigarettes a day for 1 year.  Yearly screening should continue until it  has been 15 years since you quit.  Yearly screening should stop if you develop a health problem that would prevent you from having lung cancer treatment.  Breast Cancer  Practice breast self-awareness. This means understanding how your breasts normally appear and feel.  It also means doing regular breast self-exams. Let your health care provider know about any changes, no matter how small.  If you are in your 20s or 30s, you should have a clinical breast exam (CBE) by a health care provider every 1-3 years as part of a regular health exam.  If you are 55 or older, have a CBE every year. Also consider having a breast X-ray (mammogram) every year.  If you have a family history of breast cancer, talk to your health care provider about genetic screening.  If you are at high risk for breast cancer, talk to your health care provider  about having an MRI and a mammogram every year.  Breast cancer gene (BRCA) assessment is recommended for women who have family members with BRCA-related cancers. BRCA-related cancers include:  Breast.  Ovarian.  Tubal.  Peritoneal cancers.  Results of the assessment will determine the need for genetic counseling and BRCA1 and BRCA2 testing. Cervical Cancer Your health care provider may recommend that you be screened regularly for cancer of the pelvic organs (ovaries, uterus, and vagina). This screening involves a pelvic examination, including checking for microscopic changes to the surface of your cervix (Pap test). You may be encouraged to have this screening done every 3 years, beginning at age 1.  For women ages 79-65, health care providers may recommend pelvic exams and Pap testing every 3 years, or they may recommend the Pap and pelvic exam, combined with testing for human papilloma virus (HPV), every 5 years. Some types of HPV increase your risk of cervical cancer. Testing for HPV may also be done on women of any age with unclear Pap test results.  Other health care providers may not recommend any screening for nonpregnant women who are considered low risk for pelvic cancer and who do not have symptoms. Ask your health care provider if a screening pelvic exam is right for you.  If you have had past treatment for cervical cancer or a condition that could lead to cancer, you need Pap tests and screening for cancer for at least 20 years after your treatment. If Pap tests have been discontinued, your risk factors (such as having a new sexual partner) need to be reassessed to determine if screening should resume. Some women have medical problems that increase the chance of getting cervical cancer. In these cases, your health care provider may recommend more frequent screening and Pap tests. Colorectal Cancer  This type of cancer can be detected and often prevented.  Routine colorectal  cancer screening usually begins at 80 years of age and continues through 80 years of age.  Your health care provider may recommend screening at an earlier age if you have risk factors for colon cancer.  Your health care provider may also recommend using home test kits to check for hidden blood in the stool.  A small camera at the end of a tube can be used to examine your colon directly (sigmoidoscopy or colonoscopy). This is done to check for the earliest forms of colorectal cancer.  Routine screening usually begins at age 11.  Direct examination of the colon should be repeated every 5-10 years through 80 years of age. However, you may need to be screened more  often if early forms of precancerous polyps or small growths are found. Skin Cancer  Check your skin from head to toe regularly.  Tell your health care provider about any new moles or changes in moles, especially if there is a change in a mole's shape or color.  Also tell your health care provider if you have a mole that is larger than the size of a pencil eraser.  Always use sunscreen. Apply sunscreen liberally and repeatedly throughout the day.  Protect yourself by wearing long sleeves, pants, a wide-brimmed hat, and sunglasses whenever you are outside. HEART DISEASE, DIABETES, AND HIGH BLOOD PRESSURE   High blood pressure causes heart disease and increases the risk of stroke. High blood pressure is more likely to develop in:  People who have blood pressure in the high end of the normal range (130-139/85-89 mm Hg).  People who are overweight or obese.  People who are African American.  If you are 65-25 years of age, have your blood pressure checked every 3-5 years. If you are 80 years of age or older, have your blood pressure checked every year. You should have your blood pressure measured twice--once when you are at a hospital or clinic, and once when you are not at a hospital or clinic. Record the average of the two  measurements. To check your blood pressure when you are not at a hospital or clinic, you can use:  An automated blood pressure machine at a pharmacy.  A home blood pressure monitor.  If you are between 54 years and 102 years old, ask your health care provider if you should take aspirin to prevent strokes.  Have regular diabetes screenings. This involves taking a blood sample to check your fasting blood sugar level.  If you are at a normal weight and have a low risk for diabetes, have this test once every three years after 80 years of age.  If you are overweight and have a high risk for diabetes, consider being tested at a younger age or more often. PREVENTING INFECTION  Hepatitis B  If you have a higher risk for hepatitis B, you should be screened for this virus. You are considered at high risk for hepatitis B if:  You were born in a country where hepatitis B is common. Ask your health care provider which countries are considered high risk.  Your parents were born in a high-risk country, and you have not been immunized against hepatitis B (hepatitis B vaccine).  You have HIV or AIDS.  You use needles to inject street drugs.  You live with someone who has hepatitis B.  You have had sex with someone who has hepatitis B.  You get hemodialysis treatment.  You take certain medicines for conditions, including cancer, organ transplantation, and autoimmune conditions. Hepatitis C  Blood testing is recommended for:  Everyone born from [birth year] through 1963.  Anyone with known risk factors for hepatitis C. Sexually transmitted infections (STIs)  You should be screened for sexually transmitted infections (STIs) including gonorrhea and chlamydia if:  You are sexually active and are younger than 80 years of age.  You are older than 80 years of age and your health care provider tells you that you are at risk for this type of infection.  Your sexual activity has changed since you were  last screened and you are at an increased risk for chlamydia or gonorrhea. Ask your health care provider if you are at risk.  If you do not have  HIV, but are at risk, it may be recommended that you take a prescription medicine daily to prevent HIV infection. This is called pre-exposure prophylaxis (PrEP). You are considered at risk if:  You are sexually active and do not regularly use condoms or know the HIV status of your partner(s).  You take drugs by injection.  You are sexually active with a partner who has HIV. Talk with your health care provider about whether you are at high risk of being infected with HIV. If you choose to begin PrEP, you should first be tested for HIV. You should then be tested every 3 months for as long as you are taking PrEP.  PREGNANCY   If you are premenopausal and you may become pregnant, ask your health care provider about preconception counseling.  If you may become pregnant, take 400 to 800 micrograms (mcg) of folic acid every day.  If you want to prevent pregnancy, talk to your health care provider about birth control (contraception). OSTEOPOROSIS AND MENOPAUSE   Osteoporosis is a disease in which the bones lose minerals and strength with aging. This can result in serious bone fractures. Your risk for osteoporosis can be identified using a bone density scan.  If you are 61 years of age or older, or if you are at risk for osteoporosis and fractures, ask your health care provider if you should be screened.  Ask your health care provider whether you should take a calcium or vitamin D supplement to lower your risk for osteoporosis.  Menopause may have certain physical symptoms and risks.  Hormone replacement therapy may reduce some of these symptoms and risks. Talk to your health care provider about whether hormone replacement therapy is right for you.  HOME CARE INSTRUCTIONS   Schedule regular health, dental, and eye exams.  Stay current with your  immunizations.   Do not use any tobacco products including cigarettes, chewing tobacco, or electronic cigarettes.  If you are pregnant, do not drink alcohol.  If you are breastfeeding, limit how much and how often you drink alcohol.  Limit alcohol intake to no more than 1 drink per day for nonpregnant women. One drink equals 12 ounces of beer, 5 ounces of wine, or 1 ounces of hard liquor.  Do not use street drugs.  Do not share needles.  Ask your health care provider for help if you need support or information about quitting drugs.  Tell your health care provider if you often feel depressed.  Tell your health care provider if you have ever been abused or do not feel safe at home.   This information is not intended to replace advice given to you by your health care provider. Make sure you discuss any questions you have with your health care provider.   Document Released: 08/31/2010 Document Revised: 03/08/2014 Document Reviewed: 01/17/2013 Elsevier Interactive Patient Education [employer].

## 2015-09-16 NOTE — Telephone Encounter (Signed)
Call to Ms. Eckart to fup on someone to put up handrails in the bathroom; Given the name of [employer] at [phone]  Ms. Jerral RalphBanfield will call to assess any help that may be available to her.

## 2015-10-22 ENCOUNTER — Other Ambulatory Visit: Payer: Self-pay | Admitting: Internal Medicine

## 2015-12-04 ENCOUNTER — Ambulatory Visit (INDEPENDENT_AMBULATORY_CARE_PROVIDER_SITE_OTHER): Payer: Commercial Managed Care - HMO

## 2015-12-04 DIAGNOSIS — Z23 Encounter for immunization: Secondary | ICD-10-CM | POA: Diagnosis not present

## 2015-12-30 ENCOUNTER — Other Ambulatory Visit: Payer: Self-pay | Admitting: Internal Medicine

## 2015-12-30 DIAGNOSIS — Z1231 Encounter for screening mammogram for malignant neoplasm of breast: Secondary | ICD-10-CM

## 2016-01-20 ENCOUNTER — Ambulatory Visit
Admission: RE | Admit: 2016-01-20 | Discharge: 2016-01-20 | Disposition: A | Discharge status: Home / Self Care | Payer: Commercial Managed Care - HMO | Source: Ambulatory Visit | Attending: Internal Medicine | Admitting: Internal Medicine

## 2016-01-20 DIAGNOSIS — Z1231 Encounter for screening mammogram for malignant neoplasm of breast: Secondary | ICD-10-CM | POA: Diagnosis not present

## 2016-02-02 DIAGNOSIS — H25812 Combined forms of age-related cataract, left eye: Secondary | ICD-10-CM | POA: Diagnosis not present

## 2016-02-02 DIAGNOSIS — H35033 Hypertensive retinopathy, bilateral: Secondary | ICD-10-CM | POA: Diagnosis not present

## 2016-02-10 ENCOUNTER — Ambulatory Visit (INDEPENDENT_AMBULATORY_CARE_PROVIDER_SITE_OTHER): Payer: Commercial Managed Care - HMO | Admitting: Podiatry

## 2016-02-10 ENCOUNTER — Encounter: Payer: Self-pay | Admitting: Podiatry

## 2016-02-10 VITALS — BP 139/92 | HR 72 | Resp 18

## 2016-02-10 DIAGNOSIS — M79675 Pain in left toe(s): Secondary | ICD-10-CM

## 2016-02-10 DIAGNOSIS — B351 Tinea unguium: Secondary | ICD-10-CM | POA: Diagnosis not present

## 2016-02-10 DIAGNOSIS — M79674 Pain in right toe(s): Secondary | ICD-10-CM

## 2016-02-10 NOTE — Patient Instructions (Signed)

## 2016-02-10 NOTE — Progress Notes (Signed)
Patient ID: Devra Doppvelyn Bertha, female   DOB: 12/16/1933, 82 y.o.   MRN: [id number]   Subjective: This patient presents today complaining of uncomfortable and elongated toenails when walking wearing shoes and requests toenail debridement. She Was last evaluated for a similar problem on 01/22/2015   Physical Exam  Orientated 3 patient presents with her daughter was in the treatment room  Vascular: No peripheral edema noted bilaterally DP pulses 2/4 bilaterally PT pulses 2/4 bilaterally Capillary reflex immediate bilaterally  Neurological: Sensation to 10 g monofilament wire intact 5/5 bilaterally Vibratory sensation intact bilaterally Ankle reflex equal and reactive bilaterally  Dermatological: No open skin lesions bilaterally The hallux toenails are hypertrophic discolored and incurvated on the medial lateral borders. There is no surrounding erythema, edema or drainage around the hallux nails No skin lesions bilaterally  Musculoskeletal: No deformities bilaterally No restriction ankle, subtalar, midtarsal joints bilaterally   Assessment: Satisfactory neurovascular status Symptomatic mycotic toenails 2  Plan: Today review the results of examination with patient today and offered her nail debridement and she verbally consents. The hallux toenails were debrided mechanically and electrically without any bleeding  Reappoint at patient's request

## 2016-04-18 ENCOUNTER — Other Ambulatory Visit: Payer: Self-pay | Admitting: Internal Medicine

## 2016-05-05 ENCOUNTER — Other Ambulatory Visit: Payer: Self-pay | Admitting: Internal Medicine

## 2016-05-05 NOTE — Telephone Encounter (Signed)
Faxed script back to [facility].../lmb 

## 2016-05-05 NOTE — Telephone Encounter (Signed)
Done hardcopy to Anna  

## 2016-06-21 IMAGING — CR DG TIBIA/FIBULA 2V*L*
4 series · 4 of 4 positions shown · non-contrast
Comparison: None.

CLINICAL DATA: One year history of pain

EXAM:
LEFT TIBIA AND FIBULA - 2 VIEW

[view not recorded (1 of 4)]
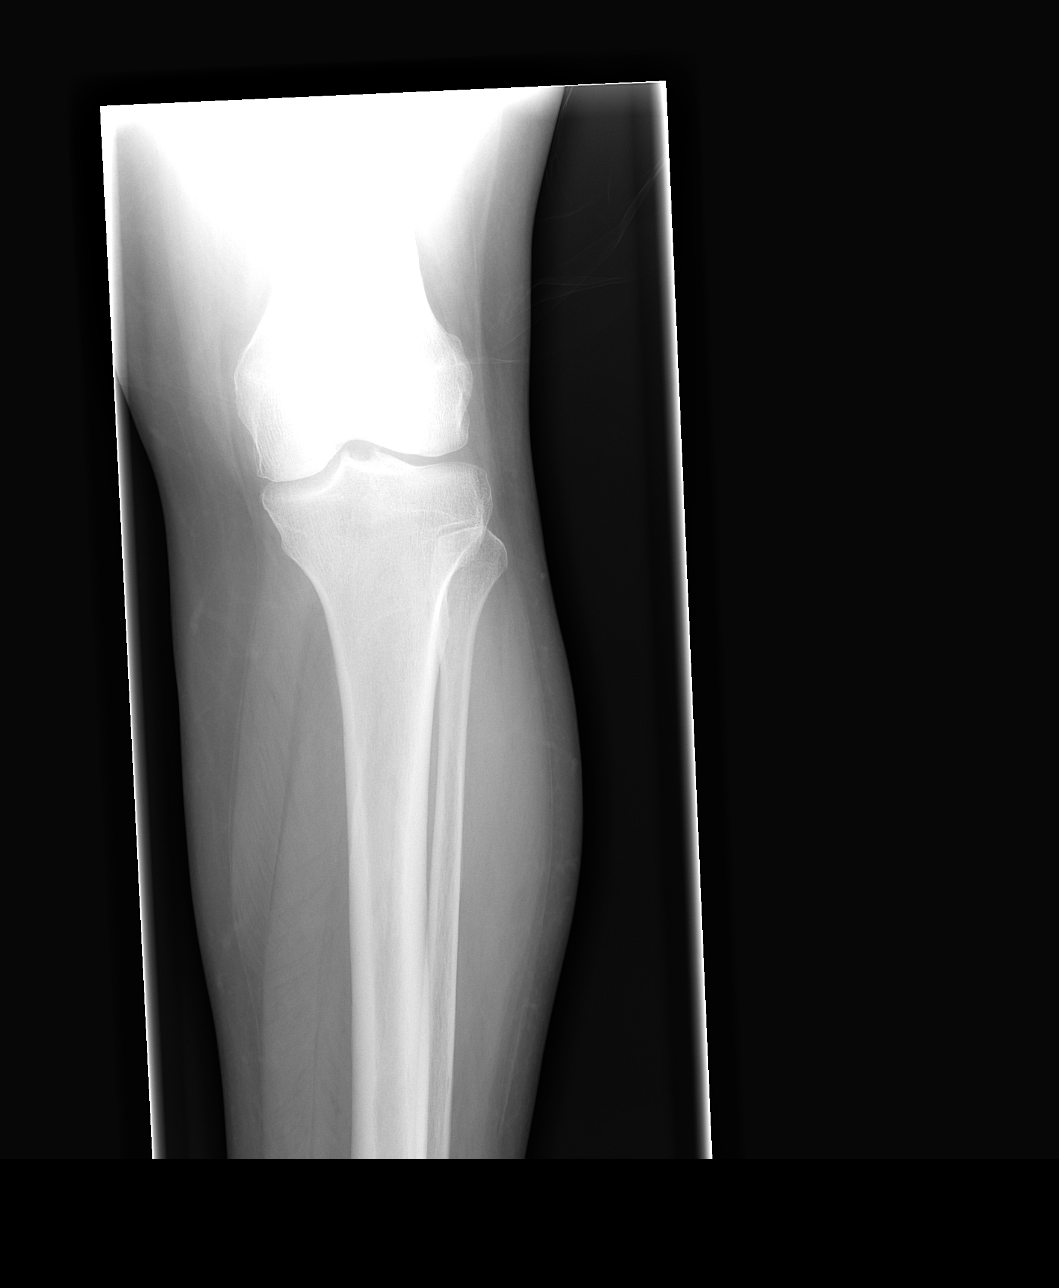

[view not recorded (2 of 4)]
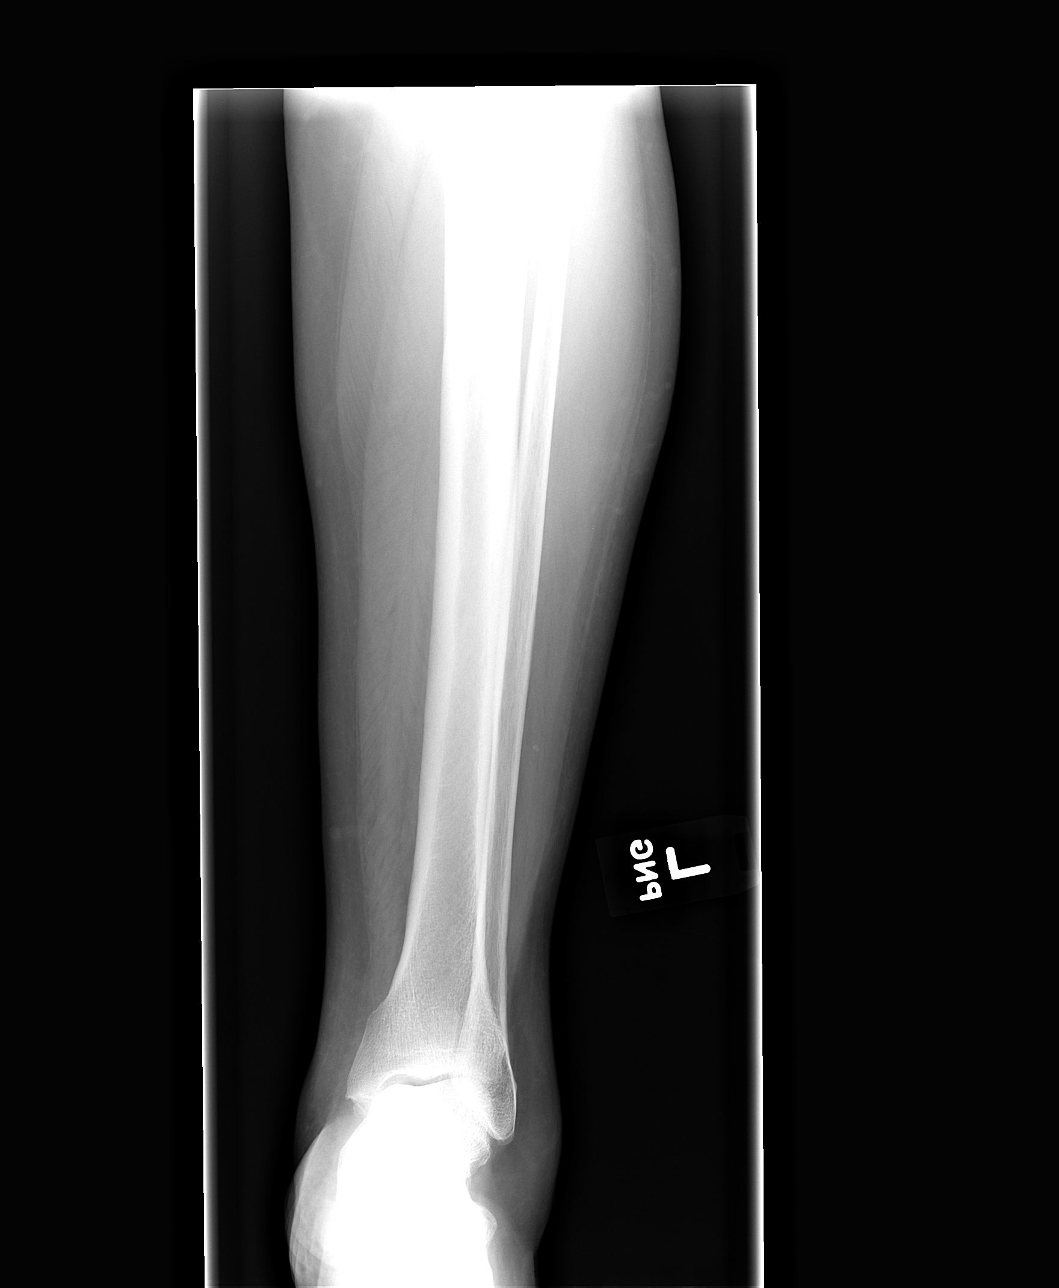

[view not recorded (3 of 4)]
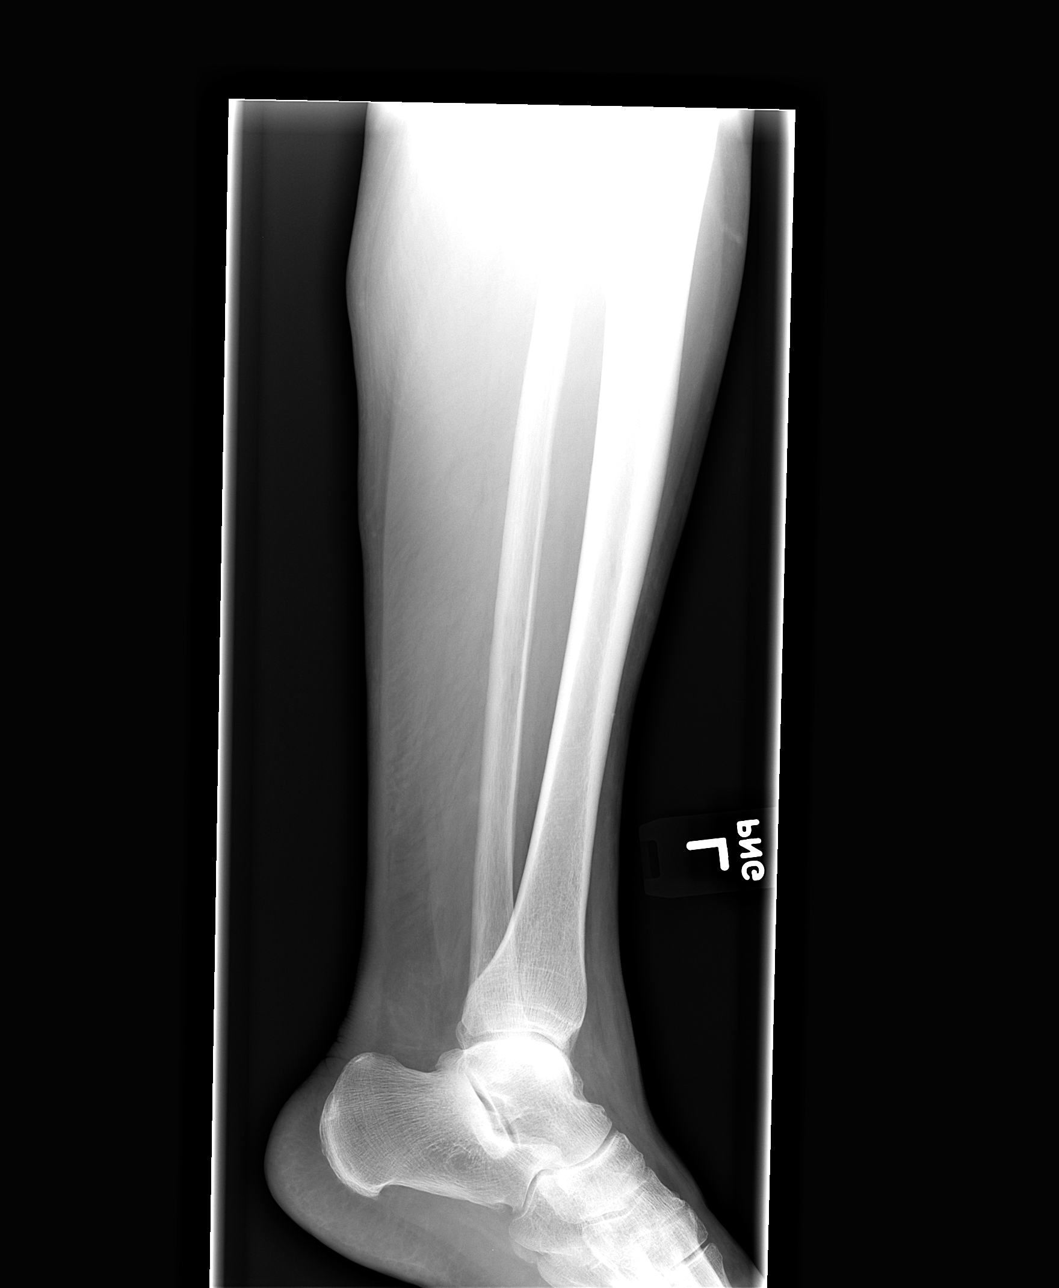

[view not recorded (4 of 4)]
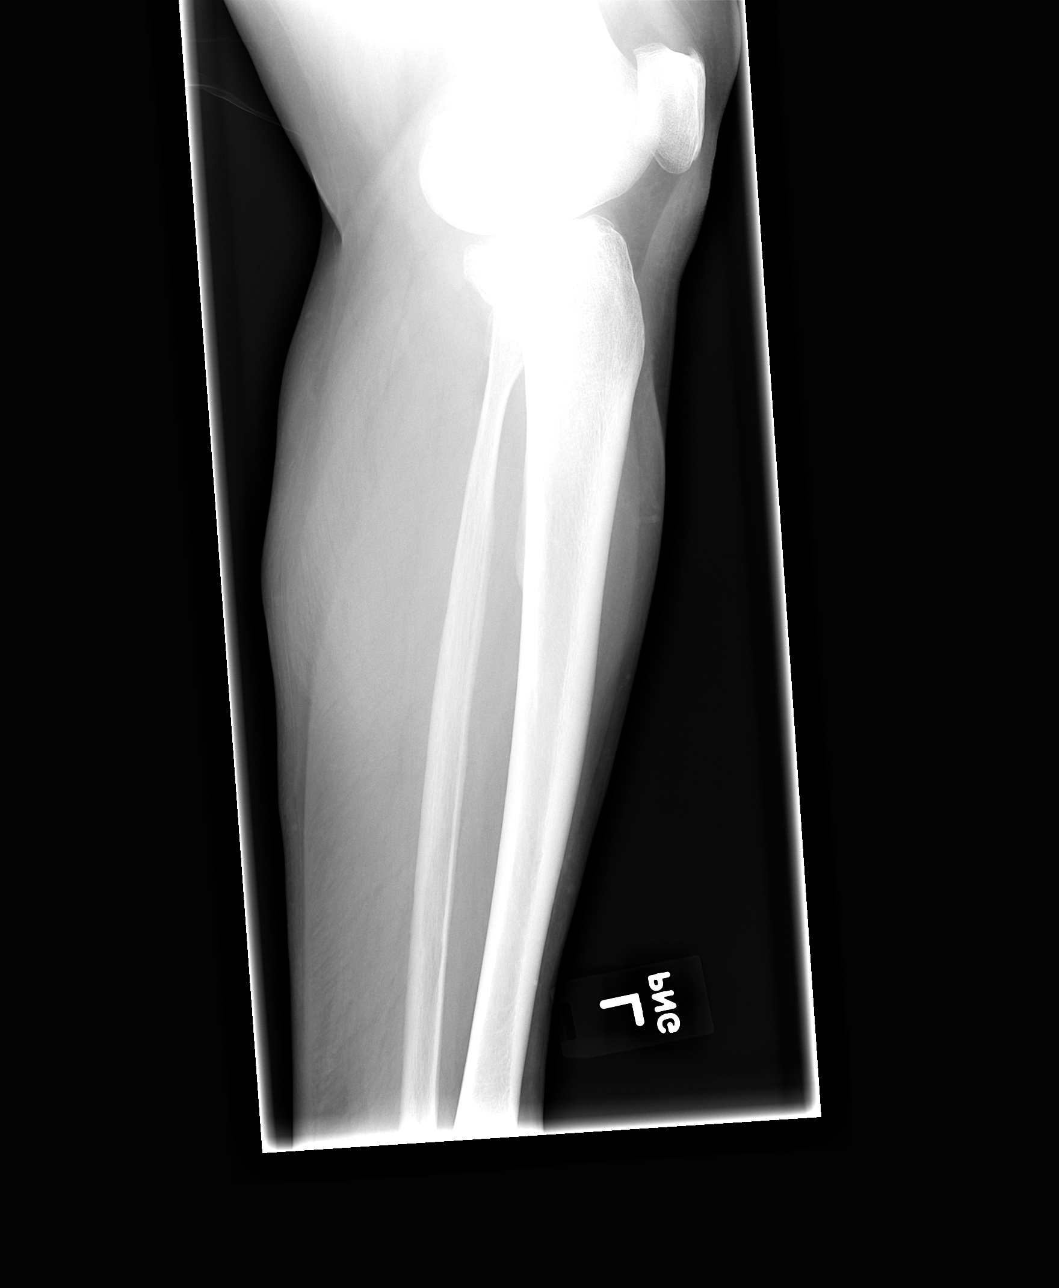

[4 of 4 positions shown; findings below may reference images not displayed]

FINDINGS: Frontal and lateral views were obtained. There is no fracture or
dislocation. Joint spaces appear intact. No appreciable abnormal
periosteal reaction.
IMPRESSION: No fracture or dislocation.  No appreciable arthropathic change.

## 2016-07-20 ENCOUNTER — Other Ambulatory Visit: Payer: Self-pay | Admitting: Internal Medicine

## 2016-08-10 ENCOUNTER — Other Ambulatory Visit: Payer: Self-pay | Admitting: Internal Medicine

## 2016-08-11 ENCOUNTER — Other Ambulatory Visit: Payer: Self-pay | Admitting: *Deleted

## 2016-08-11 MED ORDER — LABETALOL HCL 200 MG PO TABS: 200.0000 mg | ORAL_TABLET | Freq: Two times a day (BID) | ORAL | 0 refills | Status: DC

## 2016-08-11 MED ORDER — SPIRONOLACTONE 50 MG PO TABS: 50.0000 mg | ORAL_TABLET | Freq: Two times a day (BID) | ORAL | 0 refills | Status: DC

## 2016-08-11 MED ORDER — ROSUVASTATIN CALCIUM 10 MG PO TABS: 10.0000 mg | ORAL_TABLET | Freq: Every day | ORAL | 0 refills | Status: DC

## 2016-08-11 NOTE — Telephone Encounter (Signed)
Ok to let pt know, we can not refill her valium any longer as her last OV was more then 15 mo ago

## 2016-08-11 NOTE — Telephone Encounter (Signed)
Rec'd call pt is wanting to know why her med refills was denied. Inform pt she is overdue for annual appt last saw MD 04/2015, and will need OV for renewals. Pt states she can't come in until mid July. Made appt for 09/15/16 @ 10:15, inform pt [facility]. Sent maintenance meds already pls advise on Diazepam.../lmb

## 2016-09-10 ENCOUNTER — Other Ambulatory Visit: Payer: Self-pay | Admitting: Internal Medicine

## 2016-09-15 ENCOUNTER — Encounter: Payer: Self-pay | Admitting: Internal Medicine

## 2016-09-15 ENCOUNTER — Other Ambulatory Visit (INDEPENDENT_AMBULATORY_CARE_PROVIDER_SITE_OTHER): Payer: Medicare HMO

## 2016-09-15 ENCOUNTER — Ambulatory Visit (INDEPENDENT_AMBULATORY_CARE_PROVIDER_SITE_OTHER): Payer: Medicare HMO | Admitting: Internal Medicine

## 2016-09-15 ENCOUNTER — Telehealth: Payer: Self-pay

## 2016-09-15 VITALS — BP 114/82 | HR 89 | Ht 64.0 in | Wt 200.0 lb

## 2016-09-15 DIAGNOSIS — Z0000 Encounter for general adult medical examination without abnormal findings: Secondary | ICD-10-CM

## 2016-09-15 DIAGNOSIS — I10 Essential (primary) hypertension: Secondary | ICD-10-CM

## 2016-09-15 LAB — BASIC METABOLIC PANEL
BUN: 21 mg/dL (ref 6–23)
CO2: 24 meq/L (ref 19–32)
Calcium: 10.2 mg/dL (ref 8.4–10.5)
Chloride: 106 mEq/L (ref 96–112)
Creatinine, Ser: 1.21 mg/dL — ABNORMAL HIGH (ref 0.40–1.20)
GFR: 54.87 mL/min — AB (ref >60.00)
GLUCOSE: 113 mg/dL — AB (ref 70–99)
POTASSIUM: 4.2 meq/L (ref 3.5–5.1)
SODIUM: 138 meq/L (ref 135–145)

## 2016-09-15 LAB — URINALYSIS, ROUTINE W REFLEX MICROSCOPIC
BILIRUBIN URINE: NEGATIVE
Hgb urine dipstick: NEGATIVE
Ketones, ur: NEGATIVE
Nitrite: NEGATIVE
PH: 6 (ref 5.0–8.0)
SPECIFIC GRAVITY, URINE: 1.025 (ref 1.000–1.030)
Total Protein, Urine: NEGATIVE
Urine Glucose: NEGATIVE
Urobilinogen, UA: 0.2 (ref 0.0–1.0)

## 2016-09-15 LAB — CBC WITH DIFFERENTIAL/PLATELET
BASOS ABS: 0 10*3/uL (ref 0.0–0.1)
Basophils Relative: 0.7 % (ref 0.0–3.0)
EOS ABS: 0.2 10*3/uL (ref 0.0–0.7)
Eosinophils Relative: 5.2 % — ABNORMAL HIGH (ref 0.0–5.0)
HCT: 40.6 % (ref 36.0–46.0)
Hemoglobin: 13.7 g/dL (ref 12.0–15.0)
LYMPHS ABS: 1.6 10*3/uL (ref 0.7–4.0)
LYMPHS PCT: 34.6 % (ref 12.0–46.0)
MCHC: 33.8 g/dL (ref 30.0–36.0)
MCV: 88.1 fl (ref 78.0–100.0)
MONO ABS: 0.6 10*3/uL (ref 0.1–1.0)
Monocytes Relative: 13.5 % — ABNORMAL HIGH (ref 3.0–12.0)
NEUTROS ABS: 2.2 10*3/uL (ref 1.4–7.7)
NEUTROS PCT: 46 % (ref 43.0–77.0)
PLATELETS: 153 10*3/uL (ref 150.0–400.0)
RBC: 4.61 Mil/uL (ref 3.87–5.11)
RDW: 14.2 % (ref 11.5–15.5)
WBC: 4.7 10*3/uL (ref 4.0–10.5)

## 2016-09-15 LAB — LIPID PANEL
CHOL/HDL RATIO: 3
Cholesterol: 181 mg/dL (ref 0–200)
HDL: 62.4 mg/dL (ref >39.00)
LDL Cholesterol: 100 mg/dL — ABNORMAL HIGH (ref 0–99)
NONHDL: 118.19
Triglycerides: 89 mg/dL (ref 0.0–149.0)
VLDL: 17.8 mg/dL (ref 0.0–40.0)

## 2016-09-15 LAB — HEPATIC FUNCTION PANEL
ALK PHOS: 55 U/L (ref 39–117)
ALT: 19 U/L (ref 0–35)
AST: 21 U/L (ref 0–37)
Albumin: 4.4 g/dL (ref 3.5–5.2)
BILIRUBIN DIRECT: 0.1 mg/dL (ref 0.0–0.3)
BILIRUBIN TOTAL: 0.6 mg/dL (ref 0.2–1.2)
Total Protein: 8 g/dL (ref 6.0–8.3)

## 2016-09-15 LAB — TSH: TSH: 0.72 u[IU]/mL (ref 0.35–4.50)

## 2016-09-15 NOTE — Telephone Encounter (Signed)
Pt was in the lab and needed orders put in for future appt.

## 2016-09-15 NOTE — Patient Instructions (Signed)
Your EKG was OK today  Please continue all other medications as before, and refills have been done if requested.  Please have the pharmacy call with any other refills you may need.  Please continue your efforts at being more active, low cholesterol diet, and weight control.  You are otherwise up to date with prevention measures today.  Please keep your appointments with your specialists as you may have planned  Your labs were drawn this AM  You will be contacted by phone if any changes need to be made immediately.  Otherwise, you will receive a letter about your results with an explanation, but please check with MyChart first.  Please remember to sign up for MyChart if you have not done so, as this will be important to you in the future with finding out test results, communicating by private email, and scheduling acute appointments online when needed.  Please return in 1 year for your yearly visit, or sooner if needed, with Lab testing done 3-5 days before

## 2016-09-15 NOTE — Progress Notes (Signed)
Subjective:    Patient ID: Megan Baldwin, female    DOB: 03/23/1933, 83 y.o.   MRN: [id number]  HPI  Here for wellness and f/u;  Overall doing ok;  Pt denies Chest pain, worsening SOB, DOE, wheezing, orthopnea, PND, worsening LE edema, palpitations, dizziness or syncope.  Pt denies neurological change such as new headache, facial or extremity weakness.  Pt denies polydipsia, polyuria, or low sugar symptoms. Pt states overall good compliance with treatment and medications, good tolerability, and has been trying to follow appropriate diet.  Pt denies worsening depressive symptoms, suicidal ideation or panic. No fever, night sweats, wt loss, loss of appetite, or other constitutional symptoms.  Pt states good ability with ADL's, has low fall risk, home safety reviewed and adequate, no other significant changes in hearing or vision, and not active with regular exercise.  Does have mild anterior neck pain, hoarse to wake in the AM, mouth dry. Had labs drawn this am.  No other complaints Past Medical History:  Diagnosis Date  . Allergic rhinitis   . Anxiety   . Aortic valve regurgitation 10/12/2010  . COPD (chronic obstructive pulmonary disease) (HCC)   . Disc disease, degenerative, cervical   . Diverticulosis of colon   . DJD (degenerative joint disease)    LS spine  . Gallstones   . History of colonic polyps   . Hyperlipidemia   . Hypertension   . Osteoporosis    Past Surgical History:  Procedure Laterality Date  . ABDOMINAL HYSTERECTOMY    . COLONOSCOPY W/ POLYPECTOMY    . OOPHORECTOMY      reports that she has quit smoking. She has never used smokeless tobacco. She reports that she does not drink alcohol or use drugs. family history includes Cancer in her mother and unknown relative; Coronary artery disease in her unknown relative; Diabetes in her mother and unknown relative; Hypertension in her unknown relative; Parkinsonism in her father; Stroke in her unknown relative. No Known  Allergies Current Outpatient Prescriptions on File Prior to Visit  Medication Sig Dispense Refill  . albuterol (PROVENTIL HFA;VENTOLIN HFA) 108 (90 Base) MCG/ACT inhaler Inhale 2 puffs into the lungs every 6 (six) hours as needed for wheezing or shortness of breath. 1 Inhaler 11  . aspirin 325 MG EC tablet Take 325 mg by mouth daily.      . diazepam (VALIUM) 5 MG tablet TAKE 1 TABLET BY MOUTH TWICE A DAY AS NEEDED 60 tablet 0  . diclofenac sodium (VOLTAREN) 1 % GEL Apply 1 application topically 4 (four) times daily. 1 Tube 11  . labetalol (NORMODYNE) 200 MG tablet TAKE 1 TABLET BY MOUTH TWICE A DAY. MUST KEEP JUNE 18TH APPT. FOR FUTURE REFILLS. 60 tablet 0  . rosuvastatin (CRESTOR) 10 MG tablet TAKE 1 TABLET (10 MG TOTAL) BY MOUTH DAILY. MUST KEEP JUNE 18TH APPT FOR FUTURE REFILLS 30 tablet 0  . spironolactone (ALDACTONE) 50 MG tablet TAKE 1 TABLET BY MOUTH TWICE A DAY 60 tablet 0  . fexofenadine (ALLEGRA) 180 MG tablet Take 1 tablet (180 mg total) by mouth daily. 90 tablet 3   No current facility-administered medications on file prior to visit.     Review of Systems Constitutional: Negative for other unusual diaphoresis, sweats, appetite or weight changes HENT: Negative for other worsening hearing loss, ear pain, facial swelling, mouth sores or neck stiffness.   Eyes: Negative for other worsening pain, redness or other visual disturbance.  Respiratory: Negative for other stridor or swelling Cardiovascular: Negative  for other palpitations or other chest pain  Gastrointestinal: Negative for worsening diarrhea or loose stools, blood in stool, distention or other pain Genitourinary: Negative for hematuria, flank pain or other change in urine volume.  Musculoskeletal: Negative for myalgias or other joint swelling.  Skin: Negative for other color change, or other wound or worsening drainage.  Neurological: Negative for other syncope or numbness. Hematological: Negative for other adenopathy or  swelling Psychiatric/Behavioral: Negative for hallucinations, other worsening agitation, SI, self-injury, or new decreased concentration All other system neg per pt    Objective:   Physical Exam BP 114/82   Pulse 89   Ht 5\' 4"  (1.626 m)   Wt 200 lb (90.7 kg)   SpO2 99%   BMI 34.33 kg/m  VS noted, obese Constitutional: Pt is oriented to person, place, and time. Appears well-developed and well-nourished, in no significant distress and comfortable Head: Normocephalic and atraumatic  Eyes: Conjunctivae and EOM are normal. Pupils are equal, round, and reactive to light Right Ear: External ear normal without discharge Left Ear: External ear normal without discharge Nose: Nose without discharge or deformity Mouth/Throat: Oropharynx is without other ulcerations and moist  Neck: Normal range of motion. Neck supple. No JVD present. No tracheal deviation present or significant neck LA or mass Cardiovascular: Normal rate, regular rhythm, normal heart sounds and intact distal pulses.   Pulmonary/Chest: WOB normal and breath sounds without rales or wheezing  Abdominal: Soft. Bowel sounds are normal. NT. No HSM  Musculoskeletal: Normal range of motion. Exhibits no edema Lymphadenopathy: Has no other cervical adenopathy.  Neurological: Pt is alert and oriented to person, place, and time. Pt has normal reflexes. No cranial nerve deficit. Motor grossly intact, Gait intact Skin: Skin is warm and dry. No rash noted or new ulcerations Psychiatric:  Has normal mood and affect. Behavior is normal without agitation No other exam findings  Lab Results  Component Value Date   WBC 4.7 09/15/2016   HGB 13.7 09/15/2016   HCT 40.6 09/15/2016   PLT 153.0 09/15/2016   GLUCOSE 113 (H) 09/15/2016   CHOL 181 09/15/2016   TRIG 89.0 09/15/2016   HDL 62.40 09/15/2016   LDLDIRECT 121.4 04/10/2013   LDLCALC 100 (H) 09/15/2016   ALT 19 09/15/2016   AST 21 09/15/2016   NA 138 09/15/2016   K 4.2 09/15/2016   CL  106 09/15/2016   CREATININE 1.21 (H) 09/15/2016   BUN 21 09/15/2016   CO2 24 09/15/2016   TSH 0.72 09/15/2016   ECG today I have personally interpreted Sinus  Bradycardia 69    Assessment & Plan:

## 2016-09-18 NOTE — Assessment & Plan Note (Signed)

## 2016-09-18 NOTE — Assessment & Plan Note (Signed)
stable overall by history and exam, recent data reviewed with pt, and pt to continue medical treatment as before,  to f/u any worsening symptoms or concerns BP Readings from Last 3 Encounters:  09/15/16 114/82  02/10/16 (!) 139/92  09/11/15 140/80

## 2016-10-12 ENCOUNTER — Other Ambulatory Visit: Payer: Self-pay | Admitting: Internal Medicine

## 2016-11-10 ENCOUNTER — Other Ambulatory Visit: Payer: Self-pay | Admitting: Internal Medicine

## 2016-11-10 NOTE — Telephone Encounter (Signed)
Faxed

## 2016-11-10 NOTE — Telephone Encounter (Signed)
Done hardcopy to Shirron  

## 2016-12-14 ENCOUNTER — Ambulatory Visit (INDEPENDENT_AMBULATORY_CARE_PROVIDER_SITE_OTHER): Payer: Medicare HMO | Admitting: General Practice

## 2016-12-14 DIAGNOSIS — Z23 Encounter for immunization: Secondary | ICD-10-CM

## 2016-12-20 ENCOUNTER — Ambulatory Visit: Payer: Medicare HMO | Admitting: Podiatry

## 2016-12-29 ENCOUNTER — Ambulatory Visit (INDEPENDENT_AMBULATORY_CARE_PROVIDER_SITE_OTHER): Payer: Medicare HMO | Admitting: Podiatry

## 2016-12-29 ENCOUNTER — Encounter: Payer: Self-pay | Admitting: Podiatry

## 2016-12-29 DIAGNOSIS — M79676 Pain in unspecified toe(s): Secondary | ICD-10-CM | POA: Diagnosis not present

## 2016-12-29 DIAGNOSIS — B351 Tinea unguium: Secondary | ICD-10-CM | POA: Diagnosis not present

## 2016-12-31 NOTE — Progress Notes (Signed)
   SUBJECTIVE Patient presents to office today complaining of elongated, thickened nails. Pain while ambulating in shoes. Patient is unable to trim their own nails.   Past Medical History:  Diagnosis Date  . Allergic rhinitis   . Anxiety   . Aortic valve regurgitation 10/12/2010  . COPD (chronic obstructive pulmonary disease) (HCC)   . Disc disease, degenerative, cervical   . Diverticulosis of colon   . DJD (degenerative joint disease)    LS spine  . Gallstones   . History of colonic polyps   . Hyperlipidemia   . Hypertension   . Osteoporosis     OBJECTIVE General Patient is awake, alert, and oriented x 3 and in no acute distress. Derm Skin is dry and supple bilateral. Negative open lesions or macerations. Remaining integument unremarkable. Nails are tender, long, thickened and dystrophic with subungual debris, consistent with onychomycosis, 1-5 bilateral. No signs of infection noted. Vasc  DP and PT pedal pulses palpable bilaterally. Temperature gradient within normal limits.  Neuro Epicritic and protective threshold sensation diminished bilaterally.  Musculoskeletal Exam No symptomatic pedal deformities noted bilateral. Muscular strength within normal limits.  ASSESSMENT 1. Onychodystrophic nails 1-5 bilateral with hyperkeratosis of nails.  2. Onychomycosis of nail due to dermatophyte bilateral 3. Pain in foot bilateral  PLAN OF CARE 1. Patient evaluated today.  2. Instructed to maintain good pedal hygiene and foot care.  3. Mechanical debridement of nails 1-5 bilaterally performed using a nail nipper. Filed with dremel without incident.  4. Return to clinic in 3 mos.    Felecia ShellingBrent M. Twyla Dais, DPM [facility]  Dr. Felecia ShellingBrent M. Lenwood Balsam, DPM    [address]                                        [place], [place] [address]                Office [phone]  Fax [phone]) [phone]

## 2017-06-21 ENCOUNTER — Other Ambulatory Visit: Payer: Self-pay | Admitting: Internal Medicine

## 2017-06-22 NOTE — Telephone Encounter (Signed)
Done erx 

## 2017-06-22 NOTE — Telephone Encounter (Signed)
11/10/2016 60# 

## 2017-08-23 DIAGNOSIS — Z1231 Encounter for screening mammogram for malignant neoplasm of breast: Secondary | ICD-10-CM | POA: Diagnosis not present

## 2017-08-23 LAB — HM MAMMOGRAPHY

## 2017-09-16 ENCOUNTER — Encounter: Payer: Medicare HMO | Admitting: Internal Medicine

## 2017-09-26 ENCOUNTER — Other Ambulatory Visit (INDEPENDENT_AMBULATORY_CARE_PROVIDER_SITE_OTHER): Payer: Medicare HMO

## 2017-09-26 ENCOUNTER — Ambulatory Visit (INDEPENDENT_AMBULATORY_CARE_PROVIDER_SITE_OTHER): Payer: Medicare HMO | Admitting: Internal Medicine

## 2017-09-26 ENCOUNTER — Encounter: Payer: Self-pay | Admitting: Internal Medicine

## 2017-09-26 VITALS — BP 136/84 | HR 70 | Temp 97.9°F | Ht 64.0 in | Wt 192.0 lb

## 2017-09-26 DIAGNOSIS — R739 Hyperglycemia, unspecified: Secondary | ICD-10-CM | POA: Insufficient documentation

## 2017-09-26 DIAGNOSIS — Z0000 Encounter for general adult medical examination without abnormal findings: Secondary | ICD-10-CM | POA: Diagnosis not present

## 2017-09-26 DIAGNOSIS — N183 Chronic kidney disease, stage 3 unspecified: Secondary | ICD-10-CM | POA: Insufficient documentation

## 2017-09-26 HISTORY — DX: Chronic kidney disease, stage 3 unspecified: N18.30

## 2017-09-26 LAB — CBC WITH DIFFERENTIAL/PLATELET
BASOS ABS: 0.1 10*3/uL (ref 0.0–0.1)
BASOS PCT: 1 % (ref 0.0–3.0)
EOS ABS: 0.3 10*3/uL (ref 0.0–0.7)
Eosinophils Relative: 5.2 % — ABNORMAL HIGH (ref 0.0–5.0)
HCT: 38.7 % (ref 36.0–46.0)
Hemoglobin: 13 g/dL (ref 12.0–15.0)
LYMPHS ABS: 2.2 10*3/uL (ref 0.7–4.0)
Lymphocytes Relative: 41.3 % (ref 12.0–46.0)
MCHC: 33.6 g/dL (ref 30.0–36.0)
MCV: 87.3 fl (ref 78.0–100.0)
Monocytes Absolute: 0.6 10*3/uL (ref 0.1–1.0)
Monocytes Relative: 10.9 % (ref 3.0–12.0)
NEUTROS ABS: 2.2 10*3/uL (ref 1.4–7.7)
NEUTROS PCT: 41.6 % — AB (ref 43.0–77.0)
PLATELETS: 154 10*3/uL (ref 150.0–400.0)
RBC: 4.43 Mil/uL (ref 3.87–5.11)
RDW: 14.4 % (ref 11.5–15.5)
WBC: 5.4 10*3/uL (ref 4.0–10.5)

## 2017-09-26 LAB — URINALYSIS, ROUTINE W REFLEX MICROSCOPIC
Bilirubin Urine: NEGATIVE
Hgb urine dipstick: NEGATIVE
NITRITE: NEGATIVE
PH: 6 (ref 5.0–8.0)
RBC / HPF: NONE SEEN (ref 0–?)
SPECIFIC GRAVITY, URINE: 1.02 (ref 1.000–1.030)
TOTAL PROTEIN, URINE-UPE24: NEGATIVE
Urine Glucose: NEGATIVE
Urobilinogen, UA: 0.2 (ref 0.0–1.0)

## 2017-09-26 LAB — BASIC METABOLIC PANEL
BUN: 20 mg/dL (ref 6–23)
CO2: 19 mEq/L (ref 19–32)
Calcium: 9.8 mg/dL (ref 8.4–10.5)
Chloride: 107 mEq/L (ref 96–112)
Creatinine, Ser: 1.76 mg/dL — ABNORMAL HIGH (ref 0.40–1.20)
GFR: 35.52 mL/min — AB (ref >60.00)
Glucose, Bld: 109 mg/dL — ABNORMAL HIGH (ref 70–99)
POTASSIUM: 4.4 meq/L (ref 3.5–5.1)
Sodium: 140 mEq/L (ref 135–145)

## 2017-09-26 LAB — TSH: TSH: 1.64 u[IU]/mL (ref 0.35–4.50)

## 2017-09-26 LAB — HEPATIC FUNCTION PANEL
ALT: 16 U/L (ref 0–35)
AST: 19 U/L (ref 0–37)
Albumin: 4.3 g/dL (ref 3.5–5.2)
Alkaline Phosphatase: 62 U/L (ref 39–117)
BILIRUBIN DIRECT: 0.1 mg/dL (ref 0.0–0.3)
BILIRUBIN TOTAL: 0.4 mg/dL (ref 0.2–1.2)
TOTAL PROTEIN: 8.1 g/dL (ref 6.0–8.3)

## 2017-09-26 LAB — LIPID PANEL
CHOLESTEROL: 199 mg/dL (ref 0–200)
HDL: 61.3 mg/dL (ref >39.00)
LDL Cholesterol: 120 mg/dL — ABNORMAL HIGH (ref 0–99)
NonHDL: 138.11
Total CHOL/HDL Ratio: 3
Triglycerides: 93 mg/dL (ref 0.0–149.0)
VLDL: 18.6 mg/dL (ref 0.0–40.0)

## 2017-09-26 NOTE — Assessment & Plan Note (Signed)

## 2017-09-26 NOTE — Assessment & Plan Note (Signed)
Mild, for a1c with labs 

## 2017-09-26 NOTE — Progress Notes (Signed)
Subjective:    Patient ID: Megan Baldwin, female    DOB: 1933-05-28, 84 y.o.   MRN: [id number]  HPI   Here for wellness and f/u;  Overall doing ok;  Pt denies Chest pain, worsening SOB, DOE, wheezing, orthopnea, PND, worsening LE edema, palpitations, dizziness or syncope.  Pt denies neurological change such as new headache, facial or extremity weakness.  Pt denies polydipsia, polyuria, or low sugar symptoms. Pt states overall good compliance with treatment and medications, good tolerability, and has been trying to follow appropriate diet.  Pt denies worsening depressive symptoms, suicidal ideation or panic. No fever, night sweats, wt loss, loss of appetite, or other constitutional symptoms.  Pt states good ability with ADL's, has low fall risk, home safety reviewed and adequate, no other significant changes in hearing or vision, and occasionally active with exercise.  Most of her friends ar e passed.  Has cataract left eye likely needs surgury, plans to make appt herself. Past Medical History:  Diagnosis Date  . Allergic rhinitis   . Anxiety   . Aortic valve regurgitation 10/12/2010  . COPD (chronic obstructive pulmonary disease) (HCC)   . Disc disease, degenerative, cervical   . Diverticulosis of colon   . DJD (degenerative joint disease)    LS spine  . Gallstones   . History of colonic polyps   . Hyperlipidemia   . Hypertension   . Osteoporosis    Past Surgical History:  Procedure Laterality Date  . ABDOMINAL HYSTERECTOMY    . COLONOSCOPY W/ POLYPECTOMY    . OOPHORECTOMY      reports that she has quit smoking. She has never used smokeless tobacco. She reports that she does not drink alcohol or use drugs. family history includes Cancer in her mother and unknown relative; Coronary artery disease in her unknown relative; Diabetes in her mother and unknown relative; Hypertension in her unknown relative; Parkinsonism in her father; Stroke in her unknown relative. No Known  Allergies Current Outpatient Medications on File Prior to Visit  Medication Sig Dispense Refill  . albuterol (PROVENTIL HFA;VENTOLIN HFA) 108 (90 Base) MCG/ACT inhaler Inhale 2 puffs into the lungs every 6 (six) hours as needed for wheezing or shortness of breath. 1 Inhaler 11  . aspirin 325 MG EC tablet Take 325 mg by mouth daily.      . diazepam (VALIUM) 5 MG tablet TAKE 1 TABLET BY MOUTH TWICE A DAY AS NEEDED 60 tablet 0  . diclofenac sodium (VOLTAREN) 1 % GEL Apply 1 application topically 4 (four) times daily. 1 Tube 11  . labetalol (NORMODYNE) 200 MG tablet TAKE 1 TABLET BY MOUTH TWICE A DAY. 60 tablet 11  . rosuvastatin (CRESTOR) 10 MG tablet Take 1 tablet (10 mg total) by mouth daily. 30 tablet 11  . spironolactone (ALDACTONE) 50 MG tablet TAKE 1 TABLET BY MOUTH TWICE A DAY 60 tablet 11  . fexofenadine (ALLEGRA) 180 MG tablet Take 1 tablet (180 mg total) by mouth daily. 90 tablet 3   No current facility-administered medications on file prior to visit.    Review of Systems Constitutional: Negative for other unusual diaphoresis, sweats, appetite or weight changes HENT: Negative for other worsening hearing loss, ear pain, facial swelling, mouth sores or neck stiffness.   Eyes: Negative for other worsening pain, redness or other visual disturbance.  Respiratory: Negative for other stridor or swelling Cardiovascular: Negative for other palpitations or other chest pain  Gastrointestinal: Negative for worsening diarrhea or loose stools, blood in stool, distention  or other pain Genitourinary: Negative for hematuria, flank pain or other change in urine volume.  Musculoskeletal: Negative for myalgias or other joint swelling.  Skin: Negative for other color change, or other wound or worsening drainage.  Neurological: Negative for other syncope or numbness. Hematological: Negative for other adenopathy or swelling Psychiatric/Behavioral: Negative for hallucinations, other worsening agitation, SI,  self-injury, or new decreased concentration All other system neg per pt    Objective:   Physical Exam BP 136/84   Pulse 70   Temp 97.9 F (36.6 C) (Oral)   Ht 5\' 4"  (1.626 m)   Wt 192 lb (87.1 kg)   BMI 32.96 kg/m  VS noted,  Constitutional: Pt is oriented to person, place, and time. Appears well-developed and well-nourished, in no significant distress and comfortable Head: Normocephalic and atraumatic  Eyes: Conjunctivae and EOM are normal. Pupils are equal, round, and reactive to light Right Ear: External ear normal without discharge Left Ear: External ear normal without discharge Nose: Nose without discharge or deformity Mouth/Throat: Oropharynx is without other ulcerations and moist  Neck: Normal range of motion. Neck supple. No JVD present. No tracheal deviation present or significant neck LA or mass Cardiovascular: Normal rate, regular rhythm, normal heart sounds and intact distal pulses.   Pulmonary/Chest: WOB normal and breath sounds without rales or wheezing  Abdominal: Soft. Bowel sounds are normal. NT. No HSM  Musculoskeletal: Normal range of motion. Exhibits no edema Lymphadenopathy: Has no other cervical adenopathy.  Neurological: Pt is alert and oriented to person, place, and time. Pt has normal reflexes. No cranial nerve deficit. Motor grossly intact, Gait intact Skin: Skin is warm and dry. No rash noted or new ulcerations Psychiatric:  Has normal mood and affect. Behavior is normal without agitation No other exam findings Lab Results  Component Value Date   WBC 5.4 09/26/2017   HGB 13.0 09/26/2017   HCT 38.7 09/26/2017   PLT 154.0 09/26/2017   GLUCOSE 109 (H) 09/26/2017   CHOL 199 09/26/2017   TRIG 93.0 09/26/2017   HDL 61.30 09/26/2017   LDLDIRECT 121.4 04/10/2013   LDLCALC 120 (H) 09/26/2017   ALT 16 09/26/2017   AST 19 09/26/2017   NA 140 09/26/2017   K 4.4 09/26/2017   CL 107 09/26/2017   CREATININE 1.76 (H) 09/26/2017   BUN 20 09/26/2017   CO2 19  09/26/2017   TSH 1.64 09/26/2017       Assessment & Plan:

## 2017-09-26 NOTE — Assessment & Plan Note (Signed)
For f/u labs 

## 2017-09-26 NOTE — Patient Instructions (Addendum)
Please remember to make an appt with your eye doctor  Please continue all other medications as before, and refills have been done if requested.  Please have the pharmacy call with any other refills you may need.  Please continue your efforts at being more active, low cholesterol diet, and weight control.  You are otherwise up to date with prevention measures today.  Please keep your appointments with your specialists as you may have planned  Please return in 1 year for your yearly visit, or sooner if needed, with Lab testing done 3-5 days before

## 2017-09-28 ENCOUNTER — Telehealth: Payer: Self-pay

## 2017-09-28 ENCOUNTER — Encounter: Payer: Self-pay | Admitting: Internal Medicine

## 2017-09-28 ENCOUNTER — Other Ambulatory Visit: Payer: Self-pay | Admitting: Internal Medicine

## 2017-09-28 DIAGNOSIS — N179 Acute kidney failure, unspecified: Secondary | ICD-10-CM

## 2017-09-28 NOTE — Telephone Encounter (Signed)
-----   Message from Corwin LevinsJames W John, MD sent at 09/28/2017  8:07 AM EDT ----- Left message on MyChart, pt to cont same tx except  The test results show that your current treatment is OK, except the kidney function is much slower. This is likely related to the relatively high dose fluid pill (spironolactone).  Please HOLD on taking this completely for the next 4 days, and return to the lab on Monday August 5 if possible to repeat the test.  I suspect it will be better, and then we could consider adding back the fluid pill possibly at once per day, instead of twice per day  Shirron to please inform pt, I will do lab order

## 2017-09-28 NOTE — Telephone Encounter (Signed)
Pt has been informed and expressed understanding.  

## 2017-10-03 ENCOUNTER — Other Ambulatory Visit (INDEPENDENT_AMBULATORY_CARE_PROVIDER_SITE_OTHER): Payer: Medicare HMO

## 2017-10-03 ENCOUNTER — Other Ambulatory Visit: Payer: Self-pay | Admitting: Internal Medicine

## 2017-10-03 ENCOUNTER — Encounter: Payer: Self-pay | Admitting: Internal Medicine

## 2017-10-03 DIAGNOSIS — N179 Acute kidney failure, unspecified: Secondary | ICD-10-CM | POA: Diagnosis not present

## 2017-10-03 LAB — BASIC METABOLIC PANEL
BUN: 18 mg/dL (ref 6–23)
CHLORIDE: 105 meq/L (ref 96–112)
CO2: 24 meq/L (ref 19–32)
CREATININE: 1.28 mg/dL — AB (ref 0.40–1.20)
Calcium: 10.1 mg/dL (ref 8.4–10.5)
GFR: 51.29 mL/min — ABNORMAL LOW (ref >60.00)
GLUCOSE: 131 mg/dL — AB (ref 70–99)
POTASSIUM: 4.1 meq/L (ref 3.5–5.1)
Sodium: 138 mEq/L (ref 135–145)

## 2017-10-03 MED ORDER — SPIRONOLACTONE 50 MG PO TABS: 50.0000 mg | ORAL_TABLET | Freq: Every day | ORAL | 11 refills | Status: DC

## 2017-10-04 ENCOUNTER — Telehealth: Payer: Self-pay | Admitting: Emergency Medicine

## 2017-10-04 NOTE — Telephone Encounter (Signed)
Pt will call back to schedule AWV. It can be scheduled at anytime. Thanks.

## 2017-10-05 ENCOUNTER — Telehealth: Payer: Self-pay

## 2017-10-05 NOTE — Telephone Encounter (Signed)
It is actually a lower dose at the 50 mg bid was changed to once daily only

## 2017-10-05 NOTE — Telephone Encounter (Signed)
Copied from CRM [id number]. Topic: General - Other >> Oct 05, 2017 11:22 AM Percival SpanishKennedy, Cheryl W wrote:  Pt call to say she thought the below med was going to be a lower dose cause she does not want to keep taking this if it is going to affect her  kidneys.    spironolactone (ALDACTONE) 50 MG tablet

## 2017-10-06 NOTE — Telephone Encounter (Signed)
Pt has been informed and expressed understanding.  

## 2017-10-19 ENCOUNTER — Other Ambulatory Visit: Payer: Self-pay | Admitting: Internal Medicine

## 2017-10-19 ENCOUNTER — Ambulatory Visit: Payer: Medicare HMO | Admitting: Podiatry

## 2017-10-19 DIAGNOSIS — M79676 Pain in unspecified toe(s): Secondary | ICD-10-CM

## 2017-10-19 DIAGNOSIS — J449 Chronic obstructive pulmonary disease, unspecified: Secondary | ICD-10-CM | POA: Diagnosis not present

## 2017-10-19 DIAGNOSIS — B351 Tinea unguium: Secondary | ICD-10-CM

## 2017-10-23 NOTE — Progress Notes (Signed)
   SUBJECTIVE Patient presents to office today complaining of elongated, thickened nails that cause pain while ambulating in shoes. She is unable to trim her own nails. Patient is here for further evaluation and treatment.  Past Medical History:  Diagnosis Date  . Allergic rhinitis   . Anxiety   . Aortic valve regurgitation 10/12/2010  . CKD (chronic kidney disease) stage 3, GFR 30-59 ml/min (HCC) 09/26/2017  . COPD (chronic obstructive pulmonary disease) (HCC)   . Disc disease, degenerative, cervical   . Diverticulosis of colon   . DJD (degenerative joint disease)    LS spine  . Gallstones   . History of colonic polyps   . Hyperlipidemia   . Hypertension   . Osteoporosis     OBJECTIVE General Patient is awake, alert, and oriented x 3 and in no acute distress. Derm Skin is dry and supple bilateral. Negative open lesions or macerations. Remaining integument unremarkable. Nails are tender, long, thickened and dystrophic with subungual debris, consistent with onychomycosis, 1-5 bilateral. No signs of infection noted. Vasc  DP and PT pedal pulses palpable bilaterally. Temperature gradient within normal limits.  Neuro Epicritic and protective threshold sensation grossly intact bilaterally.  Musculoskeletal Exam No symptomatic pedal deformities noted bilateral. Muscular strength within normal limits.  ASSESSMENT 1. Onychodystrophic nails 1-5 bilateral with hyperkeratosis of nails.  2. Onychomycosis of nail due to dermatophyte bilateral 3. Pain in foot bilateral  PLAN OF CARE 1. Patient evaluated today.  2. Instructed to maintain good pedal hygiene and foot care.  3. Mechanical debridement of nails 1-5 bilaterally performed using a nail nipper. Filed with dremel without incident.  4. Return to clinic in 3 mos.    Felecia ShellingBrent M. Evans, DPM [facility]  Dr. Felecia ShellingBrent M. Evans, DPM    [address]                                        [place], [place] [id number]                 Office [phone]  Fax [phone]

## 2017-11-15 DIAGNOSIS — H25812 Combined forms of age-related cataract, left eye: Secondary | ICD-10-CM | POA: Diagnosis not present

## 2017-11-15 DIAGNOSIS — H35033 Hypertensive retinopathy, bilateral: Secondary | ICD-10-CM | POA: Diagnosis not present

## 2017-11-15 DIAGNOSIS — H3562 Retinal hemorrhage, left eye: Secondary | ICD-10-CM | POA: Diagnosis not present

## 2017-11-15 DIAGNOSIS — H26491 Other secondary cataract, right eye: Secondary | ICD-10-CM | POA: Diagnosis not present

## 2017-11-21 NOTE — Progress Notes (Addendum)
[facility] Note  11/22/2017     CHIEF COMPLAINT Patient presents for Retina Evaluation   HISTORY OF PRESENT ILLNESS: Megan Baldwin is a 84 y.o. female who presents to the clinic today for:   HPI    Retina Evaluation    In both eyes.  This started 1 year ago.  Associated Symptoms Negative for Flashes, Floaters, Distortion, Redness, Trauma, Pain, Blind Spot, Photophobia, Scalp Tenderness, Fever, Weight Loss, Jaw Claudication, Glare, Shoulder/Hip pain and Fatigue.  Context:  distance vision, mid-range vision and near vision.  Treatments tried include laser and surgery.  Response to treatment was moderate improvement.  I, the attending physician,  performed the HPI with the patient and updated documentation appropriately.          Comments    Referral of [name] Pronto for retina eval./possible hem w/mac edema. Patient states over the past year she has noticed "she can not see as well as she once did". Denies flashes, floaters and ocular pain.Denies gtt's/vit's       Last edited by [name], MD on 11/22/2017 10:38 AM. (History)    Pt states she saw [name] Pronto for blurred VA; Pt states "it was over time for me to have an eye exam"; Pt states she was having trouble reading x 1 year; Pt endorses HTN, states highest BP has gotten has been 170/70;   Referring physician: [name], [address] [place], [place] [address]  HISTORICAL INFORMATION:   Selected notes from the MEDICAL RECORD NUMBER Referred by Dr. [name] for concern of retinal hemorrhage OS with +RAM sup temp LEE: 09.19.19 ([name]) [BCVA: OD: 20/ OS: 20/60-2] Ocular Hx-cataract OS, PCO OD, HTN ret OU, pseudo OD PMH-HTN, anxiety, CKD, COPD, HLD    CURRENT MEDICATIONS: No current outpatient medications on file. (Ophthalmic Drugs)   No current facility-administered medications for this visit.  (Ophthalmic Drugs)   Current Outpatient Medications (Other)  Medication  Sig  . albuterol (PROVENTIL HFA;VENTOLIN HFA) 108 (90 Base) MCG/ACT inhaler Inhale 2 puffs into the lungs every 6 (six) hours as needed for wheezing or shortness of breath.  Marland Kitchen aspirin 325 MG EC tablet Take 325 mg by mouth daily.    . diazepam (VALIUM) 5 MG tablet TAKE 1 TABLET BY MOUTH TWICE A DAY AS NEEDED  . diclofenac sodium (VOLTAREN) 1 % GEL Apply 1 application topically 4 (four) times daily.  Marland Kitchen labetalol (NORMODYNE) 200 MG tablet TAKE 1 TABLET BY MOUTH TWICE A DAY  . rosuvastatin (CRESTOR) 10 MG tablet Take 1 tablet (10 mg total) by mouth daily.  Marland Kitchen spironolactone (ALDACTONE) 50 MG tablet Take 1 tablet (50 mg total) by mouth daily.  Marland Kitchen spironolactone (ALDACTONE) 50 MG tablet TAKE 1 TABLET BY MOUTH TWICE A DAY  . fexofenadine (ALLEGRA) 180 MG tablet Take 1 tablet (180 mg total) by mouth daily.   Current Facility-Administered Medications (Other)  Medication Route  . Bevacizumab (AVASTIN) SOLN 1.25 mg Intravitreal      REVIEW OF SYSTEMS: ROS    Positive for: Eyes   Negative for: Constitutional, Gastrointestinal, Neurological, Skin, Genitourinary, Musculoskeletal, HENT, Endocrine, Cardiovascular, Respiratory, Psychiatric, Allergic/Imm, Heme/Lymph   Last edited by Zenovia Jordan, LPN on 6/38/9373  4:28 AM. (History)       ALLERGIES No Known Allergies  PAST MEDICAL HISTORY Past Medical History:  Diagnosis Date  . Allergic rhinitis   . Anxiety   . Aortic valve regurgitation 10/12/2010  . CKD (chronic kidney  disease) stage 3, GFR 30-59 ml/min (HCC) 09/26/2017  . COPD (chronic obstructive pulmonary disease) ([place])   . Disc disease, degenerative, cervical   . Diverticulosis of colon   . DJD (degenerative joint disease)    LS spine  . Gallstones   . History of colonic polyps   . Hyperlipidemia   . Hypertension   . Osteoporosis    Past Surgical History:  Procedure Laterality Date  . ABDOMINAL HYSTERECTOMY    . CATARACT EXTRACTION    . COLONOSCOPY W/ POLYPECTOMY    . EYE  SURGERY    . OOPHORECTOMY      FAMILY HISTORY Family History  Problem Relation Age of Onset  . Stroke Unknown        F 1st degree relative  . Parkinsonism Father   . Cancer Unknown        1st degree relative <50  . Coronary artery disease Unknown        F 1st degree relative <60  . Diabetes Unknown        1st degree relative  . Hypertension Unknown   . Diabetes Mother   . Cancer Mother     SOCIAL HISTORY Social History   Tobacco Use  . Smoking status: Former Research scientist (life sciences)  . Smokeless tobacco: Never Used  Substance Use Topics  . Alcohol use: No    Alcohol/week: 0.0 standard drinks  . Drug use: No         OPHTHALMIC EXAM:  Base Eye Exam    Visual Acuity (Snellen - Linear)      Right Left   Dist [place] 20/50 20/50   Dist ph  NI NI       Tonometry (Tonopen, 8:38 AM)      Right Left   Pressure 18 15       Pupils      Dark Light Shape React APD   Right 4 3 Round Brisk None   Left 4 3 Round Brisk None       Visual Fields (Counting fingers)      Left Right    Full Full       Extraocular Movement      Right Left    Full, Ortho Full, Ortho       Neuro/Psych    Oriented x3:  Yes   Mood/Affect:  Normal       Dilation    Both eyes:  1.0% Mydriacyl, 2.5% Phenylephrine @ 8:32 AM        Slit Lamp and Fundus Exam    Slit Lamp Exam      Right Left   Lids/Lashes Dermatochalasis - upper lid, Meibomian gland dysfunction Dermatochalasis - upper lid, Meibomian gland dysfunction   Conjunctiva/Sclera Melanosis Melanosis   Cornea Arcus, 1+ Punctate epithelial erosions Arcus, 1+ Punctate epithelial erosions   Anterior Chamber Deep and quiet Deep and quiet   Iris Round and dilated, No NVI Round and dilated, No NVI   Lens PC IOL in good position 2+ Nuclear sclerosis, 2+ Cortical cataract   Vitreous Vitreous syneresis Vitreous syneresis       Fundus Exam      Right Left   Disc Pink and Sharp Pink and Sharp   C/D Ratio 0.6 0.6   Macula Blunted foveal reflex,  Retinal pigment epithelial mottling, No heme or edema Blunted foveal reflex, central Exudates, +IRF, +SRF, RAM ST to fovea with surrounding heme and exudate   Vessels Copper wiring, AV crossing changes, punctate focal area of  arterioler sheathing superior macula Vascular attenuation, Copper wiring; BRVO; RAM ST arcades   Periphery Attached Attached, RPE mottling at 0300 to 0430        Refraction    Manifest Refraction      Sphere Cylinder Dist VA   Right +0.75 Sphere 20/40+2   Left [place] Sphere 20/50          IMAGING AND PROCEDURES  Imaging and Procedures for _0 @  OCT, Retina - OU - Both Eyes       Right Eye Quality was good. Central Foveal Thickness: 219. Progression has no prior data. Findings include abnormal foveal contour, no IRF, no SRF, retinal drusen  (Fine central ellipsoid disruptions ).   Left Eye Quality was good. Central Foveal Thickness: 402. Progression has no prior data. Findings include abnormal foveal contour, intraretinal fluid, subretinal fluid, intraretinal hyper-reflective material.   Notes *Images captured and stored on drive  Diagnosis / Impression:  OD: No IRF/SRF OS: abnormal foveal contour -- +IRF/SRF  Clinical management:  See below  Abbreviations: NFP - Normal foveal profile. CME - cystoid macular edema. PED - pigment epithelial detachment. IRF - intraretinal fluid. SRF - subretinal fluid. EZ - ellipsoid zone. ERM - epiretinal membrane. ORA - outer retinal atrophy. ORT - outer retinal tubulation. SRHM - subretinal hyper-reflective material         Fluorescein Angiography Optos (Transit OS)       Right Eye   Progression has no prior data. Early phase findings include normal observations. Mid/Late phase findings include normal observations.   Left Eye   Progression has no prior data. Early phase findings include blockage. Mid/Late phase findings include blockage.   Notes Images captured and stored on drive;    Impression: Low fluorescein signal OU OD - normal study OS - central blockage -- no obvious leakage visible         Intravitreal Injection, Pharmacologic Agent - OS - Left Eye       Time Out 11/22/2017. 10:49 AM. Confirmed correct patient, procedure, site, and patient consented.   Anesthesia Topical anesthesia was used. Anesthetic medications included Tetracaine 0.5%, Lidocaine 2%.   Procedure Preparation included 5% betadine to ocular surface, eyelid speculum. A 30 gauge needle was used.   Injection:  1.25 mg Bevacizumab (AVASTIN) SOLN 1.25 mg   NDC: 50242-060-01, Lot: [id number] , Expiration date: 12/31/2017   Route: Intravitreal, Site: Left Eye, Waste: 0 mg  Post-op Post injection exam found visual acuity of at least counting fingers. The patient tolerated the procedure well. There were no complications. The patient received written and verbal post procedure care education.                 ASSESSMENT/PLAN:    ICD-10-CM   1. 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 Branch retinal vein occlusion of left eye with macular edema L24.4010 Fluorescein Angiography Optos (Transit OS)    Intravitreal Injection, Pharmacologic Agent - OS - Left Eye    Bevacizumab (AVASTIN) SOLN 1.25 mg  3. Retinal edema H35.81 OCT, Retina - OU - Both Eyes  4. Essential hypertension I10   5. Hypertensive retinopathy of both eyes H35.033 Fluorescein Angiography Optos (Transit OS)  6. Combined forms of age-related cataract of left eye H25.812   7. Pseudophakia Z96.1     1-3. Retinal arterial macroaneurysm and BRVO w/ edema OS - OCT macula shows +IRF/SRF - FA (09.24.19) shows blockage  - recommend IVA OS #1 today (09.24.19) - pt  wishes to proceed - RBA of procedure discussed, questions answered - informed consent obtained and signed - see procedure note - F/U 4 weeks  4,5. Hypertensive retinopathy OU - discussed importance of tight  BP control - monitor  6. Combined form age related cataract OS-  - The symptoms of cataract, surgical options, and treatments and risks were discussed with patient. - discussed diagnosis and progression - not yet visually significant - monitor for now  7. Pseudophakia OD  - s/p CE/IOL OD  - doing well  - monitor   Ophthalmic Meds Ordered this visit:  Meds ordered this encounter  Medications  . Bevacizumab (AVASTIN) SOLN 1.25 mg       Return in about 4 weeks (around 12/20/2017) for F/U BRVO OS, DFE, OCT.  There are no Patient Instructions on file for this visit.   Explained the diagnoses, plan, and follow up with the patient and they expressed understanding.  Patient expressed understanding of the importance of proper follow up care.   This document serves as a record of services personally performed by Gardiner Sleeper, MD, PhD. It was created on their behalf by Ernest Mallick, OA, an ophthalmic assistant. The creation of this record is the provider's dictation and/or activities during the visit.    Electronically signed by: Ernest Mallick, OA  09.23.19 1:32 PM   This document serves as a record of services personally performed by Gardiner Sleeper, MD, PhD. It was created on their behalf by Catha Brow, [place], a certified ophthalmic assistant. The creation of this record is the provider's dictation and/or activities during the visit.  Electronically signed by: Catha Brow, COA  09.24.19 1:32 PM    Gardiner Sleeper, M.D., Ph.D. Diseases & Surgery of the Retina and Vitreous [facility]   I have reviewed the above documentation for accuracy and completeness, and I agree with the above. Gardiner Sleeper, M.D., Ph.D. 11/23/17 1:32 PM    Abbreviations: M myopia (nearsighted); A astigmatism; H hyperopia (farsighted); P presbyopia; Mrx spectacle prescription;  CTL contact lenses; OD right eye; OS left eye; OU both eyes  XT exotropia; ET esotropia; PEK  punctate epithelial keratitis; PEE punctate epithelial erosions; DES dry eye syndrome; MGD meibomian gland dysfunction; ATs artificial tears; PFAT's preservative free artificial tears; [place] nuclear sclerotic cataract; PSC posterior subcapsular cataract; ERM epi-retinal membrane; PVD posterior vitreous detachment; RD retinal detachment; DM diabetes mellitus; DR diabetic retinopathy; NPDR non-proliferative diabetic retinopathy; PDR proliferative diabetic retinopathy; CSME clinically significant macular edema; DME diabetic macular edema; dbh dot blot hemorrhages; CWS cotton wool spot; POAG primary open angle glaucoma; C/D cup-to-disc ratio; HVF humphrey visual field; GVF goldmann visual field; OCT optical coherence tomography; IOP intraocular pressure; BRVO Branch retinal vein occlusion; CRVO central retinal vein occlusion; CRAO central retinal artery occlusion; BRAO branch retinal artery occlusion; RT retinal tear; SB scleral buckle; PPV pars plana vitrectomy; VH Vitreous hemorrhage; PRP panretinal laser photocoagulation; IVK intravitreal kenalog; VMT vitreomacular traction; MH Macular hole;  NVD neovascularization of the disc; NVE neovascularization elsewhere; AREDS age related eye disease study; ARMD age related macular degeneration; POAG primary open angle glaucoma; EBMD epithelial/anterior basement membrane dystrophy; ACIOL anterior chamber intraocular lens; IOL intraocular lens; PCIOL posterior chamber intraocular lens; Phaco/IOL phacoemulsification with intraocular lens placement; [place] photorefractive keratectomy; LASIK laser assisted in situ keratomileusis; HTN hypertension; DM diabetes mellitus; COPD chronic obstructive pulmonary disease

## 2017-11-22 ENCOUNTER — Encounter (INDEPENDENT_AMBULATORY_CARE_PROVIDER_SITE_OTHER): Payer: Self-pay | Admitting: Ophthalmology

## 2017-11-22 ENCOUNTER — Ambulatory Visit (INDEPENDENT_AMBULATORY_CARE_PROVIDER_SITE_OTHER): Payer: Medicare HMO | Admitting: Ophthalmology

## 2017-11-22 DIAGNOSIS — H35012 Changes in retinal vascular appearance, left eye: Secondary | ICD-10-CM | POA: Diagnosis not present

## 2017-11-22 DIAGNOSIS — H3581 Retinal edema: Secondary | ICD-10-CM | POA: Diagnosis not present

## 2017-11-22 DIAGNOSIS — H25812 Combined forms of age-related cataract, left eye: Secondary | ICD-10-CM

## 2017-11-22 DIAGNOSIS — I10 Essential (primary) hypertension: Secondary | ICD-10-CM

## 2017-11-22 DIAGNOSIS — H35033 Hypertensive retinopathy, bilateral: Secondary | ICD-10-CM | POA: Diagnosis not present

## 2017-11-22 DIAGNOSIS — H34832 Tributary (branch) retinal vein occlusion, left eye, with macular edema: Secondary | ICD-10-CM | POA: Diagnosis not present

## 2017-11-22 DIAGNOSIS — Z961 Presence of intraocular lens: Secondary | ICD-10-CM

## 2017-11-22 MED ORDER — BEVACIZUMAB CHEMO INJECTION 1.25MG/0.05ML SYRINGE FOR KALEIDOSCOPE
1.2500 mg | INTRAVITREAL | Status: DC
  Administered 2017-11-22: 1.25 mg via INTRAVITREAL

## 2017-12-19 NOTE — Progress Notes (Signed)
[facility] Note  12/20/2017     CHIEF COMPLAINT Patient presents for Retina Follow Up   HISTORY OF PRESENT ILLNESS: Megan Baldwin is a 84 y.o. female who presents to the clinic today for:   HPI    Retina Follow Up    Patient presents with  Other.  In left eye.  This started 4 weeks ago.  Severity is mild.  Since onset it is stable.  I, the attending physician,  performed the HPI with the patient and updated documentation appropriately.          Comments    F/u BRVO os. Patient states vision is "about the same' denies new visual onsets.Patient states she is concerned about the "bags" under her eyes. Pt is ready for tx today if indicted.Pt daughter is here  from [place] with mother rtoday.       Last edited by [name], MD on 12/20/2017 10:47 AM. (History)    Patient states she feels dizzy at times if she doesn't eat on a regular schedule. Last BP was 149/79.    Referring physician: Biagio Borg, MD [place], [place] [address]  HISTORICAL INFORMATION:   Selected notes from the MEDICAL RECORD NUMBER Referred by Dr. [name] for concern of retinal hemorrhage OS with +RAM sup temp LEE: 09.19.19 ([name]) [BCVA: OD: 20/ OS: 20/60-2] Ocular Hx-cataract OS, PCO OD, HTN ret OU, pseudo OD PMH-HTN, anxiety, CKD, COPD, HLD    CURRENT MEDICATIONS: No current outpatient medications on file. (Ophthalmic Drugs)   No current facility-administered medications for this visit.  (Ophthalmic Drugs)   Current Outpatient Medications (Other)  Medication Sig  . albuterol (PROVENTIL HFA;VENTOLIN HFA) 108 (90 Base) MCG/ACT inhaler Inhale 2 puffs into the lungs every 6 (six) hours as needed for wheezing or shortness of breath.  Marland Kitchen aspirin 325 MG EC tablet Take 325 mg by mouth daily.    . diazepam (VALIUM) 5 MG tablet TAKE 1 TABLET BY MOUTH TWICE A DAY AS NEEDED  . diclofenac sodium (VOLTAREN) 1 % GEL Apply 1 application topically 4  (four) times daily.  Marland Kitchen labetalol (NORMODYNE) 200 MG tablet TAKE 1 TABLET BY MOUTH TWICE A DAY  . rosuvastatin (CRESTOR) 10 MG tablet Take 1 tablet (10 mg total) by mouth daily.  Marland Kitchen spironolactone (ALDACTONE) 50 MG tablet Take 1 tablet (50 mg total) by mouth daily.  Marland Kitchen spironolactone (ALDACTONE) 50 MG tablet TAKE 1 TABLET BY MOUTH TWICE A DAY  . fexofenadine (ALLEGRA) 180 MG tablet Take 1 tablet (180 mg total) by mouth daily.   Current Facility-Administered Medications (Other)  Medication Route  . Bevacizumab (AVASTIN) SOLN 1.25 mg Intravitreal  . Bevacizumab (AVASTIN) SOLN 1.25 mg Intravitreal      REVIEW OF SYSTEMS: ROS    Positive for: Eyes   Negative for: Constitutional, Gastrointestinal, Neurological, Skin, Genitourinary, Musculoskeletal, HENT, Endocrine, Cardiovascular, Respiratory, Psychiatric, Allergic/Imm, Heme/Lymph   Last edited by Zenovia Jordan, LPN on 38/17/7116 57:90 AM. (History)       ALLERGIES No Known Allergies  PAST MEDICAL HISTORY Past Medical History:  Diagnosis Date  . Allergic rhinitis   . Anxiety   . Aortic valve regurgitation 10/12/2010  . CKD (chronic kidney disease) stage 3, GFR 30-59 ml/min (HCC) 09/26/2017  . COPD (chronic obstructive pulmonary disease) ([place])   . Disc disease, degenerative, cervical   . Diverticulosis of colon   . DJD (degenerative joint disease)    LS spine  .  Gallstones   . History of colonic polyps   . Hyperlipidemia   . Hypertension   . Osteoporosis    Past Surgical History:  Procedure Laterality Date  . ABDOMINAL HYSTERECTOMY    . CATARACT EXTRACTION    . COLONOSCOPY W/ POLYPECTOMY    . EYE SURGERY    . OOPHORECTOMY      FAMILY HISTORY Family History  Problem Relation Age of Onset  . Stroke Unknown        F 1st degree relative  . Parkinsonism Father   . Cancer Unknown        1st degree relative <50  . Coronary artery disease Unknown        F 1st degree relative <60  . Diabetes Unknown        1st degree  relative  . Hypertension Unknown   . Diabetes Mother   . Cancer Mother     SOCIAL HISTORY Social History   Tobacco Use  . Smoking status: Former Research scientist (life sciences)  . Smokeless tobacco: Never Used  Substance Use Topics  . Alcohol use: No    Alcohol/week: 0.0 standard drinks  . Drug use: No         OPHTHALMIC EXAM:  Base Eye Exam    Visual Acuity (Snellen - Linear)      Right Left   Dist [place] 20/50 +2 20/50   Dist ph [place] 20/25 +2 NI       Tonometry (Tonopen, 10:34 AM)      Right Left   Pressure 16 16       Pupils      Dark Light Shape React APD   Right 4 3 Round Brisk None   Left 4 3 Round Brisk None       Visual Fields      Left Right    Full Full       Extraocular Movement      Right Left    Full, Ortho Full, Ortho       Neuro/Psych    Oriented x3:  Yes   Mood/Affect:  Normal       Dilation    Both eyes:  1.0% Mydriacyl, 2.5% Phenylephrine @ 10:34 AM        Slit Lamp and Fundus Exam    Slit Lamp Exam      Right Left   Lids/Lashes Dermatochalasis - upper lid, Meibomian gland dysfunction Dermatochalasis - upper lid, Meibomian gland dysfunction   Conjunctiva/Sclera Melanosis Melanosis   Cornea Arcus, 1+ Punctate epithelial erosions Arcus, 1+ Punctate epithelial erosions   Anterior Chamber Deep and quiet Deep and quiet   Iris Round and dilated, No NVI Round and dilated, No NVI   Lens PC IOL in good position 2+ Nuclear sclerosis, 2+ Cortical cataract   Vitreous Vitreous syneresis Vitreous syneresis       Fundus Exam      Right Left   Disc Pink and Sharp Pink and Sharp   C/D Ratio 0.6 0.6   Macula Blunted foveal reflex, Retinal pigment epithelial mottling, No heme or edema Blunted foveal reflex, regressing RAM superotemporal to fovea with clustered exudates inferiorly. Improving IRF/SRF   Vessels Copper wiring, AV crossing changes, punctate focal area of arterioler sheathing superior macula, severe peripheral attenuation of arterioles Vascular attenuation,  Copper wiring; BRVO improving; RAM ST arcade regressing   Periphery Attached Attached, RPE mottling at 0300 to 0430          IMAGING AND PROCEDURES  Imaging and  Procedures for _0 @  OCT, Retina - OU - Both Eyes       Right Eye Quality was good. Central Foveal Thickness: 246. Progression has been stable. Findings include abnormal foveal contour, no IRF, no SRF, retinal drusen  (Fine central ellipsoid disruptions ).   Left Eye Quality was good. Central Foveal Thickness: 218. Progression has improved. Findings include abnormal foveal contour, intraretinal fluid, intraretinal hyper-reflective material, outer retinal atrophy, no SRF (Interval improvement in IRF/SRF).   Notes *Images captured and stored on drive  Diagnosis / Impression:  OD: No IRF/SRF OS: abnormal foveal contour -- interval improvement in IRF/SRF  Clinical management:  See below  Abbreviations: NFP - Normal foveal profile. CME - cystoid macular edema. PED - pigment epithelial detachment. IRF - intraretinal fluid. SRF - subretinal fluid. EZ - ellipsoid zone. ERM - epiretinal membrane. ORA - outer retinal atrophy. ORT - outer retinal tubulation. SRHM - subretinal hyper-reflective material         Intravitreal Injection, Pharmacologic Agent - OS - Left Eye       Time Out 12/20/2017. 11:59 AM. Confirmed correct patient, procedure, site, and patient consented.   Anesthesia Topical anesthesia was used. Anesthetic medications included Lidocaine 2%, Proparacaine 0.5%.   Procedure Preparation included 5% betadine to ocular surface, eyelid speculum. A 30 gauge needle was used.   Injection:  1.25 mg Bevacizumab 1.70m/0.05ml   NDC: 50242-060-01, Lot: [id number] , Expiration date: 02/09/2018   Route: Intravitreal, Site: Left Eye, Waste: 0 mg  Post-op Post injection exam found visual acuity of at least counting fingers. The patient tolerated the procedure well. There were no complications. The patient  received written and verbal post procedure care education.                 ASSESSMENT/PLAN:    ICD-10-CM   1. 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 Branch retinal vein occlusion of left eye with macular edema H34.8320 Intravitreal Injection, Pharmacologic Agent - OS - Left Eye    Bevacizumab (AVASTIN) SOLN 1.25 mg  3. Retinal edema H35.81 OCT, Retina - OU - Both Eyes  4. Essential hypertension I10   5. Hypertensive retinopathy of both eyes H35.033   6. Combined forms of age-related cataract of left eye H25.812   7. Pseudophakia Z96.1     1-3. Retinal arterial macroaneurysm and BRVO w/ edema OS - at initial presentation 9.24.19, OCT macula showed +IRF/SRF - FA (09.24.19) shows blockage  - S/P IVA OS #1 (09.24.19) - today BCVA remains 20/50, but OCT with interval improvement in IRF/SRF - recommend IVA OS # 2 today (10.22.19) - pt wishes to proceed - RBA of procedure discussed, questions answered -- daughter present today - informed consent obtained and signed - see procedure note - F/U 4 weeks  4,5. Hypertensive retinopathy OU - discussed importance of tight BP control - monitor  6. Combined form age related cataract OS-  - The symptoms of cataract, surgical options, and treatments and risks were discussed with patient. - discussed diagnosis and progression - not yet visually significant - monitor for now  7. Pseudophakia OD  - s/p CE/IOL OD  - doing well  - monitor   Ophthalmic Meds Ordered this visit:  Meds ordered this encounter  Medications  . Bevacizumab (AVASTIN) SOLN 1.25 mg       Return in  about 4 weeks (around 01/17/2018) for EXU, DFE, OCT.  There are no Patient Instructions on file for this visit.   Explained the diagnoses, plan, and follow up with the patient and they expressed understanding.  Patient  expressed understanding of the importance of proper follow up care.   This document serves as a record of services personally performed by Gardiner Sleeper, MD, PhD. It was created on their behalf by Ernest Mallick, OA, an ophthalmic assistant. The creation of this record is the provider's dictation and/or activities during the visit.    Electronically signed by: Ernest Mallick, OA  10.21.19 12:57 PM     Gardiner Sleeper, M.D., Ph.D. Diseases & Surgery of the Retina and Vitreous [facility]   I have reviewed the above documentation for accuracy and completeness, and I agree with the above. Gardiner Sleeper, M.D., Ph.D. 12/20/17 12:59 PM    Abbreviations: M myopia (nearsighted); A astigmatism; H hyperopia (farsighted); P presbyopia; Mrx spectacle prescription;  CTL contact lenses; OD right eye; OS left eye; OU both eyes  XT exotropia; ET esotropia; PEK punctate epithelial keratitis; PEE punctate epithelial erosions; DES dry eye syndrome; MGD meibomian gland dysfunction; ATs artificial tears; PFAT's preservative free artificial tears; [place] nuclear sclerotic cataract; PSC posterior subcapsular cataract; ERM epi-retinal membrane; PVD posterior vitreous detachment; RD retinal detachment; DM diabetes mellitus; DR diabetic retinopathy; NPDR non-proliferative diabetic retinopathy; PDR proliferative diabetic retinopathy; CSME clinically significant macular edema; DME diabetic macular edema; dbh dot blot hemorrhages; CWS cotton wool spot; POAG primary open angle glaucoma; C/D cup-to-disc ratio; HVF humphrey visual field; GVF goldmann visual field; OCT optical coherence tomography; IOP intraocular pressure; BRVO Branch retinal vein occlusion; CRVO central retinal vein occlusion; CRAO central retinal artery occlusion; BRAO branch retinal artery occlusion; RT retinal tear; SB scleral buckle; PPV pars plana vitrectomy; VH Vitreous hemorrhage; PRP panretinal laser photocoagulation; IVK intravitreal  kenalog; VMT vitreomacular traction; MH Macular hole;  NVD neovascularization of the disc; NVE neovascularization elsewhere; AREDS age related eye disease study; ARMD age related macular degeneration; POAG primary open angle glaucoma; EBMD epithelial/anterior basement membrane dystrophy; ACIOL anterior chamber intraocular lens; IOL intraocular lens; PCIOL posterior chamber intraocular lens; Phaco/IOL phacoemulsification with intraocular lens placement; [place] photorefractive keratectomy; LASIK laser assisted in situ keratomileusis; HTN hypertension; DM diabetes mellitus; COPD chronic obstructive pulmonary disease

## 2017-12-20 ENCOUNTER — Ambulatory Visit (INDEPENDENT_AMBULATORY_CARE_PROVIDER_SITE_OTHER): Payer: Medicare HMO | Admitting: Ophthalmology

## 2017-12-20 ENCOUNTER — Encounter (INDEPENDENT_AMBULATORY_CARE_PROVIDER_SITE_OTHER): Payer: Self-pay | Admitting: Ophthalmology

## 2017-12-20 DIAGNOSIS — H3581 Retinal edema: Secondary | ICD-10-CM | POA: Diagnosis not present

## 2017-12-20 DIAGNOSIS — I10 Essential (primary) hypertension: Secondary | ICD-10-CM

## 2017-12-20 DIAGNOSIS — H34832 Tributary (branch) retinal vein occlusion, left eye, with macular edema: Secondary | ICD-10-CM | POA: Diagnosis not present

## 2017-12-20 DIAGNOSIS — H25812 Combined forms of age-related cataract, left eye: Secondary | ICD-10-CM

## 2017-12-20 DIAGNOSIS — H35012 Changes in retinal vascular appearance, left eye: Secondary | ICD-10-CM

## 2017-12-20 DIAGNOSIS — H35033 Hypertensive retinopathy, bilateral: Secondary | ICD-10-CM

## 2017-12-20 DIAGNOSIS — Z961 Presence of intraocular lens: Secondary | ICD-10-CM

## 2017-12-20 MED ORDER — BEVACIZUMAB CHEMO INJECTION 1.25MG/0.05ML SYRINGE FOR KALEIDOSCOPE
1.2500 mg | INTRAVITREAL | Status: DC
  Administered 2017-12-20: 1.25 mg via INTRAVITREAL

## 2017-12-27 ENCOUNTER — Ambulatory Visit (INDEPENDENT_AMBULATORY_CARE_PROVIDER_SITE_OTHER): Payer: Medicare HMO

## 2017-12-27 DIAGNOSIS — Z23 Encounter for immunization: Secondary | ICD-10-CM

## 2018-01-18 ENCOUNTER — Encounter (INDEPENDENT_AMBULATORY_CARE_PROVIDER_SITE_OTHER): Payer: Medicare HMO | Admitting: Ophthalmology

## 2018-01-20 NOTE — Progress Notes (Addendum)
[facility] Note  01/23/2018     CHIEF COMPLAINT Patient presents for Retina Follow Up   HISTORY OF PRESENT ILLNESS: Megan Baldwin is a 84 y.o. female who presents to the clinic today for:   HPI    Retina Follow Up    Patient presents with  Other (macroaneurysm OS).  In left eye.  Severity is moderate.  Duration of 4 weeks.  Since onset it is stable.  I, the attending physician,  performed the HPI with the patient and updated documentation appropriately.          Comments    Patient states vision the same OU. Patient states BP was 162/81 this am before taking meds.        Last edited by [name], MD on 01/23/2018 10:03 AM. (History)    pt states she feels like the injections are helping her left eye, but she states she does not read as much, she states her left eye hurts occasionally also  Referring physician: Biagio Borg, MD [place], [place] [id number]  HISTORICAL INFORMATION:   Selected notes from the MEDICAL RECORD NUMBER Referred by Dr. [name] for concern of retinal hemorrhage OS with +RAM sup temp LEE: 09.19.19 ([name]) [BCVA: OD: 20/ OS: 20/60-2] Ocular Hx-cataract OS, PCO OD, HTN ret OU, pseudo OD PMH-HTN, anxiety, CKD, COPD, HLD    CURRENT MEDICATIONS: No current outpatient medications on file. (Ophthalmic Drugs)   No current facility-administered medications for this visit.  (Ophthalmic Drugs)   Current Outpatient Medications (Other)  Medication Sig  . albuterol (PROVENTIL HFA;VENTOLIN HFA) 108 (90 Base) MCG/ACT inhaler Inhale 2 puffs into the lungs every 6 (six) hours as needed for wheezing or shortness of breath.  Marland Kitchen aspirin 325 MG EC tablet Take 325 mg by mouth daily.    . diazepam (VALIUM) 5 MG tablet TAKE 1 TABLET BY MOUTH TWICE A DAY AS NEEDED  . diclofenac sodium (VOLTAREN) 1 % GEL Apply 1 application topically 4 (four) times daily.  Marland Kitchen labetalol (NORMODYNE) 200 MG tablet TAKE 1 TABLET BY  MOUTH TWICE A DAY  . rosuvastatin (CRESTOR) 10 MG tablet Take 1 tablet (10 mg total) by mouth daily.  Marland Kitchen spironolactone (ALDACTONE) 50 MG tablet Take 1 tablet (50 mg total) by mouth daily.  Marland Kitchen spironolactone (ALDACTONE) 50 MG tablet TAKE 1 TABLET BY MOUTH TWICE A DAY  . fexofenadine (ALLEGRA) 180 MG tablet Take 1 tablet (180 mg total) by mouth daily.   Current Facility-Administered Medications (Other)  Medication Route  . Bevacizumab (AVASTIN) SOLN 1.25 mg Intravitreal  . Bevacizumab (AVASTIN) SOLN 1.25 mg Intravitreal  . Bevacizumab (AVASTIN) SOLN 1.25 mg Intravitreal      REVIEW OF SYSTEMS: ROS    Positive for: Eyes   Negative for: Constitutional, Gastrointestinal, Neurological, Skin, Genitourinary, Musculoskeletal, HENT, Endocrine, Cardiovascular, Respiratory, Psychiatric, Allergic/Imm, Heme/Lymph   Last edited by Roselee Nova D on 01/23/2018  9:42 AM. (History)       ALLERGIES No Known Allergies  PAST MEDICAL HISTORY Past Medical History:  Diagnosis Date  . Allergic rhinitis   . Anxiety   . Aortic valve regurgitation 10/12/2010  . CKD (chronic kidney disease) stage 3, GFR 30-59 ml/min (HCC) 09/26/2017  . COPD (chronic obstructive pulmonary disease) ([place])   . Disc disease, degenerative, cervical   . Diverticulosis of colon   . DJD (degenerative joint disease)    LS spine  . Gallstones   . History  of colonic polyps   . Hyperlipidemia   . Hypertension   . Osteoporosis    Past Surgical History:  Procedure Laterality Date  . ABDOMINAL HYSTERECTOMY    . CATARACT EXTRACTION    . COLONOSCOPY W/ POLYPECTOMY    . EYE SURGERY    . OOPHORECTOMY      FAMILY HISTORY Family History  Problem Relation Age of Onset  . Stroke Unknown        F 1st degree relative  . Parkinsonism Father   . Cancer Unknown        1st degree relative <50  . Coronary artery disease Unknown        F 1st degree relative <60  . Diabetes Unknown        1st degree relative  . Hypertension  Unknown   . Diabetes Mother   . Cancer Mother     SOCIAL HISTORY Social History   Tobacco Use  . Smoking status: Former Research scientist (life sciences)  . Smokeless tobacco: Never Used  Substance Use Topics  . Alcohol use: No    Alcohol/week: 0.0 standard drinks  . Drug use: No         OPHTHALMIC EXAM:  Base Eye Exam    Visual Acuity (Snellen - Linear)      Right Left   Dist [place] 20/30 20/40   Dist ph [place] 20/25 20/40 +2       Tonometry (Tonopen, 9:55 AM)      Right Left   Pressure 15 18       Pupils      Dark Light Shape React APD   Right 4 3 Round Slow None   Left 4 3 Round Slow None       Visual Fields (Counting fingers)      Left Right    Full Full       Extraocular Movement      Right Left    Full, Ortho Full, Ortho       Neuro/Psych    Oriented x3:  Yes   Mood/Affect:  Normal       Dilation    Both eyes:  1.0% Mydriacyl, 2.5% Phenylephrine @ 9:55 AM        Slit Lamp and Fundus Exam    Slit Lamp Exam      Right Left   Lids/Lashes Dermatochalasis - upper lid, Meibomian gland dysfunction Dermatochalasis - upper lid, Meibomian gland dysfunction   Conjunctiva/Sclera Melanosis Melanosis   Cornea Arcus, 1+ Punctate epithelial erosions Arcus, 1+ Punctate epithelial erosions   Anterior Chamber Deep and quiet Deep and quiet   Iris Round and dilated, No NVI Round and dilated, No NVI   Lens PC IOL in good position 2+ Nuclear sclerosis, 2+ Cortical cataract   Vitreous Vitreous syneresis Vitreous syneresis       Fundus Exam      Right Left   Disc Pink and Sharp Pink and Sharp   C/D Ratio 0.6 0.6   Macula Blunted foveal reflex, Retinal pigment epithelial mottling, No heme or edema Blunted foveal reflex, regressing RAM superotemporal to fovea with clustered exudates inferiorly. Improving IRF/SRF   Vessels Copper wiring, AV crossing changes, punctate focal area of arterioler sheathing superior macula, severe peripheral attenuation of arterioles Vascular attenuation, Copper  wiring; BRVO improving; RAM ST arcade regressing   Periphery Attached Attached, RPE mottling at 0300 to 0430          IMAGING AND PROCEDURES  Imaging and Procedures for _0 @  OCT, Retina - OU - Both Eyes       Right Eye Quality was good. Central Foveal Thickness: 204. Progression has been stable. Findings include abnormal foveal contour, no IRF, no SRF, retinal drusen .   Left Eye Quality was good. Central Foveal Thickness: 216. Progression has improved. Findings include abnormal foveal contour, intraretinal fluid, intraretinal hyper-reflective material, outer retinal atrophy, no SRF (Trace persistent IRF).   Notes *Images captured and stored on drive  Diagnosis / Impression:  OD: No IRF/SRF OS: abnormal foveal contour -- trace persistent IRF  Clinical management:  See below  Abbreviations: NFP - Normal foveal profile. CME - cystoid macular edema. PED - pigment epithelial detachment. IRF - intraretinal fluid. SRF - subretinal fluid. EZ - ellipsoid zone. ERM - epiretinal membrane. ORA - outer retinal atrophy. ORT - outer retinal tubulation. SRHM - subretinal hyper-reflective material         Intravitreal Injection, Pharmacologic Agent - OS - Left Eye       Time Out 01/23/2018. 10:40 AM. Confirmed correct patient, procedure, site, and patient consented.   Anesthesia Topical anesthesia was used. Anesthetic medications included Lidocaine 2%, Proparacaine 0.5%.   Procedure Preparation included 5% betadine to ocular surface, eyelid speculum. A 30 gauge needle was used.   Injection:  1.25 mg Bevacizumab 1.36m/0.05ml   NDC: 50242-060-01, Lot: [id number] , Expiration date: 03/30/2018   Route: Intravitreal, Site: Left Eye, Waste: 0 mL  Post-op Post injection exam found visual acuity of at least counting fingers. The patient tolerated the procedure well. There were no complications. The patient received written and verbal post procedure care education.                  ASSESSMENT/PLAN:    ICD-10-CM   1. 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 Branch retinal vein occlusion of left eye with macular edema H34.8320 Intravitreal Injection, Pharmacologic Agent - OS - Left Eye    Bevacizumab (AVASTIN) SOLN 1.25 mg  3. Retinal edema H35.81 OCT, Retina - OU - Both Eyes  4. Essential hypertension I10   5. Hypertensive retinopathy of both eyes H35.033   6. Combined forms of age-related cataract of left eye H25.812   7. Pseudophakia Z96.1     1-3. Retinal arterial macroaneurysm and BRVO w/ edema OS - at initial presentation 9.24.19, OCT macula showed +IRF/SRF - FA (09.24.19) shows blockage  - S/P IVA OS #1 (09.24.19), #2 (10.22.19) - today BCVA improved to 20/40+2,  - OCT with mild persistent IRF -- SRF resolved - recommend IVA OS # 3 today (11.25.19) - pt wishes to proceed - RBA of procedure discussed, questions answered -- [place] informed consent obtained and signed - see procedure note - F/U 4 weeks, OCT, DFE, repeat FA (Optos, transit OS)  4,5. Hypertensive retinopathy OU - discussed importance of tight BP control - monitor  6. Combined form age related cataract OS-  - The symptoms of cataract, surgical options, and treatments and risks were discussed with patient. - discussed diagnosis and progression - not yet visually significant - monitor for now  7. Pseudophakia OD  - s/p CE/IOL OD  - doing well  - monitor   Ophthalmic Meds Ordered this visit:  Meds ordered this encounter  Medications  . Bevacizumab (AVASTIN) SOLN 1.25 mg       Return in about 4 weeks (around 02/20/2018) for  BRVO OS, DFE, OCT, FA (OPTOS, transit OS).  There are no Patient Instructions on file for this visit.   Explained the diagnoses, plan, and follow up with the patient and they expressed understanding.  Patient  expressed understanding of the importance of proper follow up care.   This document serves as a record of services personally performed by Gardiner Sleeper, MD, PhD. It was created on their behalf by Ernest Mallick, OA, an ophthalmic assistant. The creation of this record is the provider's dictation and/or activities during the visit.    Electronically signed by: Ernest Mallick, OA  11.22.19 1:28 PM    Gardiner Sleeper, M.D., Ph.D. Diseases & Surgery of the Retina and Vitreous [facility]   I have reviewed the above documentation for accuracy and completeness, and I agree with the above. Gardiner Sleeper, M.D., Ph.D. 01/23/18 1:28 PM    Abbreviations: M myopia (nearsighted); A astigmatism; H hyperopia (farsighted); P presbyopia; Mrx spectacle prescription;  CTL contact lenses; OD right eye; OS left eye; OU both eyes  XT exotropia; ET esotropia; PEK punctate epithelial keratitis; PEE punctate epithelial erosions; DES dry eye syndrome; MGD meibomian gland dysfunction; ATs artificial tears; PFAT's preservative free artificial tears; [place] nuclear sclerotic cataract; PSC posterior subcapsular cataract; ERM epi-retinal membrane; PVD posterior vitreous detachment; RD retinal detachment; DM diabetes mellitus; DR diabetic retinopathy; NPDR non-proliferative diabetic retinopathy; PDR proliferative diabetic retinopathy; CSME clinically significant macular edema; DME diabetic macular edema; dbh dot blot hemorrhages; CWS cotton wool spot; POAG primary open angle glaucoma; C/D cup-to-disc ratio; HVF humphrey visual field; GVF goldmann visual field; OCT optical coherence tomography; IOP intraocular pressure; BRVO Branch retinal vein occlusion; CRVO central retinal vein occlusion; CRAO central retinal artery occlusion; BRAO branch retinal artery occlusion; RT retinal tear; SB scleral buckle; PPV pars plana vitrectomy; VH Vitreous hemorrhage; PRP panretinal laser photocoagulation; IVK intravitreal  kenalog; VMT vitreomacular traction; MH Macular hole;  NVD neovascularization of the disc; NVE neovascularization elsewhere; AREDS age related eye disease study; ARMD age related macular degeneration; POAG primary open angle glaucoma; EBMD epithelial/anterior basement membrane dystrophy; ACIOL anterior chamber intraocular lens; IOL intraocular lens; PCIOL posterior chamber intraocular lens; Phaco/IOL phacoemulsification with intraocular lens placement; [place] photorefractive keratectomy; LASIK laser assisted in situ keratomileusis; HTN hypertension; DM diabetes mellitus; COPD chronic obstructive pulmonary disease

## 2018-01-23 ENCOUNTER — Ambulatory Visit (INDEPENDENT_AMBULATORY_CARE_PROVIDER_SITE_OTHER): Payer: Medicare HMO | Admitting: Ophthalmology

## 2018-01-23 ENCOUNTER — Encounter (INDEPENDENT_AMBULATORY_CARE_PROVIDER_SITE_OTHER): Payer: Self-pay | Admitting: Ophthalmology

## 2018-01-23 DIAGNOSIS — H35012 Changes in retinal vascular appearance, left eye: Secondary | ICD-10-CM | POA: Diagnosis not present

## 2018-01-23 DIAGNOSIS — H34832 Tributary (branch) retinal vein occlusion, left eye, with macular edema: Secondary | ICD-10-CM

## 2018-01-23 DIAGNOSIS — H3581 Retinal edema: Secondary | ICD-10-CM

## 2018-01-23 DIAGNOSIS — Z961 Presence of intraocular lens: Secondary | ICD-10-CM

## 2018-01-23 DIAGNOSIS — H25812 Combined forms of age-related cataract, left eye: Secondary | ICD-10-CM

## 2018-01-23 DIAGNOSIS — H35033 Hypertensive retinopathy, bilateral: Secondary | ICD-10-CM

## 2018-01-23 DIAGNOSIS — I10 Essential (primary) hypertension: Secondary | ICD-10-CM

## 2018-01-23 MED ORDER — BEVACIZUMAB CHEMO INJECTION 1.25MG/0.05ML SYRINGE FOR KALEIDOSCOPE
1.2500 mg | INTRAVITREAL | Status: DC
  Administered 2018-01-23: 1.25 mg via INTRAVITREAL

## 2018-02-08 ENCOUNTER — Other Ambulatory Visit: Payer: Self-pay | Admitting: Internal Medicine

## 2018-02-08 NOTE — Telephone Encounter (Signed)
Done erx 

## 2018-02-16 NOTE — Progress Notes (Signed)
[facility] Note  02/20/2018     CHIEF COMPLAINT Patient presents for Retina Follow Up   HISTORY OF PRESENT ILLNESS: Megan Baldwin is a 84 y.o. female who presents to the clinic today for:   HPI    Retina Follow Up    Patient presents with  CRVO/BRVO.  In left eye.  This started months ago.  Severity is moderate.  Duration of 4 weeks.  Since onset it is stable.  I, the attending physician,  performed the HPI with the patient and updated documentation appropriately.          Comments    84 y/o female pt here for 4 wk f/u for BRVO w/mac edema and retinal arterial MA OS.  No change in [place] OU.  Denies flashes, but has a few small floaters OU.  Occasionally has minor transient pain OS.  No gtts.       Last edited by Matthew Folks, COA on 02/20/2018  1:29 PM. (History)    pt states she hopes the shots are helping her eye, she states every once in awhile she has a pain in her eye that feels like pressure, she states her blood pressure has not been that good over the past couple of months, but she has been checking it regularly, she states it has been running around 170/100-something, she states she has been taking her medication on a regular basis, she states she occasionally takes an extra dose, and she takes a valium occasionally to calm herself down  Referring physician: [name], [address] [place], [place] [address]  HISTORICAL INFORMATION:   Selected notes from the MEDICAL RECORD NUMBER Referred by Dr. [name] for concern of retinal hemorrhage OS with +RAM sup temp LEE: 09.19.19 ([name]) [BCVA: OD: 20/ OS: 20/60-2] Ocular Hx-cataract OS, PCO OD, HTN ret OU, pseudo OD PMH-HTN, anxiety, CKD, COPD, HLD    CURRENT MEDICATIONS: No current outpatient medications on file. (Ophthalmic Drugs)   No current facility-administered medications for this visit.  (Ophthalmic Drugs)   Current Outpatient Medications (Other)   Medication Sig  . albuterol (PROVENTIL HFA;VENTOLIN HFA) 108 (90 Base) MCG/ACT inhaler Inhale 2 puffs into the lungs every 6 (six) hours as needed for wheezing or shortness of breath.  Marland Kitchen aspirin 325 MG EC tablet Take 325 mg by mouth daily.    . diazepam (VALIUM) 5 MG tablet TAKE 1 TABLET BY MOUTH TWICE A DAY AS NEEDED  . diclofenac sodium (VOLTAREN) 1 % GEL Apply 1 application topically 4 (four) times daily.  . fexofenadine (ALLEGRA) 180 MG tablet Take 1 tablet (180 mg total) by mouth daily.  Marland Kitchen labetalol (NORMODYNE) 200 MG tablet TAKE 1 TABLET BY MOUTH TWICE A DAY  . rosuvastatin (CRESTOR) 10 MG tablet Take 1 tablet (10 mg total) by mouth daily.  Marland Kitchen spironolactone (ALDACTONE) 50 MG tablet Take 1 tablet (50 mg total) by mouth daily.  Marland Kitchen spironolactone (ALDACTONE) 50 MG tablet TAKE 1 TABLET BY MOUTH TWICE A DAY   Current Facility-Administered Medications (Other)  Medication Route  . Bevacizumab (AVASTIN) SOLN 1.25 mg Intravitreal  . Bevacizumab (AVASTIN) SOLN 1.25 mg Intravitreal  . Bevacizumab (AVASTIN) SOLN 1.25 mg Intravitreal  . Bevacizumab (AVASTIN) SOLN 1.25 mg Intravitreal      REVIEW OF SYSTEMS: ROS    Positive for: Cardiovascular, Eyes   Negative for: Constitutional, Gastrointestinal, Neurological, Skin, Genitourinary, Musculoskeletal, HENT, Endocrine, Respiratory, Psychiatric, Allergic/Imm, Heme/Lymph   Last edited  by Matthew Folks, COA on 02/20/2018  1:27 PM. (History)       ALLERGIES No Known Allergies  PAST MEDICAL HISTORY Past Medical History:  Diagnosis Date  . Allergic rhinitis   . Anxiety   . Aortic valve regurgitation 10/12/2010  . CKD (chronic kidney disease) stage 3, GFR 30-59 ml/min (HCC) 09/26/2017  . COPD (chronic obstructive pulmonary disease) ([place])   . Disc disease, degenerative, cervical   . Diverticulosis of colon   . DJD (degenerative joint disease)    LS spine  . Gallstones   . History of colonic polyps   . Hyperlipidemia   . Hypertension    . Osteoporosis    Past Surgical History:  Procedure Laterality Date  . ABDOMINAL HYSTERECTOMY    . CATARACT EXTRACTION    . COLONOSCOPY W/ POLYPECTOMY    . EYE SURGERY    . OOPHORECTOMY      FAMILY HISTORY Family History  Problem Relation Age of Onset  . Stroke Other        F 1st degree relative  . Parkinsonism Father   . Cancer Other        1st degree relative <50  . Coronary artery disease Other        F 1st degree relative <60  . Diabetes Other        1st degree relative  . Hypertension Other   . Diabetes Mother   . Cancer Mother     SOCIAL HISTORY Social History   Tobacco Use  . Smoking status: Former Research scientist (life sciences)  . Smokeless tobacco: Never Used  Substance Use Topics  . Alcohol use: No    Alcohol/week: 0.0 standard drinks  . Drug use: No         OPHTHALMIC EXAM:  Base Eye Exam    Visual Acuity (Snellen - Linear)      Right Left   Dist cc 20/40 +2 20/30 +2   Dist ph cc 20/30 +2 NI       Tonometry (Tonopen, 1:30 PM)      Right Left   Pressure 20 22  Squeezing       Pupils      Dark Light Shape React APD   Right 4 3 Round Slow None   Left 4 3 Round Slow None       Visual Fields (Counting fingers)      Left Right    Full Full       Extraocular Movement      Right Left    Full, Ortho Full, Ortho       Neuro/Psych    Oriented x3:  Yes   Mood/Affect:  Normal       Dilation    Both eyes:  1.0% Mydriacyl, 2.5% Phenylephrine @ 1:30 PM        Slit Lamp and Fundus Exam    Slit Lamp Exam      Right Left   Lids/Lashes Dermatochalasis - upper lid, Meibomian gland dysfunction Dermatochalasis - upper lid, Meibomian gland dysfunction   Conjunctiva/Sclera Melanosis Melanosis   Cornea Arcus, 1+ Punctate epithelial erosions Arcus, 1+ Punctate epithelial erosions   Anterior Chamber Deep and quiet Deep and quiet   Iris Round and dilated, No NVI Round and dilated, No NVI   Lens PC IOL in good position 2+ Nuclear sclerosis, 2+ Cortical cataract    Vitreous Vitreous syneresis Vitreous syneresis       Fundus Exam      Right Left  Disc Pink and Sharp Pink and Sharp   C/D Ratio 0.6 0.6   Macula Blunted foveal reflex, Retinal pigment epithelial mottling, No heme or edema Blunted foveal reflex, regressing RAM superotemporal to fovea with clustered exudates inferiorly--improving. Improving IRF/SRF   Vessels Copper wiring, AV crossing changes, punctate focal area of arterioler sheathing superior macula, severe peripheral attenuation of arterioles Vascular attenuation, Copper wiring; BRVO improving; RAM ST arcade regressing   Periphery Attached Attached, RPE mottling at 0300 to 0430          IMAGING AND PROCEDURES  Imaging and Procedures for @TODAY @  OCT, Retina - OU - Both Eyes       Right Eye Quality was good. Central Foveal Thickness: 200. Progression has been stable. Findings include abnormal foveal contour, no IRF, no SRF, retinal drusen .   Left Eye Quality was good. Central Foveal Thickness: 213. Progression has improved. Findings include abnormal foveal contour, intraretinal fluid, intraretinal hyper-reflective material, outer retinal atrophy, no SRF (persistent IRF; interval improvement in [facility]).   Notes *Images captured and stored on drive  Diagnosis / Impression:  OD: No IRF/SRF OS: abnormal foveal contour -- trace persistent IRF; interval improvement in [facility]  Clinical management:  See below  Abbreviations: NFP - Normal foveal profile. CME - cystoid macular edema. PED - pigment epithelial detachment. IRF - intraretinal fluid. SRF - subretinal fluid. EZ - ellipsoid zone. ERM - epiretinal membrane. ORA - outer retinal atrophy. ORT - outer retinal tubulation. SRHM - subretinal hyper-reflective material         Fluorescein Angiography Optos (Transit OS)       Right Eye   Progression has been stable. Early phase findings include staining, microaneurysm (Rare MA's early/late). Mid/Late phase findings include  staining, microaneurysm.   Left Eye   Progression has improved. Early phase findings include blockage, vascular perfusion defect. Mid/Late phase findings include blockage, microaneurysm, staining (Rare MA's).   Notes Images captured and stored on drive;   Impression: OD - rare MA's; staining OS - minimal leakage         Intravitreal Injection, Pharmacologic Agent - OS - Left Eye       Time Out 02/20/2018. 2:46 PM. Confirmed correct patient, procedure, site, and patient consented.   Anesthesia Topical anesthesia was used. Anesthetic medications included Lidocaine 2%, Proparacaine 0.5%.   Procedure Preparation included 5% betadine to ocular surface, eyelid speculum. A 30 gauge needle was used.   Injection:  1.25 mg Bevacizumab (AVASTIN) SOLN   NDC: 34196-222-97, Lot: [id number] , Expiration date: 04/21/2018   Route: Intravitreal, Site: Left Eye, Waste: 0 mL  Post-op Post injection exam found visual acuity of at least counting fingers. The patient tolerated the procedure well. There were no complications. The patient received written and verbal post procedure care education.                 ASSESSMENT/PLAN:    ICD-10-CM   1. 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 Branch retinal vein occlusion of left eye with macular edema 05/04/2018 Fluorescein Angiography Optos (Transit OS)    Intravitreal Injection, Pharmacologic Agent - OS - Left Eye    Bevacizumab (AVASTIN) SOLN 1.25 mg  3. Retinal edema H35.81 OCT, Retina - OU - Both Eyes  4. Essential hypertension I10   5. Hypertensive retinopathy of both eyes H35.033 Fluorescein Angiography Optos (Transit  OS)  6. Combined forms of age-related cataract of left eye H25.812   7. Pseudophakia Z96.1     1-3. Retinal arterial macroaneurysm and BRVO w/ edema OS - at initial  presentation 9.24.19, OCT macula showed +IRF/SRF - FA (09.24.19) shows blockage  - S/P IVA OS #1 (09.24.19), #2 (10.22.19), #3 (11.25.19) - today BCVA improved to 20/30+2 - OCT with mild persistent IRF -- SRF resolved - recommend IVA OS #4 today (12.23.19) - pt wishes to proceed - RBA of procedure discussed, questions answered -- [place] [facility] [place] present today - informed consent obtained and signed - see procedure note - F/U 4-5 weeks, OCT, DFE  4,5. Hypertensive retinopathy OU - discussed importance of tight BP control - monitor  6. Combined form age related cataract OS-  - The symptoms of cataract, surgical options, and treatments and risks were discussed with patient. - discussed diagnosis and progression - not yet visually significant - monitor for now  7. Pseudophakia OD  - s/p CE/IOL OD  - doing well  - monitor   Ophthalmic Meds Ordered this visit:  Meds ordered this encounter  Medications  . Bevacizumab (AVASTIN) SOLN 1.25 mg       Return for F/U 4-5 weeks BRVO, DFE, OCT.  There are no Patient Instructions on file for this visit.   Explained the diagnoses, plan, and follow up with the patient and they expressed understanding.  Patient expressed understanding of the importance of proper follow up care.   This document serves as a record of services personally performed by Gardiner Sleeper, MD, PhD. It was created on their behalf by Ernest Mallick, OA, an ophthalmic assistant. The creation of this record is the provider's dictation and/or activities during the visit.    Electronically signed by: Ernest Mallick, OA  12.19.19 2:58 PM    Gardiner Sleeper, M.D., Ph.D. Diseases & Surgery of the Retina and Vitreous [facility]  I have reviewed the above documentation for accuracy and completeness, and I agree with the above. Gardiner Sleeper, M.D., Ph.D. 02/20/18 2:58 PM    Abbreviations: M myopia (nearsighted); A astigmatism; H  hyperopia (farsighted); P presbyopia; Mrx spectacle prescription;  CTL contact lenses; OD right eye; OS left eye; OU both eyes  XT exotropia; ET esotropia; PEK punctate epithelial keratitis; PEE punctate epithelial erosions; DES dry eye syndrome; MGD meibomian gland dysfunction; ATs artificial tears; PFAT's preservative free artificial tears; [place] nuclear sclerotic cataract; PSC posterior subcapsular cataract; ERM epi-retinal membrane; PVD posterior vitreous detachment; RD retinal detachment; DM diabetes mellitus; DR diabetic retinopathy; NPDR non-proliferative diabetic retinopathy; PDR proliferative diabetic retinopathy; CSME clinically significant macular edema; DME diabetic macular edema; dbh dot blot hemorrhages; CWS cotton wool spot; POAG primary open angle glaucoma; C/D cup-to-disc ratio; HVF humphrey visual field; GVF goldmann visual field; OCT optical coherence tomography; IOP intraocular pressure; BRVO Branch retinal vein occlusion; CRVO central retinal vein occlusion; CRAO central retinal artery occlusion; BRAO branch retinal artery occlusion; RT retinal tear; SB scleral buckle; PPV pars plana vitrectomy; VH Vitreous hemorrhage; PRP panretinal laser photocoagulation; IVK intravitreal kenalog; VMT vitreomacular traction; MH Macular hole;  NVD neovascularization of the disc; NVE neovascularization elsewhere; AREDS age related eye disease study; ARMD age related macular degeneration; POAG primary open angle glaucoma; EBMD epithelial/anterior basement membrane dystrophy; ACIOL anterior chamber intraocular lens; IOL intraocular lens; PCIOL posterior chamber intraocular lens; Phaco/IOL phacoemulsification with intraocular lens placement; PRK photorefractive keratectomy; LASIK laser assisted in situ keratomileusis; HTN hypertension;  DM diabetes mellitus; COPD chronic obstructive pulmonary disease

## 2018-02-20 ENCOUNTER — Ambulatory Visit (INDEPENDENT_AMBULATORY_CARE_PROVIDER_SITE_OTHER): Payer: Medicare HMO | Admitting: Ophthalmology

## 2018-02-20 ENCOUNTER — Encounter (INDEPENDENT_AMBULATORY_CARE_PROVIDER_SITE_OTHER): Payer: Self-pay | Admitting: Ophthalmology

## 2018-02-20 DIAGNOSIS — H3581 Retinal edema: Secondary | ICD-10-CM

## 2018-02-20 DIAGNOSIS — H35012 Changes in retinal vascular appearance, left eye: Secondary | ICD-10-CM

## 2018-02-20 DIAGNOSIS — H35033 Hypertensive retinopathy, bilateral: Secondary | ICD-10-CM | POA: Diagnosis not present

## 2018-02-20 DIAGNOSIS — H34832 Tributary (branch) retinal vein occlusion, left eye, with macular edema: Secondary | ICD-10-CM

## 2018-02-20 DIAGNOSIS — H25812 Combined forms of age-related cataract, left eye: Secondary | ICD-10-CM

## 2018-02-20 DIAGNOSIS — I10 Essential (primary) hypertension: Secondary | ICD-10-CM

## 2018-02-20 DIAGNOSIS — Z961 Presence of intraocular lens: Secondary | ICD-10-CM | POA: Diagnosis not present

## 2018-02-20 MED ORDER — BEVACIZUMAB CHEMO INJECTION 1.25MG/0.05ML SYRINGE FOR KALEIDOSCOPE
1.2500 mg | INTRAVITREAL | Status: DC
  Administered 2018-02-20: 1.25 mg via INTRAVITREAL

## 2018-03-23 NOTE — Progress Notes (Signed)
[facility] Note  03/24/2018     CHIEF COMPLAINT Patient presents for Retina Follow Up   HISTORY OF PRESENT ILLNESS: Megan Baldwin is a 85 y.o. female who presents to the clinic today for:   HPI    Retina Follow Up    Patient presents with  CRVO/BRVO.  In left eye.  This started 3 months ago.  Severity is moderate.  Duration of 5 weeks.  Since onset it is stable.  I, the attending physician,  performed the HPI with the patient and updated documentation appropriately.          Comments    85 y/o female pt here for 5 wk f/u for BRVO OS.  No change in [place] OU.  Denies pain, flashes, floaters.  No gtts.       Last edited by [name], MD on 03/24/2018  1:46 PM. (History)    pt states she feels like she needs glasses, she states she only wears OTC readers from the [employer] store,   Referring physician: Biagio Borg, MD [place], [place] [address]  HISTORICAL INFORMATION:   Selected notes from the MEDICAL RECORD NUMBER Referred by Dr. [name] for concern of retinal hemorrhage OS with +RAM sup temp LEE: 09.19.19 ([name]) [BCVA: OD: 20/ OS: 20/60-2] Ocular Hx-cataract OS, PCO OD, HTN ret OU, pseudo OD PMH-HTN, anxiety, CKD, COPD, HLD    CURRENT MEDICATIONS: No current outpatient medications on file. (Ophthalmic Drugs)   No current facility-administered medications for this visit.  (Ophthalmic Drugs)   Current Outpatient Medications (Other)  Medication Sig  . albuterol (PROVENTIL HFA;VENTOLIN HFA) 108 (90 Base) MCG/ACT inhaler Inhale 2 puffs into the lungs every 6 (six) hours as needed for wheezing or shortness of breath.  Marland Kitchen aspirin 325 MG EC tablet Take 325 mg by mouth daily.    . diazepam (VALIUM) 5 MG tablet TAKE 1 TABLET BY MOUTH TWICE A DAY AS NEEDED  . diclofenac sodium (VOLTAREN) 1 % GEL Apply 1 application topically 4 (four) times daily.  . fexofenadine (ALLEGRA) 180 MG tablet Take 1 tablet (180 mg total) by mouth  daily.  Marland Kitchen labetalol (NORMODYNE) 200 MG tablet TAKE 1 TABLET BY MOUTH TWICE A DAY  . rosuvastatin (CRESTOR) 10 MG tablet Take 1 tablet (10 mg total) by mouth daily.  Marland Kitchen spironolactone (ALDACTONE) 50 MG tablet Take 1 tablet (50 mg total) by mouth daily.  Marland Kitchen spironolactone (ALDACTONE) 50 MG tablet TAKE 1 TABLET BY MOUTH TWICE A DAY   Current Facility-Administered Medications (Other)  Medication Route  . Bevacizumab (AVASTIN) SOLN 1.25 mg Intravitreal  . Bevacizumab (AVASTIN) SOLN 1.25 mg Intravitreal  . Bevacizumab (AVASTIN) SOLN 1.25 mg Intravitreal  . Bevacizumab (AVASTIN) SOLN 1.25 mg Intravitreal  . Bevacizumab (AVASTIN) SOLN 1.25 mg Intravitreal      REVIEW OF SYSTEMS: ROS    Positive for: Gastrointestinal, Genitourinary, Cardiovascular, Eyes, Respiratory, Psychiatric   Negative for: Constitutional, Neurological, Skin, Musculoskeletal, HENT, Endocrine, Allergic/Imm, Heme/Lymph   Last edited by Matthew Folks, COA on 03/24/2018  1:23 PM. (History)       ALLERGIES No Known Allergies  PAST MEDICAL HISTORY Past Medical History:  Diagnosis Date  . Allergic rhinitis   . Anxiety   . Aortic valve regurgitation 10/12/2010  . CKD (chronic kidney disease) stage 3, GFR 30-59 ml/min (HCC) 09/26/2017  . COPD (chronic obstructive pulmonary disease) ([place])   . Disc disease, degenerative, cervical   .  Diverticulosis of colon   . DJD (degenerative joint disease)    LS spine  . Gallstones   . History of colonic polyps   . Hyperlipidemia   . Hypertension   . Osteoporosis    Past Surgical History:  Procedure Laterality Date  . ABDOMINAL HYSTERECTOMY    . CATARACT EXTRACTION    . COLONOSCOPY W/ POLYPECTOMY    . EYE SURGERY    . OOPHORECTOMY      FAMILY HISTORY Family History  Problem Relation Age of Onset  . Stroke Other        F 1st degree relative  . Parkinsonism Father   . Cancer Other        1st degree relative <50  . Coronary artery disease Other        F 1st degree  relative <60  . Diabetes Other        1st degree relative  . Hypertension Other   . Diabetes Mother   . Cancer Mother     SOCIAL HISTORY Social History   Tobacco Use  . Smoking status: Former Research scientist (life sciences)  . Smokeless tobacco: Never Used  Substance Use Topics  . Alcohol use: No    Alcohol/week: 0.0 standard drinks  . Drug use: No         OPHTHALMIC EXAM:  Base Eye Exam    Visual Acuity (Snellen - Linear)      Right Left   Dist [place] 20/40 20/60   Dist ph [place] 20/30 20/40       Tonometry (Tonopen, 1:24 PM)      Right Left   Pressure 19 19       Pupils      Dark Light Shape React APD   Right 4 3 Round Slow None   Left 43 3 Round Slow None       Visual Fields (Counting fingers)      Left Right    Full Full       Extraocular Movement      Right Left    Full, Ortho Full, Ortho       Neuro/Psych    Oriented x3:  Yes   Mood/Affect:  Normal       Dilation    Both eyes:  1.0% Mydriacyl, 2.5% Phenylephrine @ 1:24 PM        Slit Lamp and Fundus Exam    Slit Lamp Exam      Right Left   Lids/Lashes Dermatochalasis - upper lid, Meibomian gland dysfunction Dermatochalasis - upper lid, Meibomian gland dysfunction   Conjunctiva/Sclera Melanosis Melanosis   Cornea Arcus, 1+ Punctate epithelial erosions Arcus, 1+ Punctate epithelial erosions   Anterior Chamber Deep and quiet Deep and quiet   Iris Round and dilated, No NVI Round and dilated, No NVI   Lens PC IOL in good position 2+ Nuclear sclerosis, 2+ Cortical cataract   Vitreous Vitreous syneresis Vitreous syneresis       Fundus Exam      Right Left   Disc Pink and Sharp Pink and Sharp   C/D Ratio 0.6 0.6   Macula Blunted foveal reflex, Retinal pigment epithelial mottling, No heme or edema Blunted foveal reflex, regressing RAM superotemporal to fovea with clustered exudates inferiorly--improving. Improving IRF/SRF +CWS, large MA ST to fovea   Vessels Copper wiring, AV crossing changes, punctate focal area of  arterioler sheathing superior macula, severe peripheral attenuation of arterioles Vascular attenuation, Copper wiring; BRVO improving; RAM ST arcade regressing   Periphery  Attached Attached, RPE mottling at 0300 to [place] and Procedures for '@TODAY'$ @  OCT, Retina - OU - Both Eyes       Right Eye Quality was good. Central Foveal Thickness: 205. Progression has been stable. Findings include abnormal foveal contour, no IRF, no SRF, retinal drusen .   Left Eye Quality was good. Central Foveal Thickness: 214. Progression has improved. Findings include abnormal foveal contour, intraretinal fluid, intraretinal hyper-reflective material, outer retinal atrophy, no SRF (persistent IRF; mild interval improvement in [facility]).   Notes *Images captured and stored on drive  Diagnosis / Impression:  OD: No IRF/SRF OS: abnormal foveal contour -- interval improvement in IRF/IRHM  Clinical management:  See below  Abbreviations: NFP - Normal foveal profile. CME - cystoid macular edema. PED - pigment epithelial detachment. IRF - intraretinal fluid. SRF - subretinal fluid. EZ - ellipsoid zone. ERM - epiretinal membrane. ORA - outer retinal atrophy. ORT - outer retinal tubulation. SRHM - subretinal hyper-reflective material         Intravitreal Injection, Pharmacologic Agent - OS - Left Eye       Time Out 03/24/2018. 2:56 PM. Confirmed correct patient, procedure, site, and patient consented.   Anesthesia Topical anesthesia was used. Anesthetic medications included Lidocaine 2%, Proparacaine 0.5%.   Procedure Preparation included 5% betadine to ocular surface, eyelid speculum. A 30 gauge needle was used.   Injection:  1.25 mg Bevacizumab (AVASTIN) SOLN   NDC: 93235-573-22, Lot: [id number]'$ , Expiration date: 04/11/2018   Route: Intravitreal, Site: Left Eye, Waste: 0 mg  Post-op Post injection exam found visual acuity of at least counting fingers. The  patient tolerated the procedure well. There were no complications. The patient received written and verbal post procedure care education.                 ASSESSMENT/PLAN:    ICD-10-CM   1. 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 Branch retinal vein occlusion of left eye with macular edema H34.8320 Intravitreal Injection, Pharmacologic Agent - OS - Left Eye    Bevacizumab (AVASTIN) SOLN 1.25 mg  3. Retinal edema H35.81 OCT, Retina - OU - Both Eyes  4. Essential hypertension I10   5. Hypertensive retinopathy of both eyes H35.033   6. Combined forms of age-related cataract of left eye H25.812   7. Pseudophakia Z96.1     1-3. Retinal arterial macroaneurysm and BRVO w/ edema OS - at initial presentation 9.24.19, OCT macula showed +IRF/SRF - FA (09.24.19) shows blockage  - S/P IVA OS #1 (09.24.19), #2 (10.22.19), #3 (11.25.19), #4 (12.23.19) - today BCVA 20/40 - OCT with mild persistent IRF -- SRF resolved - recommend IVA OS #5 today (01.24.20) - pt wishes to proceed - RBA of procedure discussed, questions answered -- [place] [facility] [place] present today - informed consent obtained and signed - see procedure note - F/U 5-6 weeks, OCT, DFE  4,5. Hypertensive retinopathy OU - discussed importance of tight BP control - monitor  6. Combined form age related cataract OS-  - The symptoms of cataract, surgical options, and treatments and risks were discussed with patient. - discussed diagnosis and progression - not yet visually significant - monitor for now  7. Pseudophakia OD  - s/p CE/IOL OD  - doing well  - monitor   Ophthalmic Meds  Ordered this visit:  Meds ordered this encounter  Medications  . Bevacizumab (AVASTIN) SOLN 1.25 mg       Return for f/u 5-6 weeks, BRVO, DFE, OCT.  There are no Patient Instructions on file for this visit.   Explained the  diagnoses, plan, and follow up with the patient and they expressed understanding.  Patient expressed understanding of the importance of proper follow up care.   This document serves as a record of services personally performed by Gardiner Sleeper, MD, PhD. It was created on their behalf by Ernest Mallick, OA, an ophthalmic assistant. The creation of this record is the provider's dictation and/or activities during the visit.    Electronically signed by: Ernest Mallick, OA  01.23.2020 8:31 AM    Gardiner Sleeper, M.D., Ph.D. Diseases & Surgery of the Retina and Vitreous [facility]  I have reviewed the above documentation for accuracy and completeness, and I agree with the above. Gardiner Sleeper, M.D., Ph.D. 03/27/18 8:32 AM    Abbreviations: M myopia (nearsighted); A astigmatism; H hyperopia (farsighted); P presbyopia; Mrx spectacle prescription;  CTL contact lenses; OD right eye; OS left eye; OU both eyes  XT exotropia; ET esotropia; PEK punctate epithelial keratitis; PEE punctate epithelial erosions; DES dry eye syndrome; MGD meibomian gland dysfunction; ATs artificial tears; PFAT's preservative free artificial tears; [place] nuclear sclerotic cataract; PSC posterior subcapsular cataract; ERM epi-retinal membrane; PVD posterior vitreous detachment; RD retinal detachment; DM diabetes mellitus; DR diabetic retinopathy; NPDR non-proliferative diabetic retinopathy; PDR proliferative diabetic retinopathy; CSME clinically significant macular edema; DME diabetic macular edema; dbh dot blot hemorrhages; CWS cotton wool spot; POAG primary open angle glaucoma; C/D cup-to-disc ratio; HVF humphrey visual field; GVF goldmann visual field; OCT optical coherence tomography; IOP intraocular pressure; BRVO Branch retinal vein occlusion; CRVO central retinal vein occlusion; CRAO central retinal artery occlusion; BRAO branch retinal artery occlusion; RT retinal tear; SB scleral buckle; PPV pars plana  vitrectomy; VH Vitreous hemorrhage; PRP panretinal laser photocoagulation; IVK intravitreal kenalog; VMT vitreomacular traction; MH Macular hole;  NVD neovascularization of the disc; NVE neovascularization elsewhere; AREDS age related eye disease study; ARMD age related macular degeneration; POAG primary open angle glaucoma; EBMD epithelial/anterior basement membrane dystrophy; ACIOL anterior chamber intraocular lens; IOL intraocular lens; PCIOL posterior chamber intraocular lens; Phaco/IOL phacoemulsification with intraocular lens placement; [place] photorefractive keratectomy; LASIK laser assisted in situ keratomileusis; HTN hypertension; DM diabetes mellitus; COPD chronic obstructive pulmonary disease

## 2018-03-24 ENCOUNTER — Ambulatory Visit (INDEPENDENT_AMBULATORY_CARE_PROVIDER_SITE_OTHER): Payer: Medicare HMO | Admitting: Ophthalmology

## 2018-03-24 ENCOUNTER — Encounter (INDEPENDENT_AMBULATORY_CARE_PROVIDER_SITE_OTHER): Payer: Self-pay | Admitting: Ophthalmology

## 2018-03-24 DIAGNOSIS — Z961 Presence of intraocular lens: Secondary | ICD-10-CM

## 2018-03-24 DIAGNOSIS — H35012 Changes in retinal vascular appearance, left eye: Secondary | ICD-10-CM

## 2018-03-24 DIAGNOSIS — H25812 Combined forms of age-related cataract, left eye: Secondary | ICD-10-CM | POA: Diagnosis not present

## 2018-03-24 DIAGNOSIS — H34832 Tributary (branch) retinal vein occlusion, left eye, with macular edema: Secondary | ICD-10-CM

## 2018-03-24 DIAGNOSIS — I10 Essential (primary) hypertension: Secondary | ICD-10-CM

## 2018-03-24 DIAGNOSIS — H3581 Retinal edema: Secondary | ICD-10-CM | POA: Diagnosis not present

## 2018-03-24 DIAGNOSIS — H35033 Hypertensive retinopathy, bilateral: Secondary | ICD-10-CM | POA: Diagnosis not present

## 2018-03-27 ENCOUNTER — Encounter (INDEPENDENT_AMBULATORY_CARE_PROVIDER_SITE_OTHER): Payer: Self-pay | Admitting: Ophthalmology

## 2018-03-27 MED ORDER — BEVACIZUMAB CHEMO INJECTION 1.25MG/0.05ML SYRINGE FOR KALEIDOSCOPE
1.2500 mg | INTRAVITREAL | Status: DC
  Administered 2018-03-27: 1.25 mg via INTRAVITREAL

## 2018-04-05 ENCOUNTER — Ambulatory Visit: Payer: Medicare HMO | Admitting: Internal Medicine

## 2018-04-07 ENCOUNTER — Ambulatory Visit: Payer: 59 | Admitting: Podiatry

## 2018-04-12 ENCOUNTER — Ambulatory Visit: Payer: Self-pay | Admitting: Podiatry

## 2018-04-12 ENCOUNTER — Encounter: Payer: Self-pay | Admitting: Podiatry

## 2018-04-12 DIAGNOSIS — B351 Tinea unguium: Secondary | ICD-10-CM

## 2018-04-12 DIAGNOSIS — M79676 Pain in unspecified toe(s): Secondary | ICD-10-CM

## 2018-04-12 NOTE — Progress Notes (Signed)
Complaint:  Visit Type: Patient returns to my office for continued preventative foot care services. Complaint: Patient states" my nails have grown long and thick and become painful to walk and wear shoes" . The patient presents for preventative foot care services. No changes to ROS  Podiatric Exam: Vascular: dorsalis pedis and posterior tibial pulses are palpable bilateral. Capillary return is immediate. Temperature gradient is WNL. Skin turgor WNL  Sensorium: Normal Semmes Weinstein monofilament test. Normal tactile sensation bilaterally. Nail Exam: Pt has thick disfigured discolored nails with subungual debris noted bilateral entire nail hallux through fifth toenails Ulcer Exam: There is no evidence of ulcer or pre-ulcerative changes or infection. Orthopedic Exam: Muscle tone and strength are WNL. No limitations in general ROM. No crepitus or effusions noted. Foot type and digits show no abnormalities. Bony prominences are unremarkable. Skin: No Porokeratosis. No infection or ulcers  Diagnosis:  Onychomycosis, , Pain in right toe, pain in left toes  Treatment & Plan Procedures and Treatment: Consent by patient was obtained for treatment procedures.   Debridement of mycotic and hypertrophic toenails, 1 through 5 bilateral and clearing of subungual debris. No ulceration, no infection noted.  Return Visit-Office Procedure: Patient instructed to return to the office for a follow up visit 3 months for continued evaluation and treatment.    Tyheem Boughner DPM 

## 2018-04-18 ENCOUNTER — Telehealth: Payer: Self-pay

## 2018-04-18 NOTE — Telephone Encounter (Signed)
Copied from CRM [id number]. Topic: General - Other >> Apr 18, 2018 11:21 AM Gaynelle Adu wrote: Reason for CRM:  patient is calling to request full blood work to be completed. Please advise

## 2018-04-18 NOTE — Telephone Encounter (Signed)
Pt is scheduled for "full labs" to be done about June 23, but if she wants to have earlier for some other reason, we would need to see in the office,   Thanks

## 2018-04-18 NOTE — Telephone Encounter (Signed)
Pt stated that she has been having some nausea an vomitting. She has made an appt for tomorrow at 4pm.

## 2018-04-19 ENCOUNTER — Encounter: Payer: Self-pay | Admitting: Internal Medicine

## 2018-04-19 ENCOUNTER — Ambulatory Visit (INDEPENDENT_AMBULATORY_CARE_PROVIDER_SITE_OTHER): Payer: Medicare HMO | Admitting: Internal Medicine

## 2018-04-19 ENCOUNTER — Other Ambulatory Visit (INDEPENDENT_AMBULATORY_CARE_PROVIDER_SITE_OTHER): Payer: Medicare HMO

## 2018-04-19 VITALS — BP 136/84 | HR 68 | Temp 97.9°F | Ht 64.0 in | Wt 194.0 lb

## 2018-04-19 DIAGNOSIS — R42 Dizziness and giddiness: Secondary | ICD-10-CM | POA: Diagnosis not present

## 2018-04-19 DIAGNOSIS — H9191 Unspecified hearing loss, right ear: Secondary | ICD-10-CM | POA: Diagnosis not present

## 2018-04-19 DIAGNOSIS — R739 Hyperglycemia, unspecified: Secondary | ICD-10-CM

## 2018-04-19 DIAGNOSIS — I10 Essential (primary) hypertension: Secondary | ICD-10-CM | POA: Diagnosis not present

## 2018-04-19 DIAGNOSIS — Z0001 Encounter for general adult medical examination with abnormal findings: Secondary | ICD-10-CM

## 2018-04-19 DIAGNOSIS — H6121 Impacted cerumen, right ear: Secondary | ICD-10-CM | POA: Diagnosis not present

## 2018-04-19 LAB — HEPATIC FUNCTION PANEL
ALT: 17 U/L (ref 0–35)
AST: 18 U/L (ref 0–37)
Albumin: 4.3 g/dL (ref 3.5–5.2)
Alkaline Phosphatase: 58 U/L (ref 39–117)
Bilirubin, Direct: 0.1 mg/dL (ref 0.0–0.3)
TOTAL PROTEIN: 7.7 g/dL (ref 6.0–8.3)
Total Bilirubin: 0.4 mg/dL (ref 0.2–1.2)

## 2018-04-19 LAB — BASIC METABOLIC PANEL
BUN: 24 mg/dL — ABNORMAL HIGH (ref 6–23)
CALCIUM: 9.3 mg/dL (ref 8.4–10.5)
CO2: 25 mEq/L (ref 19–32)
Chloride: 102 mEq/L (ref 96–112)
Creatinine, Ser: 1.24 mg/dL — ABNORMAL HIGH (ref 0.40–1.20)
GFR: 49.99 mL/min — ABNORMAL LOW (ref >60.00)
Glucose, Bld: 82 mg/dL (ref 70–99)
Potassium: 4.5 mEq/L (ref 3.5–5.1)
Sodium: 136 mEq/L (ref 135–145)

## 2018-04-19 LAB — HEMOGLOBIN A1C: Hgb A1c MFr Bld: 5.8 % (ref 4.6–6.5)

## 2018-04-19 LAB — LIPID PANEL
CHOLESTEROL: 181 mg/dL (ref 0–200)
HDL: 56.5 mg/dL (ref >39.00)
LDL Cholesterol: 103 mg/dL — ABNORMAL HIGH (ref 0–99)
NonHDL: 124.36
Total CHOL/HDL Ratio: 3
Triglycerides: 105 mg/dL (ref 0.0–149.0)
VLDL: 21 mg/dL (ref 0.0–40.0)

## 2018-04-19 LAB — CBC WITH DIFFERENTIAL/PLATELET
Basophils Absolute: 0 10*3/uL (ref 0.0–0.1)
Basophils Relative: 0.7 % (ref 0.0–3.0)
Eosinophils Absolute: 0.3 10*3/uL (ref 0.0–0.7)
Eosinophils Relative: 4.9 % (ref 0.0–5.0)
HCT: 38.7 % (ref 36.0–46.0)
HEMOGLOBIN: 12.8 g/dL (ref 12.0–15.0)
Lymphocytes Relative: 36.3 % (ref 12.0–46.0)
Lymphs Abs: 2.2 10*3/uL (ref 0.7–4.0)
MCHC: 33 g/dL (ref 30.0–36.0)
MCV: 89.4 fl (ref 78.0–100.0)
MONOS PCT: 11 % (ref 3.0–12.0)
Monocytes Absolute: 0.7 10*3/uL (ref 0.1–1.0)
Neutro Abs: 2.9 10*3/uL (ref 1.4–7.7)
Neutrophils Relative %: 47.1 % (ref 43.0–77.0)
Platelets: 140 10*3/uL — ABNORMAL LOW (ref 150.0–400.0)
RBC: 4.32 Mil/uL (ref 3.87–5.11)
RDW: 14.7 % (ref 11.5–15.5)
WBC: 6.2 10*3/uL (ref 4.0–10.5)

## 2018-04-19 LAB — TSH: TSH: 1.26 u[IU]/mL (ref 0.35–4.50)

## 2018-04-19 MED ORDER — AMLODIPINE BESYLATE 5 MG PO TABS: 5.0000 mg | ORAL_TABLET | Freq: Every day | ORAL | 3 refills | Status: DC

## 2018-04-19 NOTE — Patient Instructions (Signed)
Your right ear was irrigated of wax today  Please take all new medication as prescribed - the amlodipine  Please continue all other medications as before, and refills have been done if requested.  Please have the pharmacy call with any other refills you may need.  Please continue your efforts at being more active, low cholesterol diet, and weight control.  You are otherwise up to date with prevention measures today.  Please keep your appointments with your specialists as you may have planned  Please go to the LAB in the Basement (turn left off the elevator) for the tests to be done today  /You will be contacted by phone if any changes need to be made immediately.  Otherwise, you will receive a letter about your results with an explanation, but please check with MyChart first.  Please remember to sign up for MyChart if you have not done so, as this will be important to you in the future with finding out test results, communicating by private email, and scheduling acute appointments online when needed.  Please return in 6 months, or sooner if needed, with Lab testing done 3-5 days before

## 2018-04-19 NOTE — Assessment & Plan Note (Signed)
stable overall by history and exam, recent data reviewed with pt, and pt to continue medical treatment as before,  to f/u any worsening symptoms or concerns  

## 2018-04-19 NOTE — Assessment & Plan Note (Signed)
Mild uncontrolled, to add amlodipine 5 qd 

## 2018-04-19 NOTE — Assessment & Plan Note (Signed)

## 2018-04-19 NOTE — Progress Notes (Signed)
Subjective:    Patient ID: Megan Baldwin, female    DOB: 11/23/1933, 85 y.o.   MRN: [id number]  HPI  Here for wellness and f/u;  Overall doing ok;  Pt denies Chest pain, worsening SOB, DOE, wheezing, orthopnea, PND, worsening LE edema, palpitations, or syncope.  Pt denies neurological change such as new headache, facial or extremity weakness.  Pt denies polydipsia, polyuria, or low sugar symptoms. Pt states overall good compliance with treatment and medications, good tolerability, and has been trying to follow appropriate diet.  Pt denies worsening depressive symptoms, suicidal ideation or panic. No fever, night sweats, wt loss, loss of appetite, or other constitutional symptoms.  Pt states good ability with ADL's, has low fall risk, home safety reviewed and adequate, no other significant changes in hearing or vision, and only occasionally active with exercise. Also c/o 2 mo Dizzy with sometimes N/V multiple times that seemed to start after a hairwashing with placing the head back to the sink while sitting, but she is thinking it may happern when doesn't eat well, wondering if this means she has DM since she has had glucose impairment in past .Has hx of very brief episode of dizziness similar a few years ago, but this episode keeps coming back.  Does Check BP frequently at home at usually mild increased over 140/90  Also has 1 wk hx of right ear reduced hearing, muffled without vision change or other new complaint Past Medical History:  Diagnosis Date  . Allergic rhinitis   . Anxiety   . Aortic valve regurgitation 10/12/2010  . CKD (chronic kidney disease) stage 3, GFR 30-59 ml/min (HCC) 09/26/2017  . COPD (chronic obstructive pulmonary disease) (HCC)   . Disc disease, degenerative, cervical   . Diverticulosis of colon   . DJD (degenerative joint disease)    LS spine  . Gallstones   . History of colonic polyps   . Hyperlipidemia   . Hypertension   . Osteoporosis    Past Surgical History:    Procedure Laterality Date  . ABDOMINAL HYSTERECTOMY    . CATARACT EXTRACTION    . COLONOSCOPY W/ POLYPECTOMY    . EYE SURGERY    . OOPHORECTOMY      reports that she has quit smoking. She has never used smokeless tobacco. She reports that she does not drink alcohol or use drugs. family history includes Cancer in her mother and another family member; Coronary artery disease in an other family member; Diabetes in her mother and another family member; Hypertension in an other family member; Parkinsonism in her father; Stroke in an other family member. No Known Allergies Current Outpatient Medications on File Prior to Visit  Medication Sig Dispense Refill  . albuterol (PROVENTIL HFA;VENTOLIN HFA) 108 (90 Base) MCG/ACT inhaler Inhale 2 puffs into the lungs every 6 (six) hours as needed for wheezing or shortness of breath. 1 Inhaler 11  . aspirin 325 MG EC tablet Take 325 mg by mouth daily.      . diazepam (VALIUM) 5 MG tablet TAKE 1 TABLET BY MOUTH TWICE A DAY AS NEEDED 60 tablet 0  . diclofenac sodium (VOLTAREN) 1 % GEL Apply 1 application topically 4 (four) times daily. 1 Tube 11  . labetalol (NORMODYNE) 200 MG tablet TAKE 1 TABLET BY MOUTH TWICE A DAY 60 tablet 11  . rosuvastatin (CRESTOR) 10 MG tablet Take 1 tablet (10 mg total) by mouth daily. 30 tablet 11  . spironolactone (ALDACTONE) 50 MG tablet TAKE 1 TABLET BY  MOUTH TWICE A DAY 60 tablet 11  . fexofenadine (ALLEGRA) 180 MG tablet Take 1 tablet (180 mg total) by mouth daily. 90 tablet 3   Current Facility-Administered Medications on File Prior to Visit  Medication Dose Route Frequency Provider Last Rate Last Dose  . Bevacizumab (AVASTIN) SOLN 1.25 mg  1.25 mg Intravitreal  Rennis Chris, MD   1.25 mg at 11/22/17 1319   Review of Systems Constitutional: Negative for other unusual diaphoresis, sweats, appetite or weight changes HENT: Negative for other worsening hearing loss, ear pain, facial swelling, mouth sores or neck stiffness.    Eyes: Negative for other worsening pain, redness or other visual disturbance.  Respiratory: Negative for other stridor or swelling Cardiovascular: Negative for other palpitations or other chest pain  Gastrointestinal: Negative for worsening diarrhea or loose stools, blood in stool, distention or other pain Genitourinary: Negative for hematuria, flank pain or other change in urine volume.  Musculoskeletal: Negative for myalgias or other joint swelling.  Skin: Negative for other color change, or other wound or worsening drainage.  Neurological: Negative for other syncope or numbness. Hematological: Negative for other adenopathy or swelling Psychiatric/Behavioral: Negative for hallucinations, other worsening agitation, SI, self-injury, or new decreased concentration All other system neg per pt    Objective:   Physical Exam BP 136/84   Pulse 68   Temp 97.9 F (36.6 C) (Oral)   Ht 5\' 4"  (1.626 m)   Wt 194 lb (88 kg)   SpO2 94%   BMI 33.30 kg/m  VS noted,  Constitutional: Pt is oriented to person, place, and time. Appears well-developed and well-nourished, in no significant distress and comfortable Head: Normocephalic and atraumatic  Eyes: Conjunctivae and EOM are normal. Pupils are equal, round, and reactive to light Right Ear: External ear normal without discharge Left Ear: External ear normal without discharge Nose: Nose without discharge or deformity Mouth/Throat: Oropharynx is without other ulcerations and moist  Neck: Normal range of motion. Neck supple. No JVD present. No tracheal deviation present or significant neck LA or mass Cardiovascular: Normal rate, regular rhythm, normal heart sounds and intact distal pulses.   Pulmonary/Chest: WOB normal and breath sounds without rales or wheezing  Abdominal: Soft. Bowel sounds are normal. NT. No HSM  Musculoskeletal: Normal range of motion. Exhibits no edema Lymphadenopathy: Has no other cervical adenopathy.  Neurological: Pt is  alert and oriented to person, place, and time. Pt has normal reflexes. No cranial nerve deficit. Motor grossly intact, Gait intact Skin: Skin is warm and dry. No rash noted or new ulcerations Psychiatric:  Has normal mood and affect. Behavior is normal without agitation No other exam findings  Lab Results  Component Value Date   WBC 5.4 09/26/2017   HGB 13.0 09/26/2017   HCT 38.7 09/26/2017   PLT 154.0 09/26/2017   GLUCOSE 131 (H) 10/03/2017   CHOL 199 09/26/2017   TRIG 93.0 09/26/2017   HDL 61.30 09/26/2017   LDLDIRECT 121.4 04/10/2013   LDLCALC 120 (H) 09/26/2017   ALT 16 09/26/2017   AST 19 09/26/2017   NA 138 10/03/2017   K 4.1 10/03/2017   CL 105 10/03/2017   CREATININE 1.28 (H) 10/03/2017   BUN 18 10/03/2017   CO2 24 10/03/2017   TSH 1.64 09/26/2017       Assessment & Plan:

## 2018-04-19 NOTE — Assessment & Plan Note (Signed)
Improved with irrigation of wax impaction,  to f/u any worsening symptoms or concerns 

## 2018-04-19 NOTE — Assessment & Plan Note (Addendum)
C/w BPV, for meclizine prn,  to f/u any worsening symptoms or concerns  In addition to the time spent performing CPE, I spent an additional 25 minutes face to face,in which greater than 50% of this time was spent in counseling and coordination of care for patient's acute illness as documented, including the differential dx, treatment, further evaluation and other management of vertigo, right hearing loss, HTN, hyperglycemia

## 2018-04-19 NOTE — Progress Notes (Signed)
Patient consent obtained. Irrigation with water and peroxide performed. Full view of tympanic membranes after procedure.  Patient tolerated procedure well.   

## 2018-04-20 LAB — URINALYSIS, ROUTINE W REFLEX MICROSCOPIC
Bilirubin Urine: NEGATIVE
Hgb urine dipstick: NEGATIVE
Ketones, ur: NEGATIVE
Nitrite: NEGATIVE
RBC / HPF: NONE SEEN (ref 0–?)
Specific Gravity, Urine: 1.01 (ref 1.000–1.030)
Total Protein, Urine: NEGATIVE
Urine Glucose: NEGATIVE
Urobilinogen, UA: 0.2 (ref 0.0–1.0)
pH: 7 (ref 5.0–8.0)

## 2018-05-04 NOTE — Progress Notes (Addendum)
[facility] Note  05/08/2018     CHIEF COMPLAINT Patient presents for Retina Follow Up   HISTORY OF PRESENT ILLNESS: Megan Baldwin is a 85 y.o. female who presents to the clinic today for:   HPI    Retina Follow Up    Patient presents with  CRVO/BRVO.  In left eye.  This started 6 weeks ago.  Severity is moderate.  Duration of 6 weeks.  Since onset it is stable.  I, the attending physician,  performed the HPI with the patient and updated documentation appropriately.          Comments    Patient here for 6 weeks retina follow up for BRVO with edema OS. Patient states vision could be better. Vision is fair. No eye pain. Has had some discomfort.       Last edited by [name], MD on 05/08/2018  9:52 AM. (History)    pt states she feels like she needs glasses, she states she only wears OTC readers from the dollar store,   Referring physician: Biagio Borg, MD [place], [place] [address]  HISTORICAL INFORMATION:   Selected notes from the MEDICAL RECORD NUMBER Referred by Dr. [name] for concern of retinal hemorrhage OS with +RAM sup temp LEE: 09.19.19 ([name]) [BCVA: OD: 20/ OS: 20/60-2] Ocular Hx-cataract OS, PCO OD, HTN ret OU, pseudo OD PMH-HTN, anxiety, CKD, COPD, HLD    CURRENT MEDICATIONS: No current outpatient medications on file. (Ophthalmic Drugs)   No current facility-administered medications for this visit.  (Ophthalmic Drugs)   Current Outpatient Medications (Other)  Medication Sig  . albuterol (PROVENTIL HFA;VENTOLIN HFA) 108 (90 Base) MCG/ACT inhaler Inhale 2 puffs into the lungs every 6 (six) hours as needed for wheezing or shortness of breath.  Marland Kitchen amLODipine (NORVASC) 5 MG tablet Take 1 tablet (5 mg total) by mouth daily.  Marland Kitchen aspirin 325 MG EC tablet Take 325 mg by mouth daily.    . diazepam (VALIUM) 5 MG tablet TAKE 1 TABLET BY MOUTH TWICE A DAY AS NEEDED  . diclofenac sodium (VOLTAREN) 1 % GEL Apply  1 application topically 4 (four) times daily.  . fexofenadine (ALLEGRA) 180 MG tablet Take 1 tablet (180 mg total) by mouth daily.  Marland Kitchen labetalol (NORMODYNE) 200 MG tablet TAKE 1 TABLET BY MOUTH TWICE A DAY  . rosuvastatin (CRESTOR) 10 MG tablet Take 1 tablet (10 mg total) by mouth daily.  Marland Kitchen spironolactone (ALDACTONE) 50 MG tablet TAKE 1 TABLET BY MOUTH TWICE A DAY   Current Facility-Administered Medications (Other)  Medication Route  . Bevacizumab (AVASTIN) SOLN 1.25 mg Intravitreal  . Bevacizumab (AVASTIN) SOLN 1.25 mg Intravitreal      REVIEW OF SYSTEMS: ROS    Positive for: Gastrointestinal, Genitourinary, Cardiovascular, Eyes, Respiratory, Psychiatric   Negative for: Constitutional, Neurological, Skin, Musculoskeletal, HENT, Endocrine, Allergic/Imm, Heme/Lymph   Last edited by [name] on 05/08/2018  9:20 AM. (History)       ALLERGIES No Known Allergies  PAST MEDICAL HISTORY Past Medical History:  Diagnosis Date  . Allergic rhinitis   . Anxiety   . Aortic valve regurgitation 10/12/2010  . CKD (chronic kidney disease) stage 3, GFR 30-59 ml/min (HCC) 09/26/2017  . COPD (chronic obstructive pulmonary disease) ([place])   . Disc disease, degenerative, cervical   . Diverticulosis of colon   . DJD (degenerative joint disease)    LS spine  . Gallstones   . History  of colonic polyps   . Hyperlipidemia   . Hypertension   . Osteoporosis    Past Surgical History:  Procedure Laterality Date  . ABDOMINAL HYSTERECTOMY    . CATARACT EXTRACTION    . COLONOSCOPY W/ POLYPECTOMY    . EYE SURGERY    . OOPHORECTOMY      FAMILY HISTORY Family History  Problem Relation Age of Onset  . Stroke Other        F 1st degree relative  . Parkinsonism Father   . Cancer Other        1st degree relative <50  . Coronary artery disease Other        F 1st degree relative <60  . Diabetes Other        1st degree relative  . Hypertension Other   . Diabetes Mother   . Cancer Mother      SOCIAL HISTORY Social History   Tobacco Use  . Smoking status: Former Research scientist (life sciences)  . Smokeless tobacco: Never Used  Substance Use Topics  . Alcohol use: No    Alcohol/week: 0.0 standard drinks  . Drug use: No         OPHTHALMIC EXAM:  Base Eye Exam    Visual Acuity (Snellen - Linear)      Right Left   Dist [place] 20/25 +2 20/30 -2   Dist ph [place]  NI       Tonometry (Tonopen, 9:17 AM)      Right Left   Pressure 16 16       Pupils      Dark Light Shape React APD   Right 4 3 Round Brisk None   Left 4 3 Round Brisk None       Visual Fields (Counting fingers)      Left Right    Full Full       Extraocular Movement      Right Left    Full, Ortho Full, Ortho       Neuro/Psych    Oriented x3:  Yes   Mood/Affect:  Normal       Dilation    Both eyes:  1.0% Mydriacyl, 2.5% Phenylephrine @ 9:17 AM        Slit Lamp and Fundus Exam    Slit Lamp Exam      Right Left   Lids/Lashes Dermatochalasis - upper lid, Meibomian gland dysfunction Dermatochalasis - upper lid, Meibomian gland dysfunction   Conjunctiva/Sclera Melanosis Melanosis   Cornea Arcus, 1+ Punctate epithelial erosions Arcus, 1+ Punctate epithelial erosions   Anterior Chamber Deep and quiet Deep and quiet   Iris Round and dilated, No NVI Round and dilated, No NVI   Lens PC IOL in good position 2+ Nuclear sclerosis, 2+ Cortical cataract   Vitreous Vitreous syneresis Vitreous syneresis       Fundus Exam      Right Left   Disc Pink and Sharp Pink and Sharp   C/D Ratio 0.6 0.6   Macula Blunted foveal reflex, Retinal pigment epithelial mottling, No heme or edema Blunted foveal reflex, regressing RAM superotemporal to fovea with clustered exudates inferiorly--improving. Improving IRF, large MA ST to fovea -- improving   Vessels Copper wiring, AV crossing changes, punctate focal area of arterioler sheathing superior macula, severe peripheral attenuation of arterioles Vascular attenuation, Copper wiring; BRVO  improving; RAM ST arcade regressing   Periphery Attached Attached, RPE mottling at 0300 to 0430          IMAGING AND  PROCEDURES  Imaging and Procedures for '@TODAY'$ @  OCT, Retina - OU - Both Eyes       Right Eye Quality was good. Central Foveal Thickness: 206. Progression has been stable. Findings include abnormal foveal contour, no IRF, no SRF, retinal drusen .   Left Eye Quality was good. Central Foveal Thickness: 209. Progression has improved. Findings include intraretinal hyper-reflective material, outer retinal atrophy, no SRF, normal foveal contour, no IRF (Patchy ORA, interval improvement in IRF/IRHM).   Notes *Images captured and stored on drive  Diagnosis / Impression:  OD: No IRF/SRF OS: abnormal foveal contour -- interval improvement in IRF/IRHM  Clinical management:  See below  Abbreviations: NFP - Normal foveal profile. CME - cystoid macular edema. PED - pigment epithelial detachment. IRF - intraretinal fluid. SRF - subretinal fluid. EZ - ellipsoid zone. ERM - epiretinal membrane. ORA - outer retinal atrophy. ORT - outer retinal tubulation. SRHM - subretinal hyper-reflective material         Intravitreal Injection, Pharmacologic Agent - OS - Left Eye       Time Out 05/08/2018. 10:19 AM. Confirmed correct patient, procedure, site, and patient consented.   Anesthesia Topical anesthesia was used. Anesthetic medications included Lidocaine 2%, Proparacaine 0.5%.   Procedure Preparation included 5% betadine to ocular surface, eyelid speculum. A supplied needle was used.   Injection:  1.25 mg Bevacizumab (AVASTIN) SOLN   NDC: 06301-601-09, Lot: [id number]'$ , Expiration date: 06/14/2018   Route: Intravitreal, Site: Left Eye, Waste: 0 mL  Post-op Post injection exam found visual acuity of at least counting fingers. The patient tolerated the procedure well. There were no complications. The patient received written and verbal post procedure care education.                  ASSESSMENT/PLAN:    ICD-10-CM   1. 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 Branch retinal vein occlusion of left eye with macular edema H34.8320 Intravitreal Injection, Pharmacologic Agent - OS - Left Eye    Bevacizumab (AVASTIN) SOLN 1.25 mg  3. Retinal edema H35.81 OCT, Retina - OU - Both Eyes  4. Essential hypertension I10   5. Hypertensive retinopathy of both eyes H35.033   6. Combined forms of age-related cataract of left eye H25.812   7. Pseudophakia Z96.1     1-3. Retinal arterial macroaneurysm and BRVO w/ edema OS - at initial presentation 9.24.19, OCT macula showed +IRF/SRF - FA (09.24.19) shows blockage  - S/P IVA OS #1 (09.24.19), #2 (10.22.19), #3 (11.25.19), #4 (12.23.19), #5 (01.24.20) - today BCVA improved to 20/30-2 from 20/40 - OCT with interval improvement -- IRF and SRF essentially resolved - recommend IVA OS #6 today (03.09.20) w/ extension to 7-8 wks - pt wishes to proceed - RBA of procedure discussed, questions answered -- daughter from 16.09.20 present today - informed consent obtained and signed - see procedure note - F/U 7-8 weeks, OCT, DFE, repeat FA (optos, transit OS), possible laser (focal vs segmental PRP)  4,5. Hypertensive retinopathy OU - discussed importance of tight BP control - monitor   6. Combined form age related cataract OS-  - The symptoms of cataract, surgical options, and treatments and risks were discussed with patient. - discussed diagnosis and progression - not yet visually significant - monitor for now  7. Pseudophakia OD  - s/p CE/IOL OD  - doing well  - monitor   Ophthalmic  Meds Ordered this visit:  Meds ordered this encounter  Medications  . Bevacizumab (AVASTIN) SOLN 1.25 mg       Return for f/u 7-8 weeks BRVO OS, DFE, OCT.  There are no Patient Instructions on file for this visit.   Explained  the diagnoses, plan, and follow up with the patient and they expressed understanding.  Patient expressed understanding of the importance of proper follow up care.   This document serves as a record of services personally performed by Gardiner Sleeper, MD, PhD. It was created on their behalf by Ernest Mallick, OA, an ophthalmic assistant. The creation of this record is the provider's dictation and/or activities during the visit.    Electronically signed by: Ernest Mallick, OA  03.05.2020 11:42 AM   Gardiner Sleeper, M.D., Ph.D. Diseases & Surgery of the Retina and Vitreous [facility]  I have reviewed the above documentation for accuracy and completeness, and I agree with the above. Gardiner Sleeper, M.D., Ph.D. 05/08/18 11:43 AM     Abbreviations: M myopia (nearsighted); A astigmatism; H hyperopia (farsighted); P presbyopia; Mrx spectacle prescription;  CTL contact lenses; OD right eye; OS left eye; OU both eyes  XT exotropia; ET esotropia; PEK punctate epithelial keratitis; PEE punctate epithelial erosions; DES dry eye syndrome; MGD meibomian gland dysfunction; ATs artificial tears; PFAT's preservative free artificial tears; [place] nuclear sclerotic cataract; PSC posterior subcapsular cataract; ERM epi-retinal membrane; PVD posterior vitreous detachment; RD retinal detachment; DM diabetes mellitus; DR diabetic retinopathy; NPDR non-proliferative diabetic retinopathy; PDR proliferative diabetic retinopathy; CSME clinically significant macular edema; DME diabetic macular edema; dbh dot blot hemorrhages; CWS cotton wool spot; POAG primary open angle glaucoma; C/D cup-to-disc ratio; HVF humphrey visual field; GVF goldmann visual field; OCT optical coherence tomography; IOP intraocular pressure; BRVO Branch retinal vein occlusion; CRVO central retinal vein occlusion; CRAO central retinal artery occlusion; BRAO branch retinal artery occlusion; RT retinal tear; SB scleral buckle; PPV pars  plana vitrectomy; VH Vitreous hemorrhage; PRP panretinal laser photocoagulation; IVK intravitreal kenalog; VMT vitreomacular traction; MH Macular hole;  NVD neovascularization of the disc; NVE neovascularization elsewhere; AREDS age related eye disease study; ARMD age related macular degeneration; POAG primary open angle glaucoma; EBMD epithelial/anterior basement membrane dystrophy; ACIOL anterior chamber intraocular lens; IOL intraocular lens; PCIOL posterior chamber intraocular lens; Phaco/IOL phacoemulsification with intraocular lens placement; [place] photorefractive keratectomy; LASIK laser assisted in situ keratomileusis; HTN hypertension; DM diabetes mellitus; COPD chronic obstructive pulmonary disease

## 2018-05-08 ENCOUNTER — Encounter (INDEPENDENT_AMBULATORY_CARE_PROVIDER_SITE_OTHER): Payer: Self-pay | Admitting: Ophthalmology

## 2018-05-08 ENCOUNTER — Ambulatory Visit (INDEPENDENT_AMBULATORY_CARE_PROVIDER_SITE_OTHER): Payer: Medicare HMO | Admitting: Ophthalmology

## 2018-05-08 DIAGNOSIS — H25812 Combined forms of age-related cataract, left eye: Secondary | ICD-10-CM

## 2018-05-08 DIAGNOSIS — H35012 Changes in retinal vascular appearance, left eye: Secondary | ICD-10-CM

## 2018-05-08 DIAGNOSIS — Z961 Presence of intraocular lens: Secondary | ICD-10-CM | POA: Diagnosis not present

## 2018-05-08 DIAGNOSIS — I10 Essential (primary) hypertension: Secondary | ICD-10-CM

## 2018-05-08 DIAGNOSIS — H3581 Retinal edema: Secondary | ICD-10-CM | POA: Diagnosis not present

## 2018-05-08 DIAGNOSIS — H34832 Tributary (branch) retinal vein occlusion, left eye, with macular edema: Secondary | ICD-10-CM

## 2018-05-08 DIAGNOSIS — H35033 Hypertensive retinopathy, bilateral: Secondary | ICD-10-CM

## 2018-05-08 MED ORDER — BEVACIZUMAB CHEMO INJECTION 1.25MG/0.05ML SYRINGE FOR KALEIDOSCOPE
1.2500 mg | INTRAVITREAL | Status: DC
  Administered 2018-05-08: 1.25 mg via INTRAVITREAL

## 2018-06-26 ENCOUNTER — Encounter (INDEPENDENT_AMBULATORY_CARE_PROVIDER_SITE_OTHER): Payer: Medicare HMO | Admitting: Ophthalmology

## 2018-06-26 ENCOUNTER — Other Ambulatory Visit: Payer: Self-pay | Admitting: Internal Medicine

## 2018-06-26 NOTE — Telephone Encounter (Signed)
Done erx 

## 2018-09-08 ENCOUNTER — Encounter: Payer: Self-pay | Admitting: Podiatry

## 2018-09-08 ENCOUNTER — Ambulatory Visit (INDEPENDENT_AMBULATORY_CARE_PROVIDER_SITE_OTHER): Payer: Medicare HMO | Admitting: Podiatry

## 2018-09-08 ENCOUNTER — Other Ambulatory Visit: Payer: Self-pay

## 2018-09-08 DIAGNOSIS — B351 Tinea unguium: Secondary | ICD-10-CM

## 2018-09-08 DIAGNOSIS — M79676 Pain in unspecified toe(s): Secondary | ICD-10-CM

## 2018-09-08 NOTE — Patient Instructions (Signed)

## 2018-09-10 NOTE — Progress Notes (Signed)
Subjective:  Megan Baldwin presents to clinic today with cc of  painful, thick, discolored, elongated toenails 1-5 b/l that become tender and cannot cut because of thickness. Pain is aggravated when wearing enclosed shoe gear and relieved with periodic professional debridement.   Current Outpatient Medications:  .  albuterol (PROVENTIL HFA;VENTOLIN HFA) 108 (90 Base) MCG/ACT inhaler, Inhale 2 puffs into the lungs every 6 (six) hours as needed for wheezing or shortness of breath., Disp: 1 Inhaler, Rfl: 11 .  amLODipine (NORVASC) 5 MG tablet, Take 1 tablet (5 mg total) by mouth daily., Disp: 90 tablet, Rfl: 3 .  aspirin 325 MG EC tablet, Take 325 mg by mouth daily.  , Disp: , Rfl:  .  diazepam (VALIUM) 5 MG tablet, TAKE 1 TABLET BY MOUTH TWICE A DAY AS NEEDED, Disp: 60 tablet, Rfl: 0 .  diclofenac sodium (VOLTAREN) 1 % GEL, Apply 1 application topically 4 (four) times daily., Disp: 1 Tube, Rfl: 11 .  fexofenadine (ALLEGRA) 180 MG tablet, Take 1 tablet (180 mg total) by mouth daily., Disp: 90 tablet, Rfl: 3 .  labetalol (NORMODYNE) 200 MG tablet, TAKE 1 TABLET BY MOUTH TWICE A DAY, Disp: 60 tablet, Rfl: 11 .  rosuvastatin (CRESTOR) 10 MG tablet, Take 1 tablet (10 mg total) by mouth daily., Disp: 30 tablet, Rfl: 11 .  spironolactone (ALDACTONE) 50 MG tablet, TAKE 1 TABLET BY MOUTH TWICE A DAY, Disp: 60 tablet, Rfl: 11  Current Facility-Administered Medications:  .  Bevacizumab (AVASTIN) SOLN 1.25 mg, 1.25 mg, Intravitreal, , [name], MD, 1.25 mg at 11/22/17 1319 .  Bevacizumab (AVASTIN) SOLN 1.25 mg, 1.25 mg, Intravitreal, , [name], MD, 1.25 mg at 05/08/18 1019   No Known Allergies   Objective:  Physical Examination:  Vascular Examination: Capillary refill time immediate x 10 digits.  Palpable DP/PT pulses b/l.  Digital hair present b/l.  No edema noted b/l.  Skin temperature gradient WNL b/l.  Dermatological Examination: Skin with normal turgor, texture and tone  b/l.  No open wounds b/l.  No interdigital macerations noted b/l.  Elongated, thick, discolored brittle toenails with subungual debris and pain on dorsal palpation of nailbeds 1-5 b/l.  Musculoskeletal Examination: Muscle strength 5/5 to all muscle groups b/l.  No pain, crepitus or joint discomfort with active/passive ROM.  Neurological Examination: Sensation intact 5/5 b/l with 10 gram monofilament.  Vibratory sensation intact b/l.  Assessment: Mycotic nail infection with pain 1-5 b/l  Plan: 1. Toenails 1-5 b/l were debrided in length and girth without iatrogenic laceration. 2.  Continue soft, supportive shoe gear daily. 3.  Report any pedal injuries to medical professional. 4.  Follow up 3 months. 5.  Patient/POA to call should there be a question/concern in there interim.

## 2018-10-06 ENCOUNTER — Other Ambulatory Visit: Payer: Self-pay | Admitting: Internal Medicine

## 2018-10-06 NOTE — Telephone Encounter (Signed)
Done erx 

## 2018-10-18 ENCOUNTER — Encounter: Payer: Self-pay | Admitting: Internal Medicine

## 2018-10-18 ENCOUNTER — Ambulatory Visit: Payer: Medicare HMO | Admitting: Internal Medicine

## 2018-10-18 ENCOUNTER — Ambulatory Visit (INDEPENDENT_AMBULATORY_CARE_PROVIDER_SITE_OTHER): Payer: Medicare HMO | Admitting: Internal Medicine

## 2018-10-18 ENCOUNTER — Other Ambulatory Visit (INDEPENDENT_AMBULATORY_CARE_PROVIDER_SITE_OTHER): Payer: Medicare HMO

## 2018-10-18 DIAGNOSIS — E611 Iron deficiency: Secondary | ICD-10-CM

## 2018-10-18 DIAGNOSIS — E538 Deficiency of other specified B group vitamins: Secondary | ICD-10-CM

## 2018-10-18 DIAGNOSIS — E785 Hyperlipidemia, unspecified: Secondary | ICD-10-CM | POA: Diagnosis not present

## 2018-10-18 DIAGNOSIS — E559 Vitamin D deficiency, unspecified: Secondary | ICD-10-CM | POA: Diagnosis not present

## 2018-10-18 DIAGNOSIS — N183 Chronic kidney disease, stage 3 unspecified: Secondary | ICD-10-CM

## 2018-10-18 DIAGNOSIS — I10 Essential (primary) hypertension: Secondary | ICD-10-CM | POA: Diagnosis not present

## 2018-10-18 DIAGNOSIS — R739 Hyperglycemia, unspecified: Secondary | ICD-10-CM | POA: Diagnosis not present

## 2018-10-18 LAB — HEPATIC FUNCTION PANEL
ALT: 20 U/L (ref 0–35)
AST: 21 U/L (ref 0–37)
Albumin: 4.7 g/dL (ref 3.5–5.2)
Alkaline Phosphatase: 72 U/L (ref 39–117)
Bilirubin, Direct: 0.1 mg/dL (ref 0.0–0.3)
Total Bilirubin: 0.5 mg/dL (ref 0.2–1.2)
Total Protein: 8.7 g/dL — ABNORMAL HIGH (ref 6.0–8.3)

## 2018-10-18 LAB — BASIC METABOLIC PANEL
BUN: 17 mg/dL (ref 6–23)
CO2: 24 mEq/L (ref 19–32)
Calcium: 10.3 mg/dL (ref 8.4–10.5)
Chloride: 103 mEq/L (ref 96–112)
Creatinine, Ser: 1.28 mg/dL — ABNORMAL HIGH (ref 0.40–1.20)
GFR: 48.13 mL/min — ABNORMAL LOW (ref >60.00)
Glucose, Bld: 99 mg/dL (ref 70–99)
Potassium: 4.5 mEq/L (ref 3.5–5.1)
Sodium: 136 mEq/L (ref 135–145)

## 2018-10-18 LAB — LIPID PANEL
Cholesterol: 207 mg/dL — ABNORMAL HIGH (ref 0–200)
HDL: 62.6 mg/dL (ref >39.00)
LDL Cholesterol: 123 mg/dL — ABNORMAL HIGH (ref 0–99)
NonHDL: 144.81
Total CHOL/HDL Ratio: 3
Triglycerides: 109 mg/dL (ref 0.0–149.0)
VLDL: 21.8 mg/dL (ref 0.0–40.0)

## 2018-10-18 LAB — HEMOGLOBIN A1C: Hgb A1c MFr Bld: 6 % (ref 4.6–6.5)

## 2018-10-18 NOTE — Progress Notes (Signed)
Patient ID: Megan Baldwin, female   DOB: 01-11-1934, 85 y.o.   MRN: [id number]  Phone note  Cumulative time during 7-day interval 12 min, there was not an associated office visit for this concern within a 7 day period.  Verbal consent for services obtained from patient prior to services given.  Names of all persons present for services: Cathlean Cower, MD, patient  Chief complaint: hyeprglycemia  History, background, results pertinent:  Here to f/u; overall doing ok,  Pt denies chest pain, increasing sob or doe, wheezing, orthopnea, PND, increased LE swelling, palpitations, dizziness or syncope.  Pt denies new neurological symptoms such as new headache, or facial or extremity weakness or numbness.  Pt denies polydipsia, polyuria, or low sugar episode.  Pt states overall good compliance with meds, mostly trying to follow appropriate diet Past Medical History:  Diagnosis Date  . Allergic rhinitis   . Anxiety   . Aortic valve regurgitation 10/12/2010  . CKD (chronic kidney disease) stage 3, GFR 30-59 ml/min (HCC) 09/26/2017  . COPD (chronic obstructive pulmonary disease) ([place])   . Disc disease, degenerative, cervical   . Diverticulosis of colon   . DJD (degenerative joint disease)    LS spine  . Gallstones   . History of colonic polyps   . Hyperlipidemia   . Hypertension   . Osteoporosis    Past Surgical History:  Procedure Laterality Date  . ABDOMINAL HYSTERECTOMY    . CATARACT EXTRACTION    . COLONOSCOPY W/ POLYPECTOMY    . EYE SURGERY    . OOPHORECTOMY      reports that she has quit smoking. She has never used smokeless tobacco. She reports that she does not drink alcohol or use drugs. family history includes Cancer in her mother and another family member; Coronary artery disease in an other family member; Diabetes in her mother and another family member; Hypertension in an other family member; Parkinsonism in her father; Stroke in an other family member. No Known Allergies Current  Outpatient Medications on File Prior to Visit  Medication Sig Dispense Refill  . albuterol (PROVENTIL HFA;VENTOLIN HFA) 108 (90 Base) MCG/ACT inhaler Inhale 2 puffs into the lungs every 6 (six) hours as needed for wheezing or shortness of breath. 1 Inhaler 11  . amLODipine (NORVASC) 5 MG tablet Take 1 tablet (5 mg total) by mouth daily. 90 tablet 3  . aspirin 325 MG EC tablet Take 325 mg by mouth daily.      . diazepam (VALIUM) 5 MG tablet TAKE 1 TABLET BY MOUTH TWICE A DAY AS NEEDED 60 tablet 0  . diclofenac sodium (VOLTAREN) 1 % GEL Apply 1 application topically 4 (four) times daily. 1 Tube 11  . fexofenadine (ALLEGRA) 180 MG tablet Take 1 tablet (180 mg total) by mouth daily. 90 tablet 3  . labetalol (NORMODYNE) 200 MG tablet TAKE 1 TABLET BY MOUTH TWICE A DAY 60 tablet 11  . rosuvastatin (CRESTOR) 10 MG tablet Take 1 tablet (10 mg total) by mouth daily. 30 tablet 11  . spironolactone (ALDACTONE) 50 MG tablet TAKE 1 TABLET BY MOUTH TWICE A DAY 60 tablet 11   Current Facility-Administered Medications on File Prior to Visit  Medication Dose Route Frequency Provider Last Rate Last Dose  . Bevacizumab (AVASTIN) SOLN 1.25 mg  1.25 mg Intravitreal  [name], MD   1.25 mg at 11/22/17 1319  . Bevacizumab (AVASTIN) SOLN 1.25 mg  1.25 mg Intravitreal  [name], MD   1.25 mg at 05/08/18 1019  Lab Results  Component Value Date   WBC 6.2 04/19/2018   HGB 12.8 04/19/2018   HCT 38.7 04/19/2018   PLT 140.0 (L) 04/19/2018   GLUCOSE 99 10/18/2018   CHOL 207 (H) 10/18/2018   TRIG 109.0 10/18/2018   HDL 62.60 10/18/2018   LDLDIRECT 121.4 04/10/2013   LDLCALC 123 (H) 10/18/2018   ALT 20 10/18/2018   AST 21 10/18/2018   NA 136 10/18/2018   K 4.5 10/18/2018   CL 103 10/18/2018   CREATININE 1.28 (H) 10/18/2018   BUN 17 10/18/2018   CO2 24 10/18/2018   TSH 1.26 04/19/2018   HGBA1C 6.0 10/18/2018      A/P/next steps:   1)  Hyperglycemia - stable overall by history and exam, recent  data reviewed with pt, and pt to continue medical treatment as before,  to f/u any worsening symptoms or concerns'  2)  CKD - stable overall by history and exam, recent data reviewed with pt, and pt to continue medical treatment as before,  to f/u any worsening symptoms or concerns  3)  HLD - stable overall by history and exam, recent data reviewed with pt, and pt to continue medical treatment as before,  to f/u any worsening symptoms or concerns  Oliver BarreJames Kaytlyn Din MD

## 2018-10-18 NOTE — Patient Instructions (Signed)
Please continue all other medications as before, and refills have been done if requested.  Please have the pharmacy call with any other refills you may need.  Please continue your efforts at being more active, low cholesterol diet, and weight control..  Please keep your appointments with your specialists as you may have planned  Please return in 6 months, or sooner if needed, with Lab testing done 3-5 days before  

## 2018-10-20 ENCOUNTER — Telehealth: Payer: Self-pay | Admitting: Internal Medicine

## 2018-10-20 ENCOUNTER — Other Ambulatory Visit: Payer: Self-pay | Admitting: Internal Medicine

## 2018-10-20 NOTE — Telephone Encounter (Signed)
Patient is requesting call back today with results of labs.  Patient is concerned about cholesterol and blood sugar.

## 2018-11-06 ENCOUNTER — Encounter: Payer: Self-pay | Admitting: Gastroenterology

## 2018-12-08 ENCOUNTER — Other Ambulatory Visit: Payer: Self-pay

## 2018-12-08 ENCOUNTER — Encounter: Payer: Self-pay | Admitting: Podiatry

## 2018-12-08 ENCOUNTER — Ambulatory Visit: Payer: Medicare HMO | Admitting: Podiatry

## 2018-12-08 DIAGNOSIS — M79676 Pain in unspecified toe(s): Secondary | ICD-10-CM | POA: Diagnosis not present

## 2018-12-08 DIAGNOSIS — B351 Tinea unguium: Secondary | ICD-10-CM | POA: Diagnosis not present

## 2018-12-08 MED ORDER — NONFORMULARY OR COMPOUNDED ITEM: 3 refills | Status: DC

## 2018-12-08 NOTE — Patient Instructions (Signed)

## 2018-12-10 NOTE — Progress Notes (Signed)
Subjective: Megan Baldwin is seen today for follow up painful, elongated, thickened toenails 1-5 b/l feet that she cannot cut. Pain interferes with daily activities. Aggravating factor includes wearing enclosed shoe gear and relieved with periodic debridement.  She would like to discuss treatment for her fungal toenails.  Current Outpatient Medications on File Prior to Visit  Medication Sig  . albuterol (PROVENTIL HFA;VENTOLIN HFA) 108 (90 Base) MCG/ACT inhaler Inhale 2 puffs into the lungs every 6 (six) hours as needed for wheezing or shortness of breath.  Marland Kitchen amLODipine (NORVASC) 5 MG tablet Take 1 tablet (5 mg total) by mouth daily.  Marland Kitchen aspirin 325 MG EC tablet Take 325 mg by mouth daily.    . diazepam (VALIUM) 5 MG tablet TAKE 1 TABLET BY MOUTH TWICE A DAY AS NEEDED  . diclofenac sodium (VOLTAREN) 1 % GEL Apply 1 application topically 4 (four) times daily.  . fexofenadine (ALLEGRA) 180 MG tablet Take 1 tablet (180 mg total) by mouth daily.  Marland Kitchen labetalol (NORMODYNE) 200 MG tablet TAKE 1 TABLET BY MOUTH TWICE A DAY  . rosuvastatin (CRESTOR) 10 MG tablet Take 1 tablet (10 mg total) by mouth daily.  Marland Kitchen spironolactone (ALDACTONE) 50 MG tablet TAKE 1 TABLET BY MOUTH TWICE A DAY   Current Facility-Administered Medications on File Prior to Visit  Medication  . Bevacizumab (AVASTIN) SOLN 1.25 mg  . Bevacizumab (AVASTIN) SOLN 1.25 mg     No Known Allergies   Objective:  Vascular Examination: Capillary refill time immediate x 10 digits.  Dorsalis pedis present b/l.  Posterior tibial pulses present b/l.  Digital hair  present x 10 digits.  Skin temperature gradient WNL b/l.   Dermatological Examination: Skin with normal turgor, texture and tone b/l.  Toenails 1-5 b/l discolored, thick, dystrophic with subungual debris and pain with palpation to nailbeds due to thickness of nails.  Musculoskeletal: Muscle strength 5/5 to all LE muscle groups  No gross bony deformities b/l.  No  pain, crepitus or joint limitation noted with ROM.   Neurological Examination: Protective sensation intact with 10 gram monofilament bilaterally.  Epicritic sensation present bilaterally.  Vibratory sensation intact bilaterally.   Assessment: Painful onychomycosis toenails 1-5 b/l   Plan: 1. Toenails 1-5 b/l were debrided in length and girth without iatrogenic bleeding. Discussed treatment options for onychomysosis. Discussed topical, laser and oral medication. Patient opted for topical treatment with compounded medication. Rx written for nonformulary compounding topical antifungal: [facility]: Antifungal cream - Terbinafine 3%, Fluconazole 2%, Tea Tree Oil 5%, Urea 10%, Ibuprofen 2% in DMSO Suspension #101ml. Apply to the affected nail(s) at bedtime. 2. Patient to continue soft, supportive shoe gear. 3. Patient to report any pedal injuries to medical professional immediately. 4. Follow up 3 months.  5. Patient/POA to call should there be a concern in the interim.

## 2019-01-17 ENCOUNTER — Encounter: Payer: Self-pay | Admitting: Internal Medicine

## 2019-01-17 ENCOUNTER — Other Ambulatory Visit: Payer: Self-pay

## 2019-01-17 ENCOUNTER — Ambulatory Visit (INDEPENDENT_AMBULATORY_CARE_PROVIDER_SITE_OTHER): Payer: Medicare HMO | Admitting: Internal Medicine

## 2019-01-17 VITALS — BP 126/78 | HR 75 | Temp 97.6°F | Ht 64.0 in | Wt 188.0 lb

## 2019-01-17 DIAGNOSIS — I10 Essential (primary) hypertension: Secondary | ICD-10-CM | POA: Diagnosis not present

## 2019-01-17 DIAGNOSIS — E785 Hyperlipidemia, unspecified: Secondary | ICD-10-CM

## 2019-01-17 DIAGNOSIS — Z23 Encounter for immunization: Secondary | ICD-10-CM | POA: Diagnosis not present

## 2019-01-17 DIAGNOSIS — F32A Depression, unspecified: Secondary | ICD-10-CM

## 2019-01-17 DIAGNOSIS — R739 Hyperglycemia, unspecified: Secondary | ICD-10-CM

## 2019-01-17 DIAGNOSIS — J449 Chronic obstructive pulmonary disease, unspecified: Secondary | ICD-10-CM | POA: Diagnosis not present

## 2019-01-17 DIAGNOSIS — F329 Major depressive disorder, single episode, unspecified: Secondary | ICD-10-CM | POA: Diagnosis not present

## 2019-01-17 MED ORDER — LABETALOL HCL 200 MG PO TABS: 200.0000 mg | ORAL_TABLET | Freq: Two times a day (BID) | ORAL | 3 refills | Status: DC

## 2019-01-17 MED ORDER — DIAZEPAM 5 MG PO TABS: 5.0000 mg | ORAL_TABLET | Freq: Two times a day (BID) | ORAL | 2 refills | Status: DC | PRN

## 2019-01-17 MED ORDER — SPIRONOLACTONE 50 MG PO TABS: 50.0000 mg | ORAL_TABLET | Freq: Two times a day (BID) | ORAL | 3 refills | Status: DC

## 2019-01-17 NOTE — Progress Notes (Signed)
Subjective:    Patient ID: Megan Baldwin, female    DOB: 1933/06/24, 85 y.o.   MRN: [id number]  HPI  Here to f/u; overall doing ok,  Pt denies chest pain, increasing sob or doe, wheezing, orthopnea, PND, increased LE swelling, palpitations, dizziness or syncope.  Pt denies new neurological symptoms such as new headache, or facial or extremity weakness or numbness.  Pt denies polydipsia, polyuria, or low sugar episode.  Pt states overall good compliance with meds, mostly trying to follow appropriate diet, with wt overall stable,  but little exercise however.  More sad and depressed lately, partly with covid and several persons have died that she knows.   Gets tearful easy.   Past Medical History:  Diagnosis Date  . Allergic rhinitis   . Anxiety   . Aortic valve regurgitation 10/12/2010  . CKD (chronic kidney disease) stage 3, GFR 30-59 ml/min 09/26/2017  . COPD (chronic obstructive pulmonary disease) (HCC)   . Disc disease, degenerative, cervical   . Diverticulosis of colon   . DJD (degenerative joint disease)    LS spine  . Gallstones   . History of colonic polyps   . Hyperlipidemia   . Hypertension   . Osteoporosis    Past Surgical History:  Procedure Laterality Date  . ABDOMINAL HYSTERECTOMY    . CATARACT EXTRACTION    . COLONOSCOPY W/ POLYPECTOMY    . EYE SURGERY    . OOPHORECTOMY      reports that she has quit smoking. She has never used smokeless tobacco. She reports that she does not drink alcohol or use drugs. family history includes Cancer in her mother and another family member; Coronary artery disease in an other family member; Diabetes in her mother and another family member; Hypertension in an other family member; Parkinsonism in her father; Stroke in an other family member. No Known Allergies Current Outpatient Medications on File Prior to Visit  Medication Sig Dispense Refill  . albuterol (PROVENTIL HFA;VENTOLIN HFA) 108 (90 Base) MCG/ACT inhaler Inhale 2 puffs into  the lungs every 6 (six) hours as needed for wheezing or shortness of breath. 1 Inhaler 11  . amLODipine (NORVASC) 5 MG tablet Take 1 tablet (5 mg total) by mouth daily. 90 tablet 3  . aspirin 325 MG EC tablet Take 325 mg by mouth daily.      . diclofenac sodium (VOLTAREN) 1 % GEL Apply 1 application topically 4 (four) times daily. 1 Tube 11  . NONFORMULARY OR COMPOUNDED ITEM Antifungal solution: Terbinafine 3%, Fluconazole 2%, Tea Tree Oil 5%, Urea 10%, Ibuprofen 2% in DMSO suspension #40mL 1 each 3  . rosuvastatin (CRESTOR) 10 MG tablet Take 1 tablet (10 mg total) by mouth daily. 30 tablet 11  . fexofenadine (ALLEGRA) 180 MG tablet Take 1 tablet (180 mg total) by mouth daily. 90 tablet 3   Current Facility-Administered Medications on File Prior to Visit  Medication Dose Route Frequency Provider Last Rate Last Dose  . Bevacizumab (AVASTIN) SOLN 1.25 mg  1.25 mg Intravitreal  Rennis Chris, MD   1.25 mg at 11/22/17 1319  . Bevacizumab (AVASTIN) SOLN 1.25 mg  1.25 mg Intravitreal  Rennis Chris, MD   1.25 mg at 05/08/18 1019   Review of Systems  Constitutional: Negative for other unusual diaphoresis or sweats HENT: Negative for ear discharge or swelling Eyes: Negative for other worsening visual disturbances Respiratory: Negative for stridor or other swelling  Gastrointestinal: Negative for worsening distension or other blood Genitourinary: Negative for retention  or other urinary change Musculoskeletal: Negative for other MSK pain or swelling Skin: Negative for color change or other new lesions Neurological: Negative for worsening tremors and other numbness  Psychiatric/Behavioral: Negative for worsening agitation or other fatigue All otherwise neg per pt     Objective:   Physical Exam BP 126/78   Pulse 75   Temp 97.6 F (36.4 C) (Oral)   Ht 5\' 4"  (1.626 m)   Wt 188 lb (85.3 kg)   SpO2 96%   BMI 32.27 kg/m  VS noted,  Constitutional: Pt appears in NAD HENT: Head: NCAT.  Right  Ear: External ear normal.  Left Ear: External ear normal.  Eyes: . Pupils are equal, round, and reactive to light. Conjunctivae and EOM are normal Nose: without d/c or deformity Neck: Neck supple. Gross normal ROM Cardiovascular: Normal rate and regular rhythm.   Pulmonary/Chest: Effort normal and breath sounds without rales or wheezing.  Abd:  Soft, NT, ND, + BS, no organomegaly Neurological: Pt is alert. At baseline orientation, motor grossly intact Skin: Skin is warm. No rashes, other new lesions, no LE edema Psychiatric: Pt behavior is normal without agitation , sad and mild depressed affect All otherwise neg per pt Lab Results  Component Value Date   WBC 6.2 04/19/2018   HGB 12.8 04/19/2018   HCT 38.7 04/19/2018   PLT 140.0 (L) 04/19/2018   GLUCOSE 99 10/18/2018   CHOL 207 (H) 10/18/2018   TRIG 109.0 10/18/2018   HDL 62.60 10/18/2018   LDLDIRECT 121.4 04/10/2013   LDLCALC 123 (H) 10/18/2018   ALT 20 10/18/2018   AST 21 10/18/2018   NA 136 10/18/2018   K 4.5 10/18/2018   CL 103 10/18/2018   CREATININE 1.28 (H) 10/18/2018   BUN 17 10/18/2018   CO2 24 10/18/2018   TSH 1.26 04/19/2018   HGBA1C 6.0 10/18/2018        Assessment & Plan:

## 2019-01-17 NOTE — Patient Instructions (Addendum)
You had the flu shot today  Ok to take the Tumeric every day as needed  Please continue all other medications as before, and refills have been done if requested.  Please have the pharmacy call with any other refills you may need.  Please continue your efforts at being more active, low cholesterol diet, and weight control.  Please keep your appointments with your specialists as you may have planned  Please return in 6 months, or sooner if needed, with Lab testing done 3-5 days before

## 2019-01-21 ENCOUNTER — Encounter: Payer: Self-pay | Admitting: Internal Medicine

## 2019-01-21 DIAGNOSIS — F32A Depression, unspecified: Secondary | ICD-10-CM | POA: Insufficient documentation

## 2019-01-21 DIAGNOSIS — F329 Major depressive disorder, single episode, unspecified: Secondary | ICD-10-CM | POA: Insufficient documentation

## 2019-01-21 NOTE — Assessment & Plan Note (Signed)
Mild, d/w pt, no SI or HI, declines celexa trial,  to f/u any worsening symptoms or concerns

## 2019-01-21 NOTE — Assessment & Plan Note (Signed)
Uncontrolled, declines statin, for lower chol diet 

## 2019-01-21 NOTE — Assessment & Plan Note (Signed)
stable overall by history and exam, recent data reviewed with pt, and pt to continue medical treatment as before,  to f/u any worsening symptoms or concerns  

## 2019-03-16 ENCOUNTER — Ambulatory Visit: Payer: Medicare HMO | Admitting: Podiatry

## 2019-04-19 ENCOUNTER — Other Ambulatory Visit: Payer: Self-pay | Admitting: Internal Medicine

## 2019-04-19 NOTE — Telephone Encounter (Signed)
Please refill as per office routine med refill policy (all routine meds refilled for 3 mo or monthly per pt preference up to one year from last visit, then month to month grace period for 3 mo, then further med refills will have to be denied)  

## 2019-04-20 NOTE — Telephone Encounter (Signed)
Please refill as per office routine med refill policy (all routine meds refilled for 3 mo or monthly per pt preference up to one year from last visit, then month to month grace period for 3 mo, then further med refills will have to be denied)  

## 2019-04-29 ENCOUNTER — Ambulatory Visit: Payer: Medicare HMO | Attending: Internal Medicine

## 2019-04-29 DIAGNOSIS — Z23 Encounter for immunization: Secondary | ICD-10-CM | POA: Insufficient documentation

## 2019-04-29 NOTE — Progress Notes (Signed)
   Covid-19 Vaccination Clinic  Name:  Megan Baldwin    MRN: [id number] DOB: 03-23-1933  04/29/2019  [name] was observed post Covid-19 immunization for 15 minutes without incidence. She was provided with Vaccine Information Sheet and instruction to access the V-Safe system.   [name] was instructed to call 911 with any severe reactions post vaccine: Marland Kitchen Difficulty breathing  . Swelling of your face and throat  . A fast heartbeat  . A bad rash all over your body  . Dizziness and weakness    Immunizations Administered    Name Date Dose VIS Date Route   Pfizer COVID-19 Vaccine 04/29/2019 10:00 AM 0.3 mL 02/09/2019 Intramuscular   Manufacturer: [employer], [employer]   Lot: [id number]   NDC: [address]-5

## 2019-05-01 ENCOUNTER — Other Ambulatory Visit: Payer: Self-pay

## 2019-05-01 ENCOUNTER — Encounter: Payer: Self-pay | Admitting: Podiatry

## 2019-05-01 ENCOUNTER — Ambulatory Visit: Payer: Medicare HMO | Admitting: Podiatry

## 2019-05-01 VITALS — Temp 96.8°F

## 2019-05-01 DIAGNOSIS — M79676 Pain in unspecified toe(s): Secondary | ICD-10-CM

## 2019-05-01 DIAGNOSIS — B351 Tinea unguium: Secondary | ICD-10-CM | POA: Diagnosis not present

## 2019-05-01 NOTE — Patient Instructions (Addendum)
Apply tea tree oil to toenails once daily.   Onychomycosis/Fungal Toenails  WHAT IS IT? An infection that lies within the keratin of your nail plate that is caused by a fungus.  WHY ME? Fungal infections affect all ages, sexes, races, and creeds.  There may be many factors that predispose you to a fungal infection such as age, coexisting medical conditions such as diabetes, or an autoimmune disease; stress, medications, fatigue, genetics, etc.  Bottom line: fungus thrives in a warm, moist environment and your shoes offer such a location.  IS IT CONTAGIOUS? Theoretically, yes.  You do not want to share shoes, nail clippers or files with someone who has fungal toenails.  Walking around barefoot in the same room or sleeping in the same bed is unlikely to transfer the organism.  It is important to realize, however, that fungus can spread easily from one nail to the next on the same foot.  HOW DO WE TREAT THIS?  There are several ways to treat this condition.  Treatment may depend on many factors such as age, medications, pregnancy, liver and kidney conditions, etc.  It is best to ask your doctor which options are available to you.  1. No treatment.   Unlike many other medical concerns, you can live with this condition.  However for many people this can be a painful condition and may lead to ingrown toenails or a bacterial infection.  It is recommended that you keep the nails cut short to help reduce the amount of fungal nail. 2. Topical treatment.  These range from herbal remedies to prescription strength nail lacquers.  About 40-50% effective, topicals require twice daily application for approximately 9 to 12 months or until an entirely new nail has grown out.  The most effective topicals are medical grade medications available through physicians offices. 3. Oral antifungal medications.  With an 80-90% cure rate, the most common oral medication requires 3 to 4 months of therapy and stays in your system  for a year as the new nail grows out.  Oral antifungal medications do require blood work to make sure it is a safe drug for you.  A liver function panel will be performed prior to starting the medication and after the first month of treatment.  It is important to have the blood work performed to avoid any harmful side effects.  In general, this medication safe but blood work is required. 4. Laser Therapy.  This treatment is performed by applying a specialized laser to the affected nail plate.  This therapy is noninvasive, fast, and non-painful.  It is not covered by insurance and is therefore, out of pocket.  The results have been very good with a 80-95% cure rate.  The [facility] is the only practice in the area to offer this therapy. 5. Permanent Nail Avulsion.  Removing the entire nail so that a new nail will not grow back. 

## 2019-05-06 NOTE — Progress Notes (Signed)
Subjective: Megan Baldwin presents today for follow up of painful mycotic nails b/l that are difficult to trim. Pain interferes with ambulation. Aggravating factors include wearing enclosed shoe gear. Pain is relieved with periodic professional debridement.   She relates she never received medication from [place].   No Known Allergies   Objective: Vitals:   05/01/19 1428  Temp: (!) 96.8 F (36 C)   Pt 86 y.o. y.o.African American female in NAD. AAO x 3.   Vascular Examination:  Capillary refill time to digits immediate b/l. Palpable DP pulses b/l. Palpable PT pulses b/l. Pedal hair present b/l. Skin temperature gradient within normal limits b/l.  Dermatological Examination: Pedal skin with normal turgor, texture and tone bilaterally. No open wounds bilaterally. No interdigital macerations bilaterally. Toenails 1-5 b/l elongated, dystrophic, thickened, crumbly with subungual debris and tenderness to dorsal palpation.  Musculoskeletal: Normal muscle strength 5/5 to all lower extremity muscle groups bilaterally, no gross bony deformities bilaterally and no pain crepitus or joint limitation noted with ROM b/l  Neurological: Protective sensation intact 5/5 intact bilaterally with 10g monofilament b/l Vibratory sensation intact b/l  Assessment: 1. Pain due to onychomycosis of toenail    Plan: -Toenails 1-5 b/l were debrided in length and girth with sterile nail nippers and dremel without iatrogenic bleeding. We discussed other topical treatments for onychomycosis. She may use tea tree oil digit nails once daily. -Patient to continue soft, supportive shoe gear daily. -Patient to report any pedal injuries to medical professional immediately. -Patient/POA to call should there be question/concern in the interim.  Return in about 3 months (around 08/01/2019).

## 2019-05-08 DIAGNOSIS — H25812 Combined forms of age-related cataract, left eye: Secondary | ICD-10-CM | POA: Diagnosis not present

## 2019-05-08 DIAGNOSIS — H35033 Hypertensive retinopathy, bilateral: Secondary | ICD-10-CM | POA: Diagnosis not present

## 2019-05-08 DIAGNOSIS — H3562 Retinal hemorrhage, left eye: Secondary | ICD-10-CM | POA: Diagnosis not present

## 2019-05-08 DIAGNOSIS — H26491 Other secondary cataract, right eye: Secondary | ICD-10-CM | POA: Diagnosis not present

## 2019-05-14 ENCOUNTER — Ambulatory Visit (INDEPENDENT_AMBULATORY_CARE_PROVIDER_SITE_OTHER): Payer: Medicare HMO | Admitting: Ophthalmology

## 2019-05-14 ENCOUNTER — Encounter (INDEPENDENT_AMBULATORY_CARE_PROVIDER_SITE_OTHER): Payer: Self-pay | Admitting: Ophthalmology

## 2019-05-14 DIAGNOSIS — I10 Essential (primary) hypertension: Secondary | ICD-10-CM | POA: Diagnosis not present

## 2019-05-14 DIAGNOSIS — H35012 Changes in retinal vascular appearance, left eye: Secondary | ICD-10-CM

## 2019-05-14 DIAGNOSIS — H35033 Hypertensive retinopathy, bilateral: Secondary | ICD-10-CM | POA: Diagnosis not present

## 2019-05-14 DIAGNOSIS — H3581 Retinal edema: Secondary | ICD-10-CM | POA: Diagnosis not present

## 2019-05-14 DIAGNOSIS — H25812 Combined forms of age-related cataract, left eye: Secondary | ICD-10-CM | POA: Diagnosis not present

## 2019-05-14 DIAGNOSIS — Z961 Presence of intraocular lens: Secondary | ICD-10-CM

## 2019-05-14 DIAGNOSIS — H34832 Tributary (branch) retinal vein occlusion, left eye, with macular edema: Secondary | ICD-10-CM

## 2019-05-14 NOTE — Progress Notes (Addendum)
[facility] Note  05/14/2019     CHIEF COMPLAINT Patient presents for Retina Follow Up   HISTORY OF PRESENT ILLNESS: Megan Baldwin is a 86 y.o. female who presents to the clinic today for:   HPI    Retina Follow Up    Patient presents with  CRVO/BRVO.  In left eye.  This started 1 year ago.  Severity is moderate.  I, the attending physician,  performed the HPI with the patient and updated documentation appropriately.          Comments    Patient here for 7 - 8 weeks (1 yr) retina follow up for BRVO OS. Patient states saw Dr Posey Pronto all ok. Dr saw possible fluid. No eye pain.        Last edited by [name], MD on 05/14/2019  3:24 PM. (History)    pt states she feels like she is doing well, her daughters are not here with her today, pt states she thought she had been released from [name] at last visit  Referring physician: [name], [name] [place],  [place] [address]  HISTORICAL INFORMATION:   Selected notes from the MEDICAL RECORD NUMBER Referred by Dr. [name] for concern of retinal hemorrhage OS with +RAM sup temp LEE: 09.19.19 ([name]) [BCVA: OD: 20/ OS: 20/60-2] Ocular Hx-cataract OS, PCO OD, HTN ret OU, pseudo OD PMH-HTN, anxiety, CKD, COPD, HLD    CURRENT MEDICATIONS: No current outpatient medications on file. (Ophthalmic Drugs)   No current facility-administered medications for this visit. (Ophthalmic Drugs)   Current Outpatient Medications (Other)  Medication Sig  . albuterol (PROVENTIL HFA;VENTOLIN HFA) 108 (90 Base) MCG/ACT inhaler Inhale 2 puffs into the lungs every 6 (six) hours as needed for wheezing or shortness of breath.  Marland Kitchen amLODipine (NORVASC) 5 MG tablet TAKE 1 TABLET EVERY DAY  . aspirin 325 MG EC tablet Take 325 mg by mouth daily.    . diazepam (VALIUM) 5 MG tablet Take 1 tablet (5 mg total) by mouth 2 (two) times daily as needed.  . diclofenac sodium (VOLTAREN) 1 % GEL Apply 1  application topically 4 (four) times daily.  . fexofenadine (ALLEGRA) 180 MG tablet Take 1 tablet (180 mg total) by mouth daily.  Marland Kitchen labetalol (NORMODYNE) 200 MG tablet Take 1 tablet (200 mg total) by mouth 2 (two) times daily.  . rosuvastatin (CRESTOR) 10 MG tablet Take 1 tablet (10 mg total) by mouth daily.  Marland Kitchen spironolactone (ALDACTONE) 50 MG tablet Take 1 tablet (50 mg total) by mouth 2 (two) times daily.   Current Facility-Administered Medications (Other)  Medication Route  . Bevacizumab (AVASTIN) SOLN 1.25 mg Intravitreal  . Bevacizumab (AVASTIN) SOLN 1.25 mg Intravitreal      REVIEW OF SYSTEMS: ROS    Positive for: Gastrointestinal, Genitourinary, Cardiovascular, Eyes, Respiratory, Psychiatric   Negative for: Constitutional, Neurological, Skin, Musculoskeletal, HENT, Endocrine, Allergic/Imm, Heme/Lymph   Last edited by [name], COA on 05/14/2019  2:43 PM. (History)       ALLERGIES No Known Allergies  PAST MEDICAL HISTORY Past Medical History:  Diagnosis Date  . Allergic rhinitis   . Anxiety   . Aortic valve regurgitation 10/12/2010  . CKD (chronic kidney disease) stage 3, GFR 30-59 ml/min 09/26/2017  . COPD (chronic obstructive pulmonary disease) ([facility])   . Disc disease, degenerative, cervical   . Diverticulosis of colon   . DJD (degenerative joint disease)    LS  spine  . Gallstones   . History of colonic polyps   . Hyperlipidemia   . Hypertension   . Osteoporosis    Past Surgical History:  Procedure Laterality Date  . ABDOMINAL HYSTERECTOMY    . CATARACT EXTRACTION    . COLONOSCOPY W/ POLYPECTOMY    . EYE SURGERY    . OOPHORECTOMY      FAMILY HISTORY Family History  Problem Relation Age of Onset  . Stroke Other        F 1st degree relative  . Parkinsonism Father   . Cancer Other        1st degree relative <50  . Coronary artery disease Other        F 1st degree relative <60  . Diabetes Other        1st degree relative  . Hypertension Other    . Diabetes Mother   . Cancer Mother     SOCIAL HISTORY Social History   Tobacco Use  . Smoking status: Former Research scientist (life sciences)  . Smokeless tobacco: Never Used  Substance Use Topics  . Alcohol use: No    Alcohol/week: 0.0 standard drinks  . Drug use: No         OPHTHALMIC EXAM:  Base Eye Exam    Visual Acuity (Snellen - Linear)      Right Left   Dist [place] 20/30 -2 20/30   Dist ph  20/25 -2 NI       Tonometry (Tonopen, 2:38 PM)      Right Left   Pressure 16 12       Pupils      Dark Light Shape React APD   Right 4 3 Round Brisk None   Left 4 3 Round Brisk None       Visual Fields (Counting fingers)      Left Right    Full Full       Extraocular Movement      Right Left    Full, Ortho Full, Ortho       Neuro/Psych    Oriented x3: Yes   Mood/Affect: Normal       Dilation    Both eyes: 1.0% Mydriacyl, 2.5% Phenylephrine @ 2:37 PM        Slit Lamp and Fundus Exam    Slit Lamp Exam      Right Left   Lids/Lashes Dermatochalasis - upper lid Dermatochalasis - upper lid   Conjunctiva/Sclera Melanosis Melanosis   Cornea Arcus, mild Debris in tear film Arcus, mild Debris in tear film   Anterior Chamber Deep and quiet Deep and quiet   Iris Round and dilated, No NVI Round and dilated, No NVI   Lens PC IOL in good position 2-3+ Nuclear sclerosis, 2+ Cortical cataract   Vitreous Vitreous syneresis Vitreous syneresis       Fundus Exam      Right Left   Disc Pink and Sharp, mild cupping Pink and Sharp, +cupping   C/D Ratio 0.6 0.6   Macula Flat, Blunted foveal reflex, Retinal pigment epithelial mottling, No heme or edema Blunted foveal reflex, regressed RAM superotemporal macula, IRF/IRH resolved   Vessels Vascular attenuation, Tortuous, Copper wiring, AV crossing changes Vascular attenuation, Tortuous, focal ST vessels more attenuated and tortuous than the remainder   Periphery Attached Attached, RPE mottling from 0300 to 0430          IMAGING AND PROCEDURES   Imaging and Procedures for '@TODAY'$ @  OCT, Retina - OU - Both  Eyes       Right Eye Quality was good. Central Foveal Thickness: 201. Progression has been stable. Findings include abnormal foveal contour, no IRF, no SRF, retinal drusen  (Broad foveal depression).   Left Eye Quality was good. Central Foveal Thickness: 212. Progression has improved. Findings include intraretinal hyper-reflective material, outer retinal atrophy, no SRF, normal foveal contour, no IRF, vitreomacular adhesion  (Patchy ORA - improved; interval improvement in [facility]).   Notes *Images captured and stored on drive  Diagnosis / Impression:  OD: No IRF/SRF OS: normal foveal contour; Patchy ORA - improved; interval improvement in [facility]   Clinical management:  See below  Abbreviations: NFP - Normal foveal profile. CME - cystoid macular edema. PED - pigment epithelial detachment. IRF - intraretinal fluid. SRF - subretinal fluid. EZ - ellipsoid zone. ERM - epiretinal membrane. ORA - outer retinal atrophy. ORT - outer retinal tubulation. SRHM - subretinal hyper-reflective material         Fluorescein Angiography Optos (Transit OS)       Right Eye   Progression has been stable. Early phase findings include staining, microaneurysm (Rare MA's early/late). Mid/Late phase findings include staining, microaneurysm.   Left Eye   Progression has improved. Early phase findings include vascular perfusion defect, delayed filling (Delayed venous return; focal patch of telangiectatic vessels ST macula). Mid/Late phase findings include microaneurysm, staining (Rare MA's).   Notes Images captured and stored on drive;   Impression: OD - rare MA's; staining OS - persistent delay in filling time and venous return; interval improvement in leakage                  ASSESSMENT/PLAN:    ICD-10-CM   1. Retinal macroaneurysm of left eye  H35.012 Fluorescein Angiography Optos (Transit OS)    CANCELED: Intravitreal  Injection, Pharmacologic Agent - OS - Left Eye  2. Branch retinal vein occlusion of left eye with macular edema  Q11.9417 Fluorescein Angiography Optos (Transit OS)  3. Retinal edema  H35.81 OCT, Retina - OU - Both Eyes  4. Essential hypertension  I10   5. Hypertensive retinopathy of both eyes  H35.033 Fluorescein Angiography Optos (Transit OS)  6. Combined forms of age-related cataract of left eye  H25.812   7. Pseudophakia  Z96.1     1-3. Retinal arterial macroaneurysm and BRVO w/ edema OS  - pt has been lost to f/u for 1 year -- last visit was March 2020 and pt was supposed to return in 7-8 wks  - at initial presentation 9.24.19, OCT macula showed +IRF/SRF  - S/P IVA OS #1 (09.24.19), #2 (10.22.19), #3 (11.25.19), #4 (12.23.19), #5 (01.24.20)  - today BCVA stable at 20/30  - FA (03.15.21) today shows interval improvement in leakage  - OCT with interval improvement in [facility]; IRF and SRF resolved  - recommend holding off on IVA today  - pt in agreement  - F/U 4 months, OCT, DFE  4,5. Hypertensive retinopathy OU  - discussed importance of tight BP control  - monitor   6. Combined form age related cataract OS-   - The symptoms of cataract, surgical options, and treatments and risks were discussed with patient.  - discussed diagnosis and progression  - approaching visual significance  7. Pseudophakia OD  - s/p CE/IOL OD  - doing well  - monitor   Ophthalmic Meds Ordered this visit:  No orders of the defined types were placed in this encounter.      Return in about 4  months (around 09/13/2019) for f/u BRVO OS, DFE, OCT.  There are no Patient Instructions on file for this visit.   Explained the diagnoses, plan, and follow up with the patient and they expressed understanding.  Patient expressed understanding of the importance of proper follow up care.   This document serves as a record of services personally performed by Gardiner Sleeper, MD, PhD. It was created on their  behalf by Ernest Mallick, OA, an ophthalmic assistant. The creation of this record is the provider's dictation and/or activities during the visit.    Electronically signed by: Ernest Mallick, OA  03.05.2020 4:00 PM   Gardiner Sleeper, M.D., Ph.D. Diseases & Surgery of the Retina and Vitreous [facility]  I have reviewed the above documentation for accuracy and completeness, and I agree with the above. Gardiner Sleeper, M.D., Ph.D. 05/14/19 4:00 PM    Abbreviations: M myopia (nearsighted); A astigmatism; H hyperopia (farsighted); P presbyopia; Mrx spectacle prescription;  CTL contact lenses; OD right eye; OS left eye; OU both eyes  XT exotropia; ET esotropia; PEK punctate epithelial keratitis; PEE punctate epithelial erosions; DES dry eye syndrome; MGD meibomian gland dysfunction; ATs artificial tears; PFAT's preservative free artificial tears; [place] nuclear sclerotic cataract; PSC posterior subcapsular cataract; ERM epi-retinal membrane; PVD posterior vitreous detachment; RD retinal detachment; DM diabetes mellitus; DR diabetic retinopathy; NPDR non-proliferative diabetic retinopathy; PDR proliferative diabetic retinopathy; CSME clinically significant macular edema; DME diabetic macular edema; dbh dot blot hemorrhages; CWS cotton wool spot; POAG primary open angle glaucoma; C/D cup-to-disc ratio; HVF humphrey visual field; GVF goldmann visual field; OCT optical coherence tomography; IOP intraocular pressure; BRVO Branch retinal vein occlusion; CRVO central retinal vein occlusion; CRAO central retinal artery occlusion; BRAO branch retinal artery occlusion; RT retinal tear; SB scleral buckle; PPV pars plana vitrectomy; VH Vitreous hemorrhage; PRP panretinal laser photocoagulation; IVK intravitreal kenalog; VMT vitreomacular traction; MH Macular hole;  NVD neovascularization of the disc; NVE neovascularization elsewhere; AREDS age related eye disease study; ARMD age related macular  degeneration; POAG primary open angle glaucoma; EBMD epithelial/anterior basement membrane dystrophy; ACIOL anterior chamber intraocular lens; IOL intraocular lens; PCIOL posterior chamber intraocular lens; Phaco/IOL phacoemulsification with intraocular lens placement; [place] photorefractive keratectomy; LASIK laser assisted in situ keratomileusis; HTN hypertension; DM diabetes mellitus; COPD chronic obstructive pulmonary disease

## 2019-05-23 ENCOUNTER — Ambulatory Visit: Payer: Medicare HMO | Attending: Internal Medicine

## 2019-05-23 DIAGNOSIS — Z23 Encounter for immunization: Secondary | ICD-10-CM

## 2019-05-23 NOTE — Progress Notes (Signed)
   Covid-19 Vaccination Clinic  Name:  Megan Baldwin    MRN: [id number] DOB: May 24, 1933  05/23/2019  [name] was observed post Covid-19 immunization for 15 minutes without incident. She was provided with Vaccine Information Sheet and instruction to access the V-Safe system.   [name] was instructed to call 911 with any severe reactions post vaccine: Marland Kitchen Difficulty breathing  . Swelling of face and throat  . A fast heartbeat  . A bad rash all over body  . Dizziness and weakness   Immunizations Administered    Name Date Dose VIS Date Route   Pfizer COVID-19 Vaccine 05/23/2019 11:44 AM 0.3 mL 02/09/2019 Intramuscular   Manufacturer: [employer], [employer]   Lot: [id number]   NDC: [address]-3

## 2019-06-12 ENCOUNTER — Ambulatory Visit: Payer: Medicare HMO | Admitting: Internal Medicine

## 2019-08-17 ENCOUNTER — Other Ambulatory Visit: Payer: Self-pay | Admitting: Internal Medicine

## 2019-08-19 ENCOUNTER — Other Ambulatory Visit: Payer: Self-pay | Admitting: Internal Medicine

## 2019-09-14 ENCOUNTER — Other Ambulatory Visit: Payer: Self-pay

## 2019-09-14 ENCOUNTER — Encounter (INDEPENDENT_AMBULATORY_CARE_PROVIDER_SITE_OTHER): Payer: Medicare HMO | Admitting: Ophthalmology

## 2019-09-21 NOTE — Progress Notes (Addendum)
[facility] Note  09/24/2019     CHIEF COMPLAINT Patient presents for Retina Follow Up   HISTORY OF PRESENT ILLNESS: Megan Baldwin is a 86 y.o. female who presents to the clinic today for:   HPI    Retina Follow Up    Patient presents with  CRVO/BRVO.  In left eye.  Duration of 4 months.  Since onset it is stable.  I, the attending physician,  performed the HPI with the patient and updated documentation appropriately.          Comments    4 month follow up BRVO OS- Vision appears stable since last visit OU.         Last edited by [name], MD on 09/24/2019  1:32 PM. (History)      Referring physician: Biagio Borg, MD [place],  [place] [address]  HISTORICAL INFORMATION:   Selected notes from the MEDICAL RECORD NUMBER Referred by Dr. [name] for concern of retinal hemorrhage OS with +RAM sup temp LEE: 09.19.19 ([name]) [BCVA: OD: 20/ OS: 20/60-2] Ocular Hx-cataract OS, PCO OD, HTN ret OU, pseudo OD PMH-HTN, anxiety, CKD, COPD, HLD    CURRENT MEDICATIONS: No current outpatient medications on file. (Ophthalmic Drugs)   No current facility-administered medications for this visit. (Ophthalmic Drugs)   Current Outpatient Medications (Other)  Medication Sig   albuterol (PROVENTIL HFA;VENTOLIN HFA) 108 (90 Base) MCG/ACT inhaler Inhale 2 puffs into the lungs every 6 (six) hours as needed for wheezing or shortness of breath.   amLODipine (NORVASC) 5 MG tablet TAKE 1 TABLET EVERY DAY   aspirin 325 MG EC tablet Take 325 mg by mouth daily.     diazepam (VALIUM) 5 MG tablet TAKE 1 TABLET BY MOUTH 2 TIMES DAILY AS NEEDED.   diclofenac sodium (VOLTAREN) 1 % GEL Apply 1 application topically 4 (four) times daily.   fexofenadine (ALLEGRA) 180 MG tablet Take 1 tablet (180 mg total) by mouth daily.   labetalol (NORMODYNE) 200 MG tablet Take 1 tablet (200 mg total) by mouth 2 (two) times daily.   rosuvastatin (CRESTOR)  10 MG tablet Take 1 tablet (10 mg total) by mouth daily.   spironolactone (ALDACTONE) 50 MG tablet Take 1 tablet (50 mg total) by mouth 2 (two) times daily.   Current Facility-Administered Medications (Other)  Medication Route   Bevacizumab (AVASTIN) SOLN 1.25 mg Intravitreal   Bevacizumab (AVASTIN) SOLN 1.25 mg Intravitreal      REVIEW OF SYSTEMS: ROS    Positive for: Gastrointestinal, Genitourinary, Musculoskeletal, HENT, Cardiovascular, Eyes, Respiratory, Psychiatric, Allergic/Imm   Negative for: Constitutional, Neurological, Skin, Endocrine, Heme/Lymph   Last edited by Leonie Douglas, COA on 09/24/2019  1:00 PM. (History)       ALLERGIES No Known Allergies  PAST MEDICAL HISTORY Past Medical History:  Diagnosis Date   Allergic rhinitis    Anxiety    Aortic valve regurgitation 10/12/2010   CKD (chronic kidney disease) stage 3, GFR 30-59 ml/min 09/26/2017   COPD (chronic obstructive pulmonary disease) (HCC)    Disc disease, degenerative, cervical    Diverticulosis of colon    DJD (degenerative joint disease)    LS spine   Gallstones    History of colonic polyps    Hyperlipidemia    Hypertension    Osteoporosis    Past Surgical History:  Procedure Laterality Date   ABDOMINAL HYSTERECTOMY     CATARACT EXTRACTION     COLONOSCOPY W/ POLYPECTOMY  EYE SURGERY     OOPHORECTOMY      FAMILY HISTORY Family History  Problem Relation Age of Onset   Stroke Other        F 1st degree relative   Parkinsonism Father    Cancer Other        1st degree relative <50   Coronary artery disease Other        F 1st degree relative <60   Diabetes Other        1st degree relative   Hypertension Other    Diabetes Mother    Cancer Mother     SOCIAL HISTORY Social History   Tobacco Use   Smoking status: Former Smoker   Smokeless tobacco: Never Used  Scientific laboratory technician Use: Never used  Substance Use Topics   Alcohol use: No     Alcohol/week: 0.0 standard drinks   Drug use: No         OPHTHALMIC EXAM:  Base Eye Exam    Visual Acuity (Snellen - Linear)      Right Left   Dist [place] 20/25 20/30   Dist ph [place] NI NI       Tonometry (Tonopen, 1:09 PM)      Right Left   Pressure 16 17       Pupils      Dark Light Shape React APD   Right 4 3 Round Brisk None   Left 4 3 Round Brisk None       Visual Fields (Counting fingers)      Left Right    Full Full       Extraocular Movement      Right Left    Full Full       Neuro/Psych    Oriented x3: Yes   Mood/Affect: Normal       Dilation    Both eyes: 1.0% Mydriacyl, 2.5% Phenylephrine @ 1:09 PM        Slit Lamp and Fundus Exam    Slit Lamp Exam      Right Left   Lids/Lashes Dermatochalasis - upper lid Dermatochalasis - upper lid   Conjunctiva/Sclera Melanosis Melanosis   Cornea Arcus, mild Debris in tear film Arcus, mild Debris in tear film   Anterior Chamber Deep and quiet Deep and quiet   Iris Round and dilated, No NVI Round and dilated, No NVI   Lens PC IOL in good position 2-3+ Nuclear sclerosis, 2+ Cortical cataract   Vitreous Vitreous syneresis Vitreous syneresis       Fundus Exam      Right Left   Disc Pink and Sharp, mild cupping Pink and Sharp, +cupping   C/D Ratio 0.6 0.6   Macula Flat, Blunted foveal reflex, Retinal pigment epithelial mottling, No heme or edema Blunted foveal reflex, regressed RAM superotemporal macula, IRF/IRH resolved   Vessels Vascular attenuation, Tortuous, Copper wiring, AV crossing changes Vascular attenuation, Tortuous, focal ST vessels more attenuated and tortuous than the remainder   Periphery Attached Attached, RPE mottling from 0300 to 0430          IMAGING AND PROCEDURES  Imaging and Procedures for '@TODAY'$ @  OCT, Retina - OU - Both Eyes       Right Eye Quality was good. Central Foveal Thickness: 203. Progression has been stable. Findings include abnormal foveal contour, no IRF, no SRF,  retinal drusen  (Broad foveal depression - stable from prior).   Left Eye Quality was good. Central Foveal Thickness:  213. Progression has been stable. Findings include intraretinal hyper-reflective material, outer retinal atrophy, no SRF, normal foveal contour, no IRF, vitreomacular adhesion  (Patchy ORA - persistent; stable improvement in [facility]).   Notes *Images captured and stored on drive  Diagnosis / Impression:  OD: No IRF/SRF; broad foveal depression -- stable OS: normal foveal contour; no IRF/SRF; Patchy ORA - persistent; stable improvement in [facility]  Clinical management:  See below  Abbreviations: NFP - Normal foveal profile. CME - cystoid macular edema. PED - pigment epithelial detachment. IRF - intraretinal fluid. SRF - subretinal fluid. EZ - ellipsoid zone. ERM - epiretinal membrane. ORA - outer retinal atrophy. ORT - outer retinal tubulation. SRHM - subretinal hyper-reflective material                  ASSESSMENT/PLAN:    ICD-10-CM   1. Retinal macroaneurysm of left eye  H35.012   2. Branch retinal vein occlusion of left eye with macular edema  H34.8320   3. Retinal edema  H35.81 OCT, Retina - OU - Both Eyes  4. Essential hypertension  I10   5. Hypertensive retinopathy of both eyes  H35.033   6. Combined forms of age-related cataract of left eye  H25.812   7. Pseudophakia  Z96.1     1-3. Retinal arterial macroaneurysm and BRVO w/ edema OS  - pt has been lost to f/u for 1 year -- last visit was March 2020 and pt was supposed to return in 7-8 wks  - at initial presentation 9.24.19, OCT macula showed +IRF/SRF  - S/P IVA OS #1 (09.24.19), #2 (10.22.19), #3 (11.25.19), #4 (12.23.19), #5 (01.24.20)  - today BCVA stable at 20/30  - FA (03.15.21) shows interval improvement in leakage  - OCT with interval improvement in [facility]; IRF and SRF resolved  - recommend holding off on IVA again today  - pt in agreement  - F/U 4 months, OCT, DFE  4,5. Hypertensive retinopathy  OU  - discussed importance of tight BP control  - monitor  6. Combined form age related cataract OS-   - The symptoms of cataract, surgical options, and treatments and risks were discussed with patient.  - discussed diagnosis and progression  - approaching visual significance  7. Pseudophakia OD  - s/p CE/IOL OD  - doing well  - monitor  Ophthalmic Meds Ordered this visit:  No orders of the defined types were placed in this encounter.      Return in about 4 months (around 01/25/2020) for BRVO OS - , Dilated Exam, OCT.  There are no Patient Instructions on file for this visit.   Explained the diagnoses, plan, and follow up with the patient and they expressed understanding.  Patient expressed understanding of the importance of proper follow up care.   This document serves as a record of services personally performed by Gardiner Sleeper, MD, PhD. It was created on their behalf by Estill Bakes, COT an ophthalmic technician. The creation of this record is the provider's dictation and/or activities during the visit.    Electronically signed by: Estill Bakes, COT 09/21/19 @ 10:53 PM   This document serves as a record of services personally performed by Gardiner Sleeper, MD, PhD. It was created on their behalf by Estill Bakes, COT an ophthalmic technician. The creation of this record is the provider's dictation and/or activities during the visit.    Electronically signed by: Estill Bakes, COT 09/24/19 @ 10:53 PM  Gardiner Sleeper, M.D., Ph.D. Diseases &  Surgery of the Retina and Vitreous [facility] 09/24/2019   I have reviewed the above documentation for accuracy and completeness, and I agree with the above. Gardiner Sleeper, M.D., Ph.D. 09/24/19 10:53 PM   Abbreviations: M myopia (nearsighted); A astigmatism; H hyperopia (farsighted); P presbyopia; Mrx spectacle prescription;  CTL contact lenses; OD right eye; OS left eye; OU both eyes  XT exotropia; ET  esotropia; PEK punctate epithelial keratitis; PEE punctate epithelial erosions; DES dry eye syndrome; MGD meibomian gland dysfunction; ATs artificial tears; PFAT's preservative free artificial tears; [place] nuclear sclerotic cataract; PSC posterior subcapsular cataract; ERM epi-retinal membrane; PVD posterior vitreous detachment; RD retinal detachment; DM diabetes mellitus; DR diabetic retinopathy; NPDR non-proliferative diabetic retinopathy; PDR proliferative diabetic retinopathy; CSME clinically significant macular edema; DME diabetic macular edema; dbh dot blot hemorrhages; CWS cotton wool spot; POAG primary open angle glaucoma; C/D cup-to-disc ratio; HVF humphrey visual field; GVF goldmann visual field; OCT optical coherence tomography; IOP intraocular pressure; BRVO Branch retinal vein occlusion; CRVO central retinal vein occlusion; CRAO central retinal artery occlusion; BRAO branch retinal artery occlusion; RT retinal tear; SB scleral buckle; PPV pars plana vitrectomy; VH Vitreous hemorrhage; PRP panretinal laser photocoagulation; IVK intravitreal kenalog; VMT vitreomacular traction; MH Macular hole;  NVD neovascularization of the disc; NVE neovascularization elsewhere; AREDS age related eye disease study; ARMD age related macular degeneration; POAG primary open angle glaucoma; EBMD epithelial/anterior basement membrane dystrophy; ACIOL anterior chamber intraocular lens; IOL intraocular lens; PCIOL posterior chamber intraocular lens; Phaco/IOL phacoemulsification with intraocular lens placement; [place] photorefractive keratectomy; LASIK laser assisted in situ keratomileusis; HTN hypertension; DM diabetes mellitus; COPD chronic obstructive pulmonary disease

## 2019-09-24 ENCOUNTER — Encounter (INDEPENDENT_AMBULATORY_CARE_PROVIDER_SITE_OTHER): Payer: Self-pay | Admitting: Ophthalmology

## 2019-09-24 ENCOUNTER — Other Ambulatory Visit: Payer: Self-pay

## 2019-09-24 ENCOUNTER — Ambulatory Visit (INDEPENDENT_AMBULATORY_CARE_PROVIDER_SITE_OTHER): Payer: Medicare HMO | Admitting: Ophthalmology

## 2019-09-24 DIAGNOSIS — H35012 Changes in retinal vascular appearance, left eye: Secondary | ICD-10-CM

## 2019-09-24 DIAGNOSIS — Z961 Presence of intraocular lens: Secondary | ICD-10-CM | POA: Diagnosis not present

## 2019-09-24 DIAGNOSIS — H3581 Retinal edema: Secondary | ICD-10-CM | POA: Diagnosis not present

## 2019-09-24 DIAGNOSIS — H25812 Combined forms of age-related cataract, left eye: Secondary | ICD-10-CM | POA: Diagnosis not present

## 2019-09-24 DIAGNOSIS — H34832 Tributary (branch) retinal vein occlusion, left eye, with macular edema: Secondary | ICD-10-CM

## 2019-09-24 DIAGNOSIS — H35033 Hypertensive retinopathy, bilateral: Secondary | ICD-10-CM

## 2019-09-24 DIAGNOSIS — I10 Essential (primary) hypertension: Secondary | ICD-10-CM

## 2020-03-25 NOTE — Progress Notes (Shared)
[facility] Note  03/26/2020     CHIEF COMPLAINT Patient presents for No chief complaint on file.   HISTORY OF PRESENT ILLNESS: Megan Baldwin is a 87 y.o. female who presents to the clinic today for:     Referring physician: Biagio Borg, MD [place],  [place] [address]  HISTORICAL INFORMATION:   Selected notes from the MEDICAL RECORD NUMBER Referred by Dr. [name] for concern of retinal hemorrhage OS with +RAM sup temp LEE: 09.19.19 ([name]) [BCVA: OD: 20/ OS: 20/60-2] Ocular Hx-cataract OS, PCO OD, HTN ret OU, pseudo OD PMH-HTN, anxiety, CKD, COPD, HLD    CURRENT MEDICATIONS: No current outpatient medications on file. (Ophthalmic Drugs)   No current facility-administered medications for this visit. (Ophthalmic Drugs)   Current Outpatient Medications (Other)  Medication Sig  . albuterol (PROVENTIL HFA;VENTOLIN HFA) 108 (90 Base) MCG/ACT inhaler Inhale 2 puffs into the lungs every 6 (six) hours as needed for wheezing or shortness of breath.  Marland Kitchen amLODipine (NORVASC) 5 MG tablet TAKE 1 TABLET EVERY DAY  . aspirin 325 MG EC tablet Take 325 mg by mouth daily.    . diazepam (VALIUM) 5 MG tablet TAKE 1 TABLET BY MOUTH 2 TIMES DAILY AS NEEDED.  Marland Kitchen diclofenac sodium (VOLTAREN) 1 % GEL Apply 1 application topically 4 (four) times daily.  . fexofenadine (ALLEGRA) 180 MG tablet Take 1 tablet (180 mg total) by mouth daily.  Marland Kitchen labetalol (NORMODYNE) 200 MG tablet Take 1 tablet (200 mg total) by mouth 2 (two) times daily.  . rosuvastatin (CRESTOR) 10 MG tablet Take 1 tablet (10 mg total) by mouth daily.  Marland Kitchen spironolactone (ALDACTONE) 50 MG tablet Take 1 tablet (50 mg total) by mouth 2 (two) times daily.   Current Facility-Administered Medications (Other)  Medication Route  . Bevacizumab (AVASTIN) SOLN 1.25 mg Intravitreal  . Bevacizumab (AVASTIN) SOLN 1.25 mg Intravitreal      REVIEW OF SYSTEMS:    ALLERGIES No Known  Allergies  PAST MEDICAL HISTORY Past Medical History:  Diagnosis Date  . Allergic rhinitis   . Anxiety   . Aortic valve regurgitation 10/12/2010  . CKD (chronic kidney disease) stage 3, GFR 30-59 ml/min 09/26/2017  . COPD (chronic obstructive pulmonary disease) ([place])   . Disc disease, degenerative, cervical   . Diverticulosis of colon   . DJD (degenerative joint disease)    LS spine  . Gallstones   . History of colonic polyps   . Hyperlipidemia   . Hypertension   . Osteoporosis    Past Surgical History:  Procedure Laterality Date  . ABDOMINAL HYSTERECTOMY    . CATARACT EXTRACTION    . COLONOSCOPY W/ POLYPECTOMY    . EYE SURGERY    . OOPHORECTOMY      FAMILY HISTORY Family History  Problem Relation Age of Onset  . Stroke Other        F 1st degree relative  . Parkinsonism Father   . Cancer Other        1st degree relative <50  . Coronary artery disease Other        F 1st degree relative <60  . Diabetes Other        1st degree relative  . Hypertension Other   . Diabetes Mother   . Cancer Mother     SOCIAL HISTORY Social History   Tobacco Use  . Smoking status: Former Research scientist (life sciences)  . Smokeless tobacco: Never Used  Vaping Use  .  Vaping Use: Never used  Substance Use Topics  . Alcohol use: No    Alcohol/week: 0.0 standard drinks  . Drug use: No         OPHTHALMIC EXAM:  Not recorded     IMAGING AND PROCEDURES  Imaging and Procedures for $RemoveBefore'@TODAY'gdoBJRGjQPHBy$ @           ASSESSMENT/PLAN:  No diagnosis found.  1-3. Retinal arterial macroaneurysm and BRVO w/ edema OS  - pt has been lost to f/u for 1 year -- last visit was March 2020 and pt was supposed to return in 7-8 wks  - at initial presentation 9.24.19, OCT macula showed +IRF/SRF  - S/P IVA OS #1 (09.24.19), #2 (10.22.19), #3 (11.25.19), #4 (12.23.19), #5 (01.24.20)  - today BCVA stable at 20/30  - FA (03.15.21) shows interval improvement in leakage  - OCT with interval improvement in [facility]; IRF and SRF  resolved  - recommend holding off on IVA again today  - pt in agreement  - F/U 4 months, OCT, DFE  4,5. Hypertensive retinopathy OU  - discussed importance of tight BP control  - monitor  6. Combined form age related cataract OS-   - The symptoms of cataract, surgical options, and treatments and risks were discussed with patient.  - discussed diagnosis and progression  - approaching visual significance   7. Pseudophakia OD  - s/p CE/IOL OD  - doing well  - monitor  Ophthalmic Meds Ordered this visit:  No orders of the defined types were placed in this encounter.      No follow-ups on file.  There are no Patient Instructions on file for this visit.   Explained the diagnoses, plan, and follow up with the patient and they expressed understanding.  Patient expressed understanding of the importance of proper follow up care.   This document serves as a record of services personally performed by Gardiner Sleeper, MD, PhD. It was created on their behalf by Roselee Nova, COMT. The creation of this record is the provider's dictation and/or activities during the visit.  Electronically signed by: Roselee Nova, COMT 03/25/20 11:03 AM    Gardiner Sleeper, M.D., Ph.D. Diseases & Surgery of the Retina and [place] 09/24/2019     Abbreviations: M myopia (nearsighted); A astigmatism; H hyperopia (farsighted); P presbyopia; Mrx spectacle prescription;  CTL contact lenses; OD right eye; OS left eye; OU both eyes  XT exotropia; ET esotropia; PEK punctate epithelial keratitis; PEE punctate epithelial erosions; DES dry eye syndrome; MGD meibomian gland dysfunction; ATs artificial tears; PFAT's preservative free artificial tears; [place] nuclear sclerotic cataract; PSC posterior subcapsular cataract; ERM epi-retinal membrane; PVD posterior vitreous detachment; RD retinal detachment; DM diabetes mellitus; DR diabetic retinopathy; NPDR non-proliferative diabetic  retinopathy; PDR proliferative diabetic retinopathy; CSME clinically significant macular edema; DME diabetic macular edema; dbh dot blot hemorrhages; CWS cotton wool spot; POAG primary open angle glaucoma; C/D cup-to-disc ratio; HVF humphrey visual field; GVF goldmann visual field; OCT optical coherence tomography; IOP intraocular pressure; BRVO Branch retinal vein occlusion; CRVO central retinal vein occlusion; CRAO central retinal artery occlusion; BRAO branch retinal artery occlusion; RT retinal tear; SB scleral buckle; PPV pars plana vitrectomy; VH Vitreous hemorrhage; PRP panretinal laser photocoagulation; IVK intravitreal kenalog; VMT vitreomacular traction; MH Macular hole;  NVD neovascularization of the disc; NVE neovascularization elsewhere; AREDS age related eye disease study; ARMD age related macular degeneration; POAG primary open angle glaucoma; EBMD epithelial/anterior basement membrane dystrophy; ACIOL anterior chamber intraocular  lens; IOL intraocular lens; PCIOL posterior chamber intraocular lens; Phaco/IOL phacoemulsification with intraocular lens placement; [place] photorefractive keratectomy; LASIK laser assisted in situ keratomileusis; HTN hypertension; DM diabetes mellitus; COPD chronic obstructive pulmonary disease

## 2020-03-26 ENCOUNTER — Encounter (INDEPENDENT_AMBULATORY_CARE_PROVIDER_SITE_OTHER): Payer: Medicare HMO | Admitting: Ophthalmology

## 2020-03-26 DIAGNOSIS — H34832 Tributary (branch) retinal vein occlusion, left eye, with macular edema: Secondary | ICD-10-CM

## 2020-03-26 DIAGNOSIS — Z961 Presence of intraocular lens: Secondary | ICD-10-CM

## 2020-03-26 DIAGNOSIS — H25812 Combined forms of age-related cataract, left eye: Secondary | ICD-10-CM

## 2020-03-26 DIAGNOSIS — H35012 Changes in retinal vascular appearance, left eye: Secondary | ICD-10-CM

## 2020-03-26 DIAGNOSIS — I10 Essential (primary) hypertension: Secondary | ICD-10-CM

## 2020-03-26 DIAGNOSIS — H3581 Retinal edema: Secondary | ICD-10-CM

## 2020-03-26 DIAGNOSIS — H35033 Hypertensive retinopathy, bilateral: Secondary | ICD-10-CM

## 2020-03-31 ENCOUNTER — Telehealth: Payer: Self-pay | Admitting: Internal Medicine

## 2020-03-31 NOTE — Telephone Encounter (Signed)
LVM for pt to rtn my call to schedule awv with nha. Please schedule awv if pt call the office.

## 2020-04-13 ENCOUNTER — Other Ambulatory Visit: Payer: Self-pay | Admitting: Internal Medicine

## 2020-04-14 NOTE — Telephone Encounter (Signed)
Please refill as per office routine med refill policy (all routine meds refilled for 3 mo or monthly per pt preference up to one year from last visit, then month to month grace period for 3 mo, then further med refills will have to be denied)  

## 2020-04-15 ENCOUNTER — Other Ambulatory Visit: Payer: Self-pay

## 2020-04-15 MED ORDER — SPIRONOLACTONE 50 MG PO TABS: 50.0000 mg | ORAL_TABLET | Freq: Two times a day (BID) | ORAL | 0 refills | Status: DC

## 2020-04-15 MED ORDER — AMLODIPINE BESYLATE 5 MG PO TABS: 5.0000 mg | ORAL_TABLET | Freq: Every day | ORAL | 0 refills | Status: DC

## 2020-04-15 MED ORDER — LABETALOL HCL 200 MG PO TABS: 200.0000 mg | ORAL_TABLET | Freq: Two times a day (BID) | ORAL | 0 refills | Status: DC

## 2020-04-15 NOTE — Telephone Encounter (Signed)
Received hard copy fax for refills on Labetalol, Amplodipine, Spironolactone request denied patient needs an in office visit.

## 2020-04-17 ENCOUNTER — Telehealth: Payer: Self-pay | Admitting: Internal Medicine

## 2020-04-17 NOTE — Telephone Encounter (Signed)
30 day supply sent to local pharmacy.  She needs OV for future refills.

## 2020-04-17 NOTE — Telephone Encounter (Signed)
labetalol (NORMODYNE) 200 MG tablet amLODipine (NORVASC) 5 MG tablet spironolactone (ALDACTONE) 50 MG tablet [facility] - [place], [place] - [address] Phone:  [phone]  Fax:  [phone]     humana calling requesting refills for these medications

## 2020-04-17 NOTE — Telephone Encounter (Signed)
Spoke with patient and offered to make an appointment but she said she would call back later to schedule.  I told her she would need to be seen for any future refills.  Patient voiced understanding.

## 2020-04-23 ENCOUNTER — Other Ambulatory Visit: Payer: Self-pay

## 2020-04-26 ENCOUNTER — Other Ambulatory Visit: Payer: Self-pay | Admitting: Internal Medicine

## 2020-04-27 NOTE — Telephone Encounter (Signed)
Please refill as per office routine med refill policy (all routine meds refilled for 3 mo or monthly per pt preference up to one year from last visit, then month to month grace period for 3 mo, then further med refills will have to be denied)  

## 2020-04-29 NOTE — Telephone Encounter (Signed)
Medication request denied pt needs office visit states she was aware and would call back later to schedule.

## 2020-05-11 ENCOUNTER — Telehealth: Payer: Self-pay | Admitting: Internal Medicine

## 2020-05-11 NOTE — Telephone Encounter (Signed)
Please refill as per office routine med refill policy (all routine meds refilled for 3 mo or monthly per pt preference up to one year from last visit, then month to month grace period for 3 mo, then further med refills will have to be denied)  

## 2020-05-20 NOTE — Telephone Encounter (Signed)
Daughter calling to request short supply of medications until 3/29 appointment  Please call Lenix Benoist [phone]

## 2020-05-22 NOTE — Telephone Encounter (Signed)
Daughter calling to request short supply of medications until 3/29 appointment  Please call Lujean Ebright [phone]  [id number] - [place], [place] - [address]

## 2020-05-23 ENCOUNTER — Other Ambulatory Visit: Payer: Self-pay

## 2020-05-23 MED ORDER — LABETALOL HCL 200 MG PO TABS: 200.0000 mg | ORAL_TABLET | Freq: Two times a day (BID) | ORAL | 0 refills | Status: DC

## 2020-05-23 MED ORDER — AMLODIPINE BESYLATE 5 MG PO TABS: 5.0000 mg | ORAL_TABLET | Freq: Every day | ORAL | 0 refills | Status: DC

## 2020-05-26 ENCOUNTER — Other Ambulatory Visit: Payer: Self-pay

## 2020-05-27 ENCOUNTER — Encounter: Payer: Self-pay | Admitting: Internal Medicine

## 2020-05-27 ENCOUNTER — Ambulatory Visit (INDEPENDENT_AMBULATORY_CARE_PROVIDER_SITE_OTHER): Payer: Medicare HMO | Admitting: Internal Medicine

## 2020-05-27 VITALS — BP 128/70 | HR 63 | Temp 98.0°F | Ht 64.0 in | Wt 179.0 lb

## 2020-05-27 DIAGNOSIS — Z0001 Encounter for general adult medical examination with abnormal findings: Secondary | ICD-10-CM

## 2020-05-27 DIAGNOSIS — J449 Chronic obstructive pulmonary disease, unspecified: Secondary | ICD-10-CM

## 2020-05-27 DIAGNOSIS — Z0000 Encounter for general adult medical examination without abnormal findings: Secondary | ICD-10-CM

## 2020-05-27 DIAGNOSIS — N1831 Chronic kidney disease, stage 3a: Secondary | ICD-10-CM | POA: Diagnosis not present

## 2020-05-27 DIAGNOSIS — E538 Deficiency of other specified B group vitamins: Secondary | ICD-10-CM | POA: Diagnosis not present

## 2020-05-27 DIAGNOSIS — I10 Essential (primary) hypertension: Secondary | ICD-10-CM | POA: Diagnosis not present

## 2020-05-27 DIAGNOSIS — R739 Hyperglycemia, unspecified: Secondary | ICD-10-CM

## 2020-05-27 DIAGNOSIS — R634 Abnormal weight loss: Secondary | ICD-10-CM | POA: Diagnosis not present

## 2020-05-27 DIAGNOSIS — E785 Hyperlipidemia, unspecified: Secondary | ICD-10-CM | POA: Diagnosis not present

## 2020-05-27 DIAGNOSIS — I351 Nonrheumatic aortic (valve) insufficiency: Secondary | ICD-10-CM

## 2020-05-27 NOTE — Patient Instructions (Signed)

## 2020-05-27 NOTE — Progress Notes (Signed)
Patient ID: Megan Baldwin, female   DOB: 09-Aug-1933, 87 y.o.   MRN: [id number]         Chief Complaint:: wellness exam and aortic insufficiency, ckd, hyperglycemia, hld       HPI:  Megan Baldwin is a 87 y.o. female here for wellness exam; declines flu shot and tdap, o/w up to date with preventive referrals and immunizations.                         Also Pt denies chest pain, increased sob or doe, wheezing, orthopnea, PND, increased LE swelling, palpitations, dizziness or syncope.   Pt denies polydipsia, polyuria, .  Pt states overall good compliance with meds, trying to follow lower cholesterol, diabetic diet, wt overall down 15 lbs from fe 2020, but little exercise however, just less appetite and calories but not clear why. Denies new focal neuro s/s.   Pt denies fever, night sweats, loss of appetite, or other constitutional symptoms.  No other new complaints  Wt Readings from Last 3 Encounters:  05/27/20 179 lb (81.2 kg)  01/17/19 188 lb (85.3 kg)  04/19/18 194 lb (88 kg)   BP Readings from Last 3 Encounters:  05/27/20 128/70  01/17/19 126/78  04/19/18 136/84   Immunization History  Administered Date(s) Administered  . Fluad Quad(high Dose 65+) 01/17/2019  . Influenza Split 01/18/2011, 11/25/2011  . Influenza Whole 12/07/2007, 12/10/2008  . Influenza, High Dose Seasonal PF 01/03/2015, 12/04/2015, 12/14/2016, 12/27/2017  . Influenza,inj,Quad PF,6+ Mos 11/29/2012, 12/19/2013  . PFIZER(Purple Top)SARS-COV-2 Vaccination 04/29/2019, 05/23/2019, 01/31/2020  . Pneumococcal Conjugate-13 12/21/2012  . Pneumococcal Polysaccharide-23 11/30/2006  . Td 03/01/1997, 08/09/2008   Health Maintenance Due  Topic Date Due  . URINE MICROALBUMIN  Never done      Past Medical History:  Diagnosis Date  . Allergic rhinitis   . Anxiety   . Aortic valve regurgitation 10/12/2010  . CKD (chronic kidney disease) stage 3, GFR 30-59 ml/min (HCC) 09/26/2017  . COPD (chronic obstructive pulmonary disease)  (HCC)   . Disc disease, degenerative, cervical   . Diverticulosis of colon   . DJD (degenerative joint disease)    LS spine  . Gallstones   . History of colonic polyps   . Hyperlipidemia   . Hypertension   . Osteoporosis    Past Surgical History:  Procedure Laterality Date  . ABDOMINAL HYSTERECTOMY    . CATARACT EXTRACTION    . COLONOSCOPY W/ POLYPECTOMY    . EYE SURGERY    . OOPHORECTOMY      reports that she has quit smoking. She has never used smokeless tobacco. She reports that she does not drink alcohol and does not use drugs. family history includes Cancer in her mother and another family member; Coronary artery disease in an other family member; Diabetes in her mother and another family member; Hypertension in an other family member; Parkinsonism in her father; Stroke in an other family member. No Known Allergies Current Outpatient Medications on File Prior to Visit  Medication Sig Dispense Refill  . amLODipine (NORVASC) 5 MG tablet Take 1 tablet (5 mg total) by mouth daily. 30 tablet 0  . aspirin 325 MG EC tablet Take 325 mg by mouth daily.    Marland Kitchen spironolactone (ALDACTONE) 50 MG tablet Take 1 tablet (50 mg total) by mouth 2 (two) times daily. 30 tablet 0  . albuterol (PROVENTIL HFA;VENTOLIN HFA) 108 (90 Base) MCG/ACT inhaler Inhale 2 puffs into the lungs every 6 (six)  hours as needed for wheezing or shortness of breath. (Patient not taking: Reported on 05/27/2020) 1 Inhaler 11  . rosuvastatin (CRESTOR) 10 MG tablet Take 1 tablet (10 mg total) by mouth daily. (Patient not taking: Reported on 05/27/2020) 30 tablet 11   Current Facility-Administered Medications on File Prior to Visit  Medication Dose Route Frequency Provider Last Rate Last Admin  . Bevacizumab (AVASTIN) SOLN 1.25 mg  1.25 mg Intravitreal  Rennis Chris, MD   1.25 mg at 11/22/17 1319  . Bevacizumab (AVASTIN) SOLN 1.25 mg  1.25 mg Intravitreal  Rennis Chris, MD   1.25 mg at 05/08/18 1019        ROS:  All others  reviewed and negative.  Objective        PE:  BP 128/70 (BP Location: Left Arm, Patient Position: Sitting, Cuff Size: Normal)   Pulse 63   Temp 98 F (36.7 C) (Oral)   Ht 5\' 4"  (1.626 m)   Wt 179 lb (81.2 kg)   SpO2 97%   BMI 30.73 kg/m                 Constitutional: Pt appears in NAD               HENT: Head: NCAT.                Right Ear: External ear normal.                 Left Ear: External ear normal.                Eyes: . Pupils are equal, round, and reactive to light. Conjunctivae and EOM are normal               Nose: without d/c or deformity               Neck: Neck supple. Gross normal ROM               Cardiovascular: Normal rate and regular rhythm.                 Pulmonary/Chest: Effort normal and breath sounds without rales or wheezing.                Abd:  Soft, NT, ND, + BS, no organomegaly               Neurological: Pt is alert. At baseline orientation, motor grossly intact               Skin: Skin is warm. No rashes, no other new lesions, LE edema - trace bilat               Psychiatric: Pt behavior is normal without agitation   Micro: none  Cardiac tracings I have personally interpreted today:  none  Pertinent Radiological findings (summarize): none   Lab Results  Component Value Date   WBC 5.3 05/27/2020   HGB 12.7 05/27/2020   HCT 37.2 05/27/2020   PLT 164.0 05/27/2020   GLUCOSE 80 05/27/2020   CHOL 219 (H) 05/27/2020   TRIG 95.0 05/27/2020   HDL 63.80 05/27/2020   LDLDIRECT 121.4 04/10/2013   LDLCALC 136 (H) 05/27/2020   ALT 18 05/27/2020   AST 27 05/27/2020   NA 137 05/27/2020   K 4.2 05/27/2020   CL 103 05/27/2020   CREATININE 1.13 05/27/2020   BUN 17 05/27/2020   CO2 25 05/27/2020   TSH 0.78 05/27/2020   HGBA1C  5.7 05/27/2020   Assessment/Plan:  Megan Baldwin is a 87 y.o. Black or African American [2] female with  has a past medical history of Allergic rhinitis, Anxiety, Aortic valve regurgitation (10/12/2010), CKD (chronic  kidney disease) stage 3, GFR 30-59 ml/min (HCC) (09/26/2017), COPD (chronic obstructive pulmonary disease) (HCC), Disc disease, degenerative, cervical, Diverticulosis of colon, DJD (degenerative joint disease), Gallstones, History of colonic polyps, Hyperlipidemia, Hypertension, and Osteoporosis.  Encounter for well adult exam with abnormal findings Age and sex appropriate education and counseling updated with regular exercise and diet Referrals for preventative services - none needed Immunizations addressed - declines flus shot and tdap Smoking counseling  - none needed Evidence for depression or other mood disorder - none significant Most recent labs reviewed. I have personally reviewed and have noted: 1) the patient's medical and social history 2) The patient's current medications and supplements 3) The patient's height, weight, and BMI have been recorded in the chart   COPD (chronic obstructive pulmonary disease) (HCC) Overall stable, cont current med tx - albuterol hfa prn   Weight loss Etiology unclear, for labs as ordered, suspect some geriatric decline  Hyperlipidemia Lab Results  Component Value Date   LDLCALC 136 (H) 05/27/2020   Stable, pt to continue current statin crestor 10 and lower chol diet, declines increased med   Hyperglycemia Lab Results  Component Value Date   HGBA1C 5.7 05/27/2020   Stable, pt to continue current medical treatment - diet   Essential hypertension BP Readings from Last 3 Encounters:  05/27/20 128/70  01/17/19 126/78  04/19/18 136/84   Stable, pt to continue medical treatment norvasc, labetolol   CKD (chronic kidney disease) stage 3, GFR 30-59 ml/min (HCC) Lab Results  Component Value Date   CREATININE 1.13 05/27/2020   Stable overall, cont to avoid nephrotoxins   B12 deficiency Lab Results  Component Value Date   VITAMINB12 53 (L) 05/27/2020   Low to start oral replacement - b12 1000 mcg qd   Followup: Return in about  6 months (around 11/27/2020).  Oliver Barre, MD 06/03/2020 7:48 PM [facility]

## 2020-05-28 ENCOUNTER — Encounter: Payer: Self-pay | Admitting: Internal Medicine

## 2020-05-28 ENCOUNTER — Other Ambulatory Visit: Payer: Self-pay | Admitting: Internal Medicine

## 2020-05-28 LAB — BASIC METABOLIC PANEL
BUN: 17 mg/dL (ref 6–23)
CO2: 25 mEq/L (ref 19–32)
Calcium: 9.6 mg/dL (ref 8.4–10.5)
Chloride: 103 mEq/L (ref 96–112)
Creatinine, Ser: 1.13 mg/dL (ref 0.40–1.20)
GFR: 44.5 mL/min — ABNORMAL LOW (ref >60.00)
Glucose, Bld: 80 mg/dL (ref 70–99)
Potassium: 4.2 mEq/L (ref 3.5–5.1)
Sodium: 137 mEq/L (ref 135–145)

## 2020-05-28 LAB — VITAMIN D 25 HYDROXY (VIT D DEFICIENCY, FRACTURES): VITD: 50.94 ng/mL (ref 30.00–100.00)

## 2020-05-28 LAB — CBC WITH DIFFERENTIAL/PLATELET
Basophils Absolute: 0.1 10*3/uL (ref 0.0–0.1)
Basophils Relative: 1 % (ref 0.0–3.0)
Eosinophils Absolute: 0.3 10*3/uL (ref 0.0–0.7)
Eosinophils Relative: 5.1 % — ABNORMAL HIGH (ref 0.0–5.0)
HCT: 37.2 % (ref 36.0–46.0)
Hemoglobin: 12.7 g/dL (ref 12.0–15.0)
Lymphocytes Relative: 38 % (ref 12.0–46.0)
Lymphs Abs: 2 10*3/uL (ref 0.7–4.0)
MCHC: 34.2 g/dL (ref 30.0–36.0)
MCV: 99.4 fl (ref 78.0–100.0)
Monocytes Absolute: 0.6 10*3/uL (ref 0.1–1.0)
Monocytes Relative: 10.4 % (ref 3.0–12.0)
Neutro Abs: 2.4 10*3/uL (ref 1.4–7.7)
Neutrophils Relative %: 45.5 % (ref 43.0–77.0)
Platelets: 164 10*3/uL (ref 150.0–400.0)
RBC: 3.74 Mil/uL — ABNORMAL LOW (ref 3.87–5.11)
RDW: 15.6 % — ABNORMAL HIGH (ref 11.5–15.5)
WBC: 5.3 10*3/uL (ref 4.0–10.5)

## 2020-05-28 LAB — URINALYSIS, ROUTINE W REFLEX MICROSCOPIC
Bilirubin Urine: NEGATIVE
Hgb urine dipstick: NEGATIVE
Ketones, ur: NEGATIVE
Nitrite: NEGATIVE
RBC / HPF: NONE SEEN (ref 0–?)
Specific Gravity, Urine: 1.01 (ref 1.000–1.030)
Total Protein, Urine: NEGATIVE
Urine Glucose: NEGATIVE
Urobilinogen, UA: 0.2 (ref 0.0–1.0)
pH: 6 (ref 5.0–8.0)

## 2020-05-28 LAB — LIPID PANEL
Cholesterol: 219 mg/dL — ABNORMAL HIGH (ref 0–200)
HDL: 63.8 mg/dL (ref >39.00)
LDL Cholesterol: 136 mg/dL — ABNORMAL HIGH (ref 0–99)
NonHDL: 154.99
Total CHOL/HDL Ratio: 3
Triglycerides: 95 mg/dL (ref 0.0–149.0)
VLDL: 19 mg/dL (ref 0.0–40.0)

## 2020-05-28 LAB — HEPATIC FUNCTION PANEL
ALT: 18 U/L (ref 0–35)
AST: 27 U/L (ref 0–37)
Albumin: 4.3 g/dL (ref 3.5–5.2)
Alkaline Phosphatase: 61 U/L (ref 39–117)
Bilirubin, Direct: 0.1 mg/dL (ref 0.0–0.3)
Total Bilirubin: 0.6 mg/dL (ref 0.2–1.2)
Total Protein: 8.1 g/dL (ref 6.0–8.3)

## 2020-05-28 LAB — HEMOGLOBIN A1C: Hgb A1c MFr Bld: 5.7 % (ref 4.6–6.5)

## 2020-05-28 LAB — TSH: TSH: 0.78 u[IU]/mL (ref 0.35–4.50)

## 2020-05-28 LAB — PHOSPHORUS: Phosphorus: 3.9 mg/dL (ref 2.3–4.6)

## 2020-05-28 LAB — VITAMIN B12: Vitamin B-12: 53 pg/mL — ABNORMAL LOW (ref 211–911)

## 2020-05-28 MED ORDER — VITAMIN B-12 1000 MCG PO TABS: 1000.0000 ug | ORAL_TABLET | Freq: Every day | ORAL | 3 refills | Status: AC

## 2020-05-29 LAB — PTH, INTACT AND CALCIUM
Calcium: 9.4 mg/dL (ref 8.6–10.4)
PTH: 31 pg/mL (ref 16–77)

## 2020-05-31 ENCOUNTER — Other Ambulatory Visit: Payer: Self-pay | Admitting: Internal Medicine

## 2020-05-31 NOTE — Telephone Encounter (Signed)
Please refill as per office routine med refill policy (all routine meds refilled for 3 mo or monthly per pt preference up to one year from last visit, then month to month grace period for 3 mo, then further med refills will have to be denied)  

## 2020-06-03 ENCOUNTER — Encounter: Payer: Self-pay | Admitting: Internal Medicine

## 2020-06-03 DIAGNOSIS — E538 Deficiency of other specified B group vitamins: Secondary | ICD-10-CM | POA: Insufficient documentation

## 2020-06-03 DIAGNOSIS — R634 Abnormal weight loss: Secondary | ICD-10-CM | POA: Insufficient documentation

## 2020-06-03 NOTE — Assessment & Plan Note (Signed)
Lab Results  Component Value Date   CREATININE 1.13 05/27/2020   Stable overall, cont to avoid nephrotoxins

## 2020-06-03 NOTE — Assessment & Plan Note (Signed)
Lab Results  Component Value Date   LDLCALC 136 (H) 05/27/2020   Stable, pt to continue current statin crestor 10 and lower chol diet, declines increased med

## 2020-06-03 NOTE — Assessment & Plan Note (Signed)
Lab Results  Component Value Date   HGBA1C 5.7 05/27/2020   Stable, pt to continue current medical treatment - diet

## 2020-06-03 NOTE — Assessment & Plan Note (Addendum)
Overall stable, cont current med tx- albuterol hfa prn 

## 2020-06-03 NOTE — Assessment & Plan Note (Signed)
Age and sex appropriate education and counseling updated with regular exercise and diet Referrals for preventative services - none needed Immunizations addressed - declines flus shot and tdap Smoking counseling  - none needed Evidence for depression or other mood disorder - none significant Most recent labs reviewed. I have personally reviewed and have noted: 1) the patient's medical and social history 2) The patient's current medications and supplements 3) The patient's height, weight, and BMI have been recorded in the chart

## 2020-06-03 NOTE — Assessment & Plan Note (Signed)
Lab Results  Component Value Date   VITAMINB12 53 (L) 05/27/2020   Low to start oral replacement - b12 1000 mcg qd

## 2020-06-03 NOTE — Assessment & Plan Note (Signed)
BP Readings from Last 3 Encounters:  05/27/20 128/70  01/17/19 126/78  04/19/18 136/84   Stable, pt to continue medical treatment norvasc, labetolol

## 2020-06-03 NOTE — Assessment & Plan Note (Signed)
Etiology unclear, for labs as ordered, suspect some geriatric decline

## 2020-06-15 ENCOUNTER — Other Ambulatory Visit: Payer: Self-pay | Admitting: Internal Medicine

## 2020-06-15 NOTE — Telephone Encounter (Signed)
Please refill as per office routine med refill policy (all routine meds refilled for 3 mo or monthly per pt preference up to one year from last visit, then month to month grace period for 3 mo, then further med refills will have to be denied)  

## 2020-07-03 ENCOUNTER — Telehealth: Payer: Self-pay | Admitting: Internal Medicine

## 2020-07-03 MED ORDER — SPIRONOLACTONE 50 MG PO TABS: 50.0000 mg | ORAL_TABLET | Freq: Two times a day (BID) | ORAL | 3 refills | Status: DC

## 2020-07-03 MED ORDER — LABETALOL HCL 200 MG PO TABS: 200.0000 mg | ORAL_TABLET | Freq: Two times a day (BID) | ORAL | 3 refills | Status: DC

## 2020-07-03 MED ORDER — AMLODIPINE BESYLATE 5 MG PO TABS: 5.0000 mg | ORAL_TABLET | Freq: Every day | ORAL | 3 refills | Status: DC

## 2020-07-03 NOTE — Telephone Encounter (Signed)
1.Medication Requested: amLODipine (NORVASC) 5 MG tablet  spironolactone (ALDACTONE) 50 MG tablet  labetalol (NORMODYNE) 200 MG tablet    2. Pharmacy (Name, Street, [facility]): [facility] - [place], [place] - [address]  3. On Med List: yes   4. Last Visit with PCP: 05-27-20  5. Next visit date with PCP: 11-27-20   Agent: Please be advised that RX refills may take up to 3 business days. We ask that you follow-up with your pharmacy.

## 2020-11-14 ENCOUNTER — Other Ambulatory Visit: Payer: Self-pay | Admitting: Internal Medicine

## 2020-11-14 NOTE — Telephone Encounter (Signed)
Please refill as per office routine med refill policy (all routine meds to be refilled for 3 mo or monthly (per pt preference) up to one year from last visit, then month to month grace period for 3 mo, then further med refills will have to be denied) ? ?

## 2020-11-26 ENCOUNTER — Other Ambulatory Visit: Payer: Self-pay | Admitting: Internal Medicine

## 2020-11-27 ENCOUNTER — Ambulatory Visit: Payer: Medicare HMO | Admitting: Internal Medicine

## 2021-01-21 ENCOUNTER — Other Ambulatory Visit: Payer: Self-pay | Admitting: Internal Medicine

## 2021-01-21 NOTE — Telephone Encounter (Signed)
Please refill as per office routine med refill policy (all routine meds to be refilled for 3 mo or monthly (per pt preference) up to one year from last visit, then month to month grace period for 3 mo, then further med refills will have to be denied) ? ?

## 2021-03-06 ENCOUNTER — Other Ambulatory Visit: Payer: Self-pay | Admitting: Internal Medicine

## 2021-03-06 NOTE — Telephone Encounter (Signed)
Please refill as per office routine med refill policy (all routine meds to be refilled for 3 mo or monthly (per pt preference) up to one year from last visit, then month to month grace period for 3 mo, then further med refills will have to be denied) ? ?

## 2021-04-22 ENCOUNTER — Ambulatory Visit (INDEPENDENT_AMBULATORY_CARE_PROVIDER_SITE_OTHER): Payer: Medicare PPO

## 2021-04-22 ENCOUNTER — Other Ambulatory Visit: Payer: Self-pay

## 2021-04-22 ENCOUNTER — Ambulatory Visit (INDEPENDENT_AMBULATORY_CARE_PROVIDER_SITE_OTHER): Payer: Medicare PPO | Admitting: Family Medicine

## 2021-04-22 ENCOUNTER — Ambulatory Visit: Payer: Self-pay

## 2021-04-22 VITALS — BP 128/78 | HR 66 | Ht 64.0 in | Wt 180.0 lb

## 2021-04-22 DIAGNOSIS — M25552 Pain in left hip: Secondary | ICD-10-CM

## 2021-04-22 DIAGNOSIS — M1612 Unilateral primary osteoarthritis, left hip: Secondary | ICD-10-CM | POA: Diagnosis not present

## 2021-04-22 NOTE — Patient Instructions (Addendum)
Thank you for coming in today.   You received a steroid injection in your left hip today. Seek immediate medical attention if the joint becomes red, extremely painful, or is oozing fluid.   Recheck back as needed

## 2021-04-22 NOTE — Progress Notes (Signed)
I, Megan Baldwin, LAT, ATC acting as a scribe for Megan Graham, MD.  Subjective:    CC: L hip pain  HPI: Pt is an 88 y/o female c/o L hip pain x 6 month to 1 year. Pt locates pain to anterior hip and into the L groin. Pt notes that her knees are starting to bother her because of changing her gait due her hip pain.   Low back pain: no Radiating: no LE numbness/tingling: no LE weakness: no- may feel weak after walking for longer periods Aggravates: walking, sitting for long period, transitioning to stand Treatments tried: Voltaren gel, Glucosamin, rubbing alcohol, hot salt water  Dx imaging: 02/26/15 Bilat hip/pelvis XR  03/31/12 L hip XR  Pertinent review of Systems: No fevers or chills  Relevant historical information: COPD.  Osteopenia.   Objective:    Vitals:   04/22/21 1312  BP: 128/78  Pulse: 66  SpO2: 95%   General: Well Developed, well nourished, and in no acute distress.   MSK: Left hip: Normal appearing Decreased hip motion. Antalgic gait  Lab and Radiology Results  Procedure: Real-time Ultrasound Guided Injection of left hip femoral acetabular joint Device: Philips Affiniti 50G Images permanently stored and available for review in PACS Verbal informed consent obtained.  Discussed risks and benefits of procedure. Warned about infection bleeding damage to structures skin hypopigmentation and fat atrophy among others. Patient expresses understanding and agreement Time-out conducted.   Noted no overlying erythema, induration, or other signs of local infection.   Skin prepped in a sterile fashion.   Local anesthesia: Topical Ethyl chloride.   With sterile technique and under real time ultrasound guidance: 40 mg of Kenalog and 2 mL of Marcaine injected into hip joint. Fluid seen entering the joint capsule.   Completed without difficulty   Pain immediately resolved suggesting accurate placement of the medication.   Advised to call if fevers/chills,  erythema, induration, drainage, or persistent bleeding.   Images permanently stored and available for review in the ultrasound unit.  Impression: Technically successful ultrasound guided injection.      X-ray images left hip obtained today personally and independently interpreted Severe arthritis left hip.  Moderate arthritis right hip.  No acute fractures. Await formal radiology review  EXAM: DG HIP (WITH OR WITHOUT PELVIS) 3-4V BILAT   COMPARISON:  03/31/2012   FINDINGS: Pelvic bony ring is intact. Symmetric appearance of the sacroiliac joints without gross abnormality. Medial joint space narrowing in the right hip without a right hip fracture or dislocation. There is mild superior joint space narrowing in the left hip joint. Left hip is located without acute fracture. Mild sclerosis in the femoral heads without definite AVN.   IMPRESSION: Osteoarthritis in both hip joints. Negative for fracture or dislocation.     Electronically Signed   By: Megan Baldwin M.D.   On: 02/26/2015 16:07 I, Megan Baldwin, personally (independently) visualized and performed the interpretation of the images attached in this note.   Impression and Recommendations:    Assessment and Plan: 88 y.o. female with left anterior hip pain thought to be due to her hip DJD.  This has been worsening significantly over the last 6 months.  She had hip arthritis in 2016 and a hip x-ray and certainly has worsening hip arthritis on x-ray today.  Intra-articular hip injection today provided immediate benefit indicating that her pain generator is her hip joint.  I am not optimistic about conservative management options helping very much.  Hopefully she will  have some long benefit from the injection today however if it does not provide lasting benefit I think a hip replacement is probably her best option.  She does have some pretty good functional capacity right now so may be a candidate for hip replacement.  She is not  ready to consider that right now but would be an option in the future.    PDMP not reviewed this encounter. Orders Placed This Encounter  Procedures   [place] LIMITED JOINT SPACE STRUCTURES LOW LEFT(NO LINKED CHARGES)    Standing Status:   Future    Number of Occurrences:   1    Standing Expiration Date:   10/20/2021    Order Specific Question:   Reason for Exam (SYMPTOM  OR DIAGNOSIS REQUIRED)    Answer:   left hip pain    Order Specific Question:   Preferred imaging location?    Answer:   [facility] HIP UNILAT W OR W/O PELVIS 2-3 VIEWS LEFT    Standing Status:   Future    Number of Occurrences:   1    Standing Expiration Date:   04/22/2022    Order Specific Question:   Reason for Exam (SYMPTOM  OR DIAGNOSIS REQUIRED)    Answer:   left hip pain    Order Specific Question:   Preferred imaging location?    Answer:   Megan Baldwin   No orders of the defined types were placed in this encounter.   Discussed warning signs or symptoms. Please see discharge instructions. Patient expresses understanding.   The above documentation has been reviewed and is accurate and complete Megan Baldwin, M.D.

## 2021-04-24 NOTE — Progress Notes (Signed)
Left hip x-ray shows medium right and severe left hip arthritis.

## 2021-05-07 ENCOUNTER — Telehealth: Payer: Self-pay | Admitting: Internal Medicine

## 2021-05-07 NOTE — Telephone Encounter (Signed)
Patient daughter calling in ? ?Says she is in town visiting patient & noticed patient is out of spironolactone (ALDACTONE) 50 MG tablet .. says it is being delivered via mail order but she does not know when ? ?Wants to know if provider can send in short supply fill until mail order comes in ? ?[id number] - [place], [place] - [address] Phone:  [phone]  ?Fax:  [phone]  ?  ? ? ?

## 2021-05-08 ENCOUNTER — Other Ambulatory Visit: Payer: Self-pay | Admitting: Internal Medicine

## 2021-05-08 MED ORDER — SPIRONOLACTONE 50 MG PO TABS: 50.0000 mg | ORAL_TABLET | Freq: Two times a day (BID) | ORAL | 0 refills | Status: DC

## 2021-05-08 NOTE — Telephone Encounter (Signed)
Please refill as per office routine med refill policy (all routine meds to be refilled for 3 mo or monthly (per pt preference) up to one year from last visit, then month to month grace period for 3 mo, then further med refills will have to be denied) ? ?

## 2021-05-08 NOTE — Telephone Encounter (Signed)
Prescription sent to pharmacy and patient notified.  

## 2021-06-13 ENCOUNTER — Other Ambulatory Visit: Payer: Self-pay | Admitting: Internal Medicine

## 2021-06-13 NOTE — Telephone Encounter (Signed)
Please refill as per office routine med refill policy (all routine meds to be refilled for 3 mo or monthly (per pt preference) up to one year from last visit, then month to month grace period for 3 mo, then further med refills will have to be denied) ? ?

## 2021-07-22 ENCOUNTER — Other Ambulatory Visit: Payer: Self-pay | Admitting: Internal Medicine

## 2021-07-22 NOTE — Telephone Encounter (Signed)
Please refill as per office routine med refill policy (all routine meds to be refilled for 3 mo or monthly (per pt preference) up to one year from last visit, then month to month grace period for 3 mo, then further med refills will have to be denied) ? ?

## 2021-08-05 ENCOUNTER — Telehealth: Payer: Self-pay | Admitting: Internal Medicine

## 2021-08-05 NOTE — Telephone Encounter (Signed)
Patient advised that she is due for her physical and we are not able to refill medications until she is seen. Patient declined to schedule at this time due to lack of transportation.

## 2021-08-05 NOTE — Telephone Encounter (Signed)
Patient needs the following refills sent to [facility]  Labetol 200 mg. Spironolactone  50 mg Vitamin B  Amlodipine 5 mg.

## 2021-08-07 ENCOUNTER — Telehealth: Payer: Self-pay | Admitting: Internal Medicine

## 2021-08-07 NOTE — Telephone Encounter (Signed)
1.Medication Requested: labetalol (NORMODYNE) 200 MG tablet amLODipine (NORVASC) 5 MG tablet spironolactone (ALDACTONE) 50 MG tablet  2. Pharmacy (Name, Street, [place]): [phone] [place], [place] - [address] Phone:  [phone]  Fax:  [phone]     3. On Med List: yes  4. Last Visit with PCP:  5. Next visit date with PCP:  Please send over to pharmacy ASAP.  Agent: Please be advised that RX refills may take up to 3 business days. We ask that you follow-up with your pharmacy.

## 2021-08-11 ENCOUNTER — Other Ambulatory Visit: Payer: Self-pay | Admitting: Internal Medicine

## 2021-08-11 ENCOUNTER — Encounter: Payer: Self-pay | Admitting: Internal Medicine

## 2021-08-11 ENCOUNTER — Ambulatory Visit (INDEPENDENT_AMBULATORY_CARE_PROVIDER_SITE_OTHER): Payer: Medicare PPO | Admitting: Internal Medicine

## 2021-08-11 VITALS — BP 132/80 | HR 77 | Temp 98.0°F | Ht 64.0 in | Wt 176.0 lb

## 2021-08-11 DIAGNOSIS — E538 Deficiency of other specified B group vitamins: Secondary | ICD-10-CM | POA: Diagnosis not present

## 2021-08-11 DIAGNOSIS — N1831 Chronic kidney disease, stage 3a: Secondary | ICD-10-CM

## 2021-08-11 DIAGNOSIS — E559 Vitamin D deficiency, unspecified: Secondary | ICD-10-CM

## 2021-08-11 DIAGNOSIS — F411 Generalized anxiety disorder: Secondary | ICD-10-CM

## 2021-08-11 DIAGNOSIS — I10 Essential (primary) hypertension: Secondary | ICD-10-CM | POA: Diagnosis not present

## 2021-08-11 DIAGNOSIS — R739 Hyperglycemia, unspecified: Secondary | ICD-10-CM

## 2021-08-11 DIAGNOSIS — J449 Chronic obstructive pulmonary disease, unspecified: Secondary | ICD-10-CM

## 2021-08-11 DIAGNOSIS — Z0001 Encounter for general adult medical examination with abnormal findings: Secondary | ICD-10-CM

## 2021-08-11 DIAGNOSIS — E785 Hyperlipidemia, unspecified: Secondary | ICD-10-CM

## 2021-08-11 LAB — LIPID PANEL
Cholesterol: 210 mg/dL — ABNORMAL HIGH (ref 0–200)
HDL: 75.6 mg/dL (ref >39.00)
LDL Cholesterol: 110 mg/dL — ABNORMAL HIGH (ref 0–99)
NonHDL: 133.93
Total CHOL/HDL Ratio: 3
Triglycerides: 120 mg/dL (ref 0.0–149.0)
VLDL: 24 mg/dL (ref 0.0–40.0)

## 2021-08-11 LAB — HEPATIC FUNCTION PANEL
ALT: 15 U/L (ref 0–35)
AST: 20 U/L (ref 0–37)
Albumin: 4.4 g/dL (ref 3.5–5.2)
Alkaline Phosphatase: 71 U/L (ref 39–117)
Bilirubin, Direct: 0.1 mg/dL (ref 0.0–0.3)
Total Bilirubin: 0.6 mg/dL (ref 0.2–1.2)
Total Protein: 7.9 g/dL (ref 6.0–8.3)

## 2021-08-11 LAB — BASIC METABOLIC PANEL
BUN: 18 mg/dL (ref 6–23)
CO2: 25 mEq/L (ref 19–32)
Calcium: 9.8 mg/dL (ref 8.4–10.5)
Chloride: 102 mEq/L (ref 96–112)
Creatinine, Ser: 1.28 mg/dL — ABNORMAL HIGH (ref 0.40–1.20)
GFR: 38 mL/min — ABNORMAL LOW (ref >60.00)
Glucose, Bld: 88 mg/dL (ref 70–99)
Potassium: 4 mEq/L (ref 3.5–5.1)
Sodium: 136 mEq/L (ref 135–145)

## 2021-08-11 LAB — CBC WITH DIFFERENTIAL/PLATELET
Basophils Absolute: 0 10*3/uL (ref 0.0–0.1)
Basophils Relative: 0.6 % (ref 0.0–3.0)
Eosinophils Absolute: 0.3 10*3/uL (ref 0.0–0.7)
Eosinophils Relative: 5.8 % — ABNORMAL HIGH (ref 0.0–5.0)
HCT: 39.1 % (ref 36.0–46.0)
Hemoglobin: 13.3 g/dL (ref 12.0–15.0)
Lymphocytes Relative: 36.6 % (ref 12.0–46.0)
Lymphs Abs: 1.9 10*3/uL (ref 0.7–4.0)
MCHC: 34 g/dL (ref 30.0–36.0)
MCV: 87.6 fl (ref 78.0–100.0)
Monocytes Absolute: 0.7 10*3/uL (ref 0.1–1.0)
Monocytes Relative: 14.5 % — ABNORMAL HIGH (ref 3.0–12.0)
Neutro Abs: 2.2 10*3/uL (ref 1.4–7.7)
Neutrophils Relative %: 42.5 % — ABNORMAL LOW (ref 43.0–77.0)
Platelets: 152 10*3/uL (ref 150.0–400.0)
RBC: 4.47 Mil/uL (ref 3.87–5.11)
RDW: 13.6 % (ref 11.5–15.5)
WBC: 5.2 10*3/uL (ref 4.0–10.5)

## 2021-08-11 LAB — VITAMIN B12: Vitamin B-12: 247 pg/mL (ref 211–911)

## 2021-08-11 LAB — URINALYSIS, ROUTINE W REFLEX MICROSCOPIC
Bilirubin Urine: NEGATIVE
Hgb urine dipstick: NEGATIVE
Ketones, ur: NEGATIVE
Nitrite: NEGATIVE
RBC / HPF: NONE SEEN (ref 0–?)
Specific Gravity, Urine: 1.02 (ref 1.000–1.030)
Total Protein, Urine: NEGATIVE
Urine Glucose: NEGATIVE
Urobilinogen, UA: 0.2 (ref 0.0–1.0)
pH: 6 (ref 5.0–8.0)

## 2021-08-11 LAB — HEMOGLOBIN A1C: Hgb A1c MFr Bld: 5.8 % (ref 4.6–6.5)

## 2021-08-11 LAB — TSH: TSH: 0.87 u[IU]/mL (ref 0.35–5.50)

## 2021-08-11 LAB — VITAMIN D 25 HYDROXY (VIT D DEFICIENCY, FRACTURES): VITD: 48.44 ng/mL (ref 30.00–100.00)

## 2021-08-11 MED ORDER — AMLODIPINE BESYLATE 5 MG PO TABS: 5.0000 mg | ORAL_TABLET | Freq: Every day | ORAL | 3 refills | Status: DC

## 2021-08-11 MED ORDER — DIAZEPAM 5 MG PO TABS: 5.0000 mg | ORAL_TABLET | Freq: Two times a day (BID) | ORAL | 1 refills | Status: DC | PRN

## 2021-08-11 MED ORDER — LABETALOL HCL 200 MG PO TABS: 200.0000 mg | ORAL_TABLET | Freq: Two times a day (BID) | ORAL | 3 refills | Status: DC

## 2021-08-11 MED ORDER — SPIRONOLACTONE 50 MG PO TABS: 50.0000 mg | ORAL_TABLET | Freq: Two times a day (BID) | ORAL | 3 refills | Status: DC

## 2021-08-11 NOTE — Patient Instructions (Addendum)
Please go for your yearly eye exam  Please continue all other medications as before, and refills have been done if requested.  Please have the pharmacy call with any other refills you may need.  Please continue your efforts at being more active, low cholesterol diet, and weight control.  You are otherwise up to date with prevention measures today.  Please keep your appointments with your specialists as you may have planned  Please go to the LAB at the blood drawing area for the tests to be done  You will be contacted by phone if any changes need to be made immediately.  Otherwise, you will receive a letter about your results with an explanation, but please check with MyChart first.  Please remember to sign up for MyChart if you have not done so, as this will be important to you in the future with finding out test results, communicating by private email, and scheduling acute appointments online when needed.  Please make an Appointment to return in 6 months, or sooner if needed

## 2021-08-11 NOTE — Progress Notes (Signed)
Patient ID: Megan Baldwin, female   DOB: 01-Sep-1933, 88 y.o.   MRN: [id number]         Chief Complaint:: wellness exam and hld, hyperglycemia, htn, ckd, b12 def, anxiety, copd       HPI:  Megan Baldwin is a 88 y.o. female here for wellness exam; plans to see optho herself; decliens covid booster, tdap, shingrix o/w up to date                        Also not taking crestor at this time, and not sure she wants to start.  Pt denies chest pain, increased sob or doe, wheezing, orthopnea, PND, increased LE swelling, palpitations, dizziness or syncope.   Pt denies polydipsia, polyuria, or new focal neuro s/s.    Pt denies fever, wt loss, night sweats, loss of appetite, or other constitutional symptoms  Denies worsening depressive symptoms, suicidal ideation, or panic; has ongoing anxiety, asks for valium refill.     Wt Readings from Last 3 Encounters:  08/11/21 176 lb (79.8 kg)  04/22/21 180 lb (81.6 kg)  05/27/20 179 lb (81.2 kg)   BP Readings from Last 3 Encounters:  08/11/21 132/80  04/22/21 128/78  05/27/20 128/70   Immunization History  Administered Date(s) Administered   Fluad Quad(high Dose 65+) 01/17/2019   Influenza Split 01/18/2011, 11/25/2011   Influenza Whole 12/07/2007, 12/10/2008   Influenza, High Dose Seasonal PF 01/03/2015, 12/04/2015, 12/14/2016, 12/27/2017   Influenza,inj,Quad PF,6+ Mos 11/29/2012, 12/19/2013   PFIZER(Purple Top)SARS-COV-2 Vaccination 04/29/2019, 05/23/2019, 01/31/2020   Pneumococcal Conjugate-13 12/21/2012   Pneumococcal Polysaccharide-23 11/30/2006   Td 03/01/1997, 08/09/2008   Health Maintenance Due  Topic Date Due   URINE MICROALBUMIN  Never done      Past Medical History:  Diagnosis Date   Allergic rhinitis    Anxiety    Aortic valve regurgitation 10/12/2010   CKD (chronic kidney disease) stage 3, GFR 30-59 ml/min (HCC) 09/26/2017   COPD (chronic obstructive pulmonary disease) (HCC)    Disc disease, degenerative, cervical    Diverticulosis  of colon    DJD (degenerative joint disease)    LS spine   Gallstones    History of colonic polyps    Hyperlipidemia    Hypertension    Osteoporosis    Past Surgical History:  Procedure Laterality Date   ABDOMINAL HYSTERECTOMY     CATARACT EXTRACTION     COLONOSCOPY W/ POLYPECTOMY     EYE SURGERY     OOPHORECTOMY      reports that she has quit smoking. She has never used smokeless tobacco. She reports that she does not drink alcohol and does not use drugs. family history includes Cancer in her mother and another family member; Coronary artery disease in an other family member; Diabetes in her mother and another family member; Hypertension in an other family member; Parkinsonism in her father; Stroke in an other family member. No Known Allergies Current Outpatient Medications on File Prior to Visit  Medication Sig Dispense Refill   aspirin 325 MG EC tablet Take 325 mg by mouth daily.     vitamin B-12 (CYANOCOBALAMIN) 1000 MCG tablet Take 1 tablet (1,000 mcg total) by mouth daily. 90 tablet 3   Current Facility-Administered Medications on File Prior to Visit  Medication Dose Route Frequency Provider Last Rate Last Admin   Bevacizumab (AVASTIN) SOLN 1.25 mg  1.25 mg Intravitreal  [name], MD   1.25 mg at 11/22/17 1319   Bevacizumab (  AVASTIN) SOLN 1.25 mg  1.25 mg Intravitreal  [name], MD   1.25 mg at 05/08/18 1019        ROS:  All others reviewed and negative.  Objective        PE:  BP 132/80 (BP Location: Right Arm, Patient Position: Sitting, Cuff Size: Large)   Pulse 77   Temp 98 F (36.7 C) (Oral)   Ht 5\' 4"  (1.626 m)   Wt 176 lb (79.8 kg)   SpO2 97%   BMI 30.21 kg/m                 Constitutional: Pt appears in NAD               HENT: Head: NCAT.                Right Ear: External ear normal.                 Left Ear: External ear normal.                Eyes: . Pupils are equal, round, and reactive to light. Conjunctivae and EOM are normal                Nose: without d/c or deformity               Neck: Neck supple. Gross normal ROM               Cardiovascular: Normal rate and regular rhythm.                 Pulmonary/Chest: Effort normal and breath sounds without rales or wheezing.                Abd:  Soft, NT, ND, + BS, no organomegaly               Neurological: Pt is alert. At baseline orientation, motor grossly intact               Skin: Skin is warm. No rashes, no other new lesions, LE edema - none               Psychiatric: Pt behavior is normal without agitation   Micro: none  Cardiac tracings I have personally interpreted today:  none  Pertinent Radiological findings (summarize): none   Lab Results  Component Value Date   WBC 5.2 08/11/2021   HGB 13.3 08/11/2021   HCT 39.1 08/11/2021   PLT 152.0 08/11/2021   GLUCOSE 88 08/11/2021   CHOL 210 (H) 08/11/2021   TRIG 120.0 08/11/2021   HDL 75.60 08/11/2021   LDLDIRECT 121.4 04/10/2013   LDLCALC 110 (H) 08/11/2021   ALT 15 08/11/2021   AST 20 08/11/2021   NA 136 08/11/2021   K 4.0 08/11/2021   CL 102 08/11/2021   CREATININE 1.28 (H) 08/11/2021   BUN 18 08/11/2021   CO2 25 08/11/2021   TSH 0.87 08/11/2021   HGBA1C 5.8 08/11/2021   Assessment/Plan:  Megan Baldwin is a 88 y.o. Black or African American [2] female with  has a past medical history of Allergic rhinitis, Anxiety, Aortic valve regurgitation (10/12/2010), CKD (chronic kidney disease) stage 3, GFR 30-59 ml/min (HCC) (09/26/2017), COPD (chronic obstructive pulmonary disease) (HCC), Disc disease, degenerative, cervical, Diverticulosis of colon, DJD (degenerative joint disease), Gallstones, History of colonic polyps, Hyperlipidemia, Hypertension, and Osteoporosis.  Encounter for well adult exam with abnormal findings Age and sex appropriate education and counseling updated with  regular exercise and diet Referrals for preventative services - plans to call for optho exam herself soon Immunizations addressed  -  decliens covid booster, tdap, shingrix Smoking counseling  - none needed Evidence for depression or other mood disorder - none significant Most recent labs reviewed. I have personally reviewed and have noted: 1) the patient's medical and social history 2) The patient's current medications and supplements 3) The patient's height, weight, and BMI have been recorded in the chart   COPD (chronic obstructive pulmonary disease) (HCC) Stable, pt to contineu current albut inhaler prn  Hyperlipidemia Lab Results  Component Value Date   LDLCALC 110 (H) 08/11/2021   Uncontrolled, goal ldl <100 pt to continue current low chol diet, declines statin for now   Hyperglycemia Lab Results  Component Value Date   HGBA1C 5.8 08/11/2021   Stable, pt to continue current medical treatment  - diet, wt control   Essential hypertension BP Readings from Last 3 Encounters:  08/11/21 132/80  04/22/21 128/78  05/27/20 128/70   Stable, pt to continue medical treatment norvasc, labetolol, aldactone    Current Outpatient Medications (Cardiovascular):    amLODipine (NORVASC) 5 MG tablet, Take 1 tablet (5 mg total) by mouth daily.   labetalol (NORMODYNE) 200 MG tablet, Take 1 tablet (200 mg total) by mouth 2 (two) times daily.   spironolactone (ALDACTONE) 50 MG tablet, Take 1 tablet (50 mg total) by mouth 2 (two) times daily.     Current Outpatient Medications (Analgesics):    aspirin 325 MG EC tablet, Take 325 mg by mouth daily.   Current Outpatient Medications (Hematological):    vitamin B-12 (CYANOCOBALAMIN) 1000 MCG tablet, Take 1 tablet (1,000 mcg total) by mouth daily.   Current Outpatient Medications (Other):    diazepam (VALIUM) 5 MG tablet, Take 1 tablet (5 mg total) by mouth every 12 (twelve) hours as needed for anxiety.  Current Facility-Administered Medications (Other):    Bevacizumab (AVASTIN) SOLN 1.25 mg   Bevacizumab (AVASTIN) SOLN 1.25 mg   CKD (chronic kidney disease)  stage 3, GFR 30-59 ml/min (HCC) Lab Results  Component Value Date   CREATININE 1.28 (H) 08/11/2021   Stable overall, cont to avoid nephrotoxins   B12 deficiency Lab Results  Component Value Date   VITAMINB12 247 08/11/2021   Low, to start oral replacement - b12 1000 mcg qd x 6 mo   Anxiety state Stable, continue valium prn  Followup: Return in about 6 months (around 02/10/2022).  Cathlean Cower, MD 08/13/2021 10:18 PM [facility]

## 2021-08-11 NOTE — Telephone Encounter (Signed)
Already done earlier today

## 2021-08-13 ENCOUNTER — Encounter: Payer: Self-pay | Admitting: Internal Medicine

## 2021-08-13 DIAGNOSIS — Z604 Social exclusion and rejection: Secondary | ICD-10-CM | POA: Diagnosis not present

## 2021-08-13 DIAGNOSIS — M1990 Unspecified osteoarthritis, unspecified site: Secondary | ICD-10-CM | POA: Diagnosis not present

## 2021-08-13 DIAGNOSIS — Z5982 Transportation insecurity: Secondary | ICD-10-CM | POA: Diagnosis not present

## 2021-08-13 DIAGNOSIS — E785 Hyperlipidemia, unspecified: Secondary | ICD-10-CM | POA: Diagnosis not present

## 2021-08-13 DIAGNOSIS — N189 Chronic kidney disease, unspecified: Secondary | ICD-10-CM | POA: Diagnosis not present

## 2021-08-13 DIAGNOSIS — E669 Obesity, unspecified: Secondary | ICD-10-CM | POA: Diagnosis not present

## 2021-08-13 DIAGNOSIS — R32 Unspecified urinary incontinence: Secondary | ICD-10-CM | POA: Diagnosis not present

## 2021-08-13 DIAGNOSIS — F419 Anxiety disorder, unspecified: Secondary | ICD-10-CM | POA: Diagnosis not present

## 2021-08-13 DIAGNOSIS — I129 Hypertensive chronic kidney disease with stage 1 through stage 4 chronic kidney disease, or unspecified chronic kidney disease: Secondary | ICD-10-CM | POA: Diagnosis not present

## 2021-08-13 NOTE — Assessment & Plan Note (Signed)
Lab Results  Component Value Date   CREATININE 1.28 (H) 08/11/2021   Stable overall, cont to avoid nephrotoxins

## 2021-08-13 NOTE — Assessment & Plan Note (Signed)
BP Readings from Last 3 Encounters:  08/11/21 132/80  04/22/21 128/78  05/27/20 128/70   Stable, pt to continue medical treatment norvasc, labetolol, aldactone    Current Outpatient Medications (Cardiovascular):  .  amLODipine (NORVASC) 5 MG tablet, Take 1 tablet (5 mg total) by mouth daily. Marland Kitchen  labetalol (NORMODYNE) 200 MG tablet, Take 1 tablet (200 mg total) by mouth 2 (two) times daily. Marland Kitchen  spironolactone (ALDACTONE) 50 MG tablet, Take 1 tablet (50 mg total) by mouth 2 (two) times daily.     Current Outpatient Medications (Analgesics):  .  aspirin 325 MG EC tablet, Take 325 mg by mouth daily.   Current Outpatient Medications (Hematological):  .  vitamin B-12 (CYANOCOBALAMIN) 1000 MCG tablet, Take 1 tablet (1,000 mcg total) by mouth daily.   Current Outpatient Medications (Other):  .  diazepam (VALIUM) 5 MG tablet, Take 1 tablet (5 mg total) by mouth every 12 (twelve) hours as needed for anxiety.  Current Facility-Administered Medications (Other):  .  Bevacizumab (AVASTIN) SOLN 1.25 mg .  Bevacizumab (AVASTIN) SOLN 1.25 mg

## 2021-08-13 NOTE — Assessment & Plan Note (Signed)
Stable, pt to contineu current albut inhaler prn

## 2021-08-13 NOTE — Assessment & Plan Note (Signed)
Age and sex appropriate education and counseling updated with regular exercise and diet Referrals for preventative services - plans to call for optho exam herself soon Immunizations addressed  - decliens covid booster, tdap, shingrix Smoking counseling  - none needed Evidence for depression or other mood disorder - none significant Most recent labs reviewed. I have personally reviewed and have noted: 1) the patient's medical and social history 2) The patient's current medications and supplements 3) The patient's height, weight, and BMI have been recorded in the chart

## 2021-08-13 NOTE — Assessment & Plan Note (Addendum)
Lab Results  Component Value Date   VITAMINB12 247 08/11/2021   Low, to start oral replacement - b12 1000 mcg qd x 6 mo

## 2021-08-13 NOTE — Assessment & Plan Note (Signed)
Lab Results  Component Value Date   HGBA1C 5.8 08/11/2021   Stable, pt to continue current medical treatment  - diet, wt control

## 2021-08-13 NOTE — Assessment & Plan Note (Signed)
Lab Results  Component Value Date   LDLCALC 110 (H) 08/11/2021   Uncontrolled, goal ldl <100 pt to continue current low chol diet, declines statin for now

## 2021-08-13 NOTE — Assessment & Plan Note (Signed)
Stable, continue valium prn

## 2021-08-24 ENCOUNTER — Telehealth: Payer: Self-pay | Admitting: Internal Medicine

## 2021-12-15 ENCOUNTER — Telehealth: Payer: Self-pay | Admitting: Internal Medicine

## 2021-12-15 NOTE — Telephone Encounter (Signed)
Left message for patient to call back and schedule Medicare Annual Wellness Visit (AWV).   Please offer to do virtually or by telephone.  Left office number and my jabber [phone].  Last AWV:09/11/2015  Please schedule at anytime with Nurse Health Advisor.

## 2022-01-11 ENCOUNTER — Other Ambulatory Visit: Payer: Self-pay

## 2022-01-11 ENCOUNTER — Other Ambulatory Visit: Payer: Self-pay | Admitting: Internal Medicine

## 2022-01-11 NOTE — Telephone Encounter (Signed)
Please refill as per office routine med refill policy (all routine meds to be refilled for 3 mo or monthly (per pt preference) up to one year from last visit, then month to month grace period for 3 mo, then further med refills will have to be denied) ? ?

## 2022-04-02 ENCOUNTER — Telehealth: Payer: Self-pay

## 2022-04-02 NOTE — Telephone Encounter (Signed)
Left message for patient to call back to schedule Medicare Annual Wellness Visit    Please schedule at anytime with [facility] Rembrandt if patient calls the office back.    30 Minutes appointment   Any questions, please call me at [phone]

## 2022-04-27 ENCOUNTER — Other Ambulatory Visit: Payer: Self-pay | Admitting: Internal Medicine

## 2022-04-27 ENCOUNTER — Ambulatory Visit (INDEPENDENT_AMBULATORY_CARE_PROVIDER_SITE_OTHER): Payer: Medicare PPO | Admitting: Internal Medicine

## 2022-04-27 ENCOUNTER — Encounter: Payer: Self-pay | Admitting: Internal Medicine

## 2022-04-27 ENCOUNTER — Ambulatory Visit (INDEPENDENT_AMBULATORY_CARE_PROVIDER_SITE_OTHER): Payer: Medicare PPO

## 2022-04-27 VITALS — BP 130/78 | HR 66 | Temp 98.4°F | Ht 64.0 in | Wt 170.0 lb

## 2022-04-27 DIAGNOSIS — E538 Deficiency of other specified B group vitamins: Secondary | ICD-10-CM | POA: Diagnosis not present

## 2022-04-27 DIAGNOSIS — N1831 Chronic kidney disease, stage 3a: Secondary | ICD-10-CM

## 2022-04-27 DIAGNOSIS — R0781 Pleurodynia: Secondary | ICD-10-CM

## 2022-04-27 DIAGNOSIS — R413 Other amnesia: Secondary | ICD-10-CM | POA: Diagnosis not present

## 2022-04-27 DIAGNOSIS — Z041 Encounter for examination and observation following transport accident: Secondary | ICD-10-CM | POA: Diagnosis not present

## 2022-04-27 DIAGNOSIS — E785 Hyperlipidemia, unspecified: Secondary | ICD-10-CM

## 2022-04-27 DIAGNOSIS — M25552 Pain in left hip: Secondary | ICD-10-CM

## 2022-04-27 DIAGNOSIS — I10 Essential (primary) hypertension: Secondary | ICD-10-CM

## 2022-04-27 DIAGNOSIS — R739 Hyperglycemia, unspecified: Secondary | ICD-10-CM

## 2022-04-27 DIAGNOSIS — J449 Chronic obstructive pulmonary disease, unspecified: Secondary | ICD-10-CM | POA: Diagnosis not present

## 2022-04-27 DIAGNOSIS — Z0001 Encounter for general adult medical examination with abnormal findings: Secondary | ICD-10-CM | POA: Diagnosis not present

## 2022-04-27 DIAGNOSIS — M25551 Pain in right hip: Secondary | ICD-10-CM

## 2022-04-27 DIAGNOSIS — R296 Repeated falls: Secondary | ICD-10-CM

## 2022-04-27 DIAGNOSIS — E559 Vitamin D deficiency, unspecified: Secondary | ICD-10-CM

## 2022-04-27 DIAGNOSIS — M160 Bilateral primary osteoarthritis of hip: Secondary | ICD-10-CM | POA: Diagnosis not present

## 2022-04-27 DIAGNOSIS — Z043 Encounter for examination and observation following other accident: Secondary | ICD-10-CM | POA: Diagnosis not present

## 2022-04-27 LAB — HEPATIC FUNCTION PANEL
ALT: 12 U/L (ref 0–35)
AST: 20 U/L (ref 0–37)
Albumin: 4.4 g/dL (ref 3.5–5.2)
Alkaline Phosphatase: 71 U/L (ref 39–117)
Bilirubin, Direct: 0.2 mg/dL (ref 0.0–0.3)
Total Bilirubin: 0.8 mg/dL (ref 0.2–1.2)
Total Protein: 8.4 g/dL — ABNORMAL HIGH (ref 6.0–8.3)

## 2022-04-27 LAB — MICROALBUMIN / CREATININE URINE RATIO
Creatinine,U: 253.5 mg/dL
Microalb Creat Ratio: 1.7 mg/g (ref 0.0–30.0)
Microalb, Ur: 4.4 mg/dL — ABNORMAL HIGH (ref 0.0–1.9)

## 2022-04-27 LAB — URINALYSIS, ROUTINE W REFLEX MICROSCOPIC
Bilirubin Urine: NEGATIVE
Hgb urine dipstick: NEGATIVE
Ketones, ur: NEGATIVE
Leukocytes,Ua: NEGATIVE
Nitrite: NEGATIVE
RBC / HPF: NONE SEEN (ref 0–?)
Specific Gravity, Urine: 1.025 (ref 1.000–1.030)
Urine Glucose: NEGATIVE
Urobilinogen, UA: 2 — AB (ref 0.0–1.0)
pH: 6 (ref 5.0–8.0)

## 2022-04-27 LAB — BASIC METABOLIC PANEL
BUN: 19 mg/dL (ref 6–23)
CO2: 22 mEq/L (ref 19–32)
Calcium: 10.1 mg/dL (ref 8.4–10.5)
Chloride: 102 mEq/L (ref 96–112)
Creatinine, Ser: 1.13 mg/dL (ref 0.40–1.20)
GFR: 43.91 mL/min — ABNORMAL LOW (ref >60.00)
Glucose, Bld: 94 mg/dL (ref 70–99)
Potassium: 3.8 mEq/L (ref 3.5–5.1)
Sodium: 135 mEq/L (ref 135–145)

## 2022-04-27 LAB — LIPID PANEL
Cholesterol: 206 mg/dL — ABNORMAL HIGH (ref 0–200)
HDL: 83.8 mg/dL (ref >39.00)
LDL Cholesterol: 100 mg/dL — ABNORMAL HIGH (ref 0–99)
NonHDL: 122.51
Total CHOL/HDL Ratio: 2
Triglycerides: 115 mg/dL (ref 0.0–149.0)
VLDL: 23 mg/dL (ref 0.0–40.0)

## 2022-04-27 LAB — CBC WITH DIFFERENTIAL/PLATELET
Basophils Absolute: 0 10*3/uL (ref 0.0–0.1)
Basophils Relative: 0.4 % (ref 0.0–3.0)
Eosinophils Absolute: 0.1 10*3/uL (ref 0.0–0.7)
Eosinophils Relative: 1.9 % (ref 0.0–5.0)
HCT: 39.9 % (ref 36.0–46.0)
Hemoglobin: 13.4 g/dL (ref 12.0–15.0)
Lymphocytes Relative: 17.3 % (ref 12.0–46.0)
Lymphs Abs: 1.3 10*3/uL (ref 0.7–4.0)
MCHC: 33.5 g/dL (ref 30.0–36.0)
MCV: 87.8 fl (ref 78.0–100.0)
Monocytes Absolute: 0.6 10*3/uL (ref 0.1–1.0)
Monocytes Relative: 8.4 % (ref 3.0–12.0)
Neutro Abs: 5.5 10*3/uL (ref 1.4–7.7)
Neutrophils Relative %: 72 % (ref 43.0–77.0)
Platelets: 119 10*3/uL — ABNORMAL LOW (ref 150.0–400.0)
RBC: 4.54 Mil/uL (ref 3.87–5.11)
RDW: 14 % (ref 11.5–15.5)
WBC: 7.7 10*3/uL (ref 4.0–10.5)

## 2022-04-27 LAB — VITAMIN B12: Vitamin B-12: 132 pg/mL — ABNORMAL LOW (ref 211–911)

## 2022-04-27 LAB — HEMOGLOBIN A1C: Hgb A1c MFr Bld: 5.7 % (ref 4.6–6.5)

## 2022-04-27 LAB — TSH: TSH: 0.78 u[IU]/mL (ref 0.35–5.50)

## 2022-04-27 LAB — VITAMIN D 25 HYDROXY (VIT D DEFICIENCY, FRACTURES): VITD: 81.05 ng/mL (ref 30.00–100.00)

## 2022-04-27 NOTE — Patient Instructions (Addendum)
Please remember to see your Eye Exam soon  Please have your Shingrix (shingles) shots done at your local pharmacy  Ok to continue the Tylenol as needed  Please continue all other medications as before, and refills have been done if requested.  Please have the pharmacy call with any other refills you may need.  Please continue your efforts at being more active, low cholesterol diet, and weight control.  Please keep your appointments with your specialists as you may have planned  You will be contacted regarding the referral for: Home Health with RN, and physical Therapy  You are given the script for the hospital bed for home  Please go to the XRAY Department in the first floor for the x-ray testing  Please go to the LAB at the blood drawing area for the tests to be done  You will be contacted by phone if any changes need to be made immediately.  Otherwise, you will receive a letter about your results with an explanation, but please check with MyChart first.  Please remember to sign up for MyChart if you have not done so, as this will be important to you in the future with finding out test results, communicating by private email, and scheduling acute appointments online when needed.  Please make an Appointment to return in 3 months, or sooner if needed

## 2022-04-27 NOTE — Progress Notes (Signed)
Patient ID: Megan Baldwin, female   DOB: [birth year], 89 y.o.   MRN: [id number]         Chief Complaint:: wellness exam and 17mofollow up (Order needed for hot tub and physical therapy,adjustable bed for back pain)  , recurrent falls, left rib and hip pain, memory changes, ckd3a       HPI:  Megan Linseyis a 89y.o. female here for wellness exam with concerned supportive daughter; for tdap and shingrix at pharmacy, declines covid booster, flu shot, plans to call for eye exam soon; o/w up to date                        Also Pt denies chest pain, increased sob or doe, wheezing, orthopnea, PND, increased LE swelling, palpitations, dizziness or syncope, except for sore tenderness to lower left anterolateral, mild pleuritic and left hip soreness as well to touch and walk after sliding/falling out of bed twice in last 2 days.  No bruising.  Daughter asking for hosp bed, HH with PT as pt also has worsening memory loss changes in past several months, still able to take her meds though  [place]  in [place].  Pt denies polydipsia, polyuria, or new focal neuro s/s.    Pt denies fever, night sweats, loss of appetite, or other constitutional symptoms  though has lost wt recently with less appetitie   Wt Readings from Last 3 Encounters:  04/27/22 170 lb (77.1 kg)  08/11/21 176 lb (79.8 kg)  04/22/21 180 lb (81.6 kg)   BP Readings from Last 3 Encounters:  04/27/22 130/78  08/11/21 132/80  04/22/21 128/78   Immunization History  Administered Date(s) Administered   Fluad Quad(high Dose 65+) 01/17/2019   Influenza Split 01/18/2011, 11/25/2011   Influenza Whole 12/07/2007, 12/10/2008   Influenza, High Dose Seasonal PF 01/03/2015, 12/04/2015, 12/14/2016, 12/27/2017   Influenza,inj,Quad PF,6+ Mos 11/29/2012, 12/19/2013   PFIZER(Purple Top)SARS-COV-2 Vaccination 04/29/2019, 05/23/2019, 01/31/2020   Pneumococcal Conjugate-13 12/21/2012   Pneumococcal Polysaccharide-23 11/30/2006   Td  03/01/1997, 08/09/2008   Health Maintenance Due  Topic Date Due   Medicare Annual Wellness (AWV)  09/10/2016   DTaP/Tdap/Td (3 - Tdap) 08/10/2018      Past Medical History:  Diagnosis Date   Allergic rhinitis    Anxiety    Aortic valve regurgitation 10/12/2010   CKD (chronic kidney disease) stage 3, GFR 30-59 ml/min (HCC) 09/26/2017   COPD (chronic obstructive pulmonary disease) (HCC)    Disc disease, degenerative, cervical    Diverticulosis of colon    DJD (degenerative joint disease)    LS spine   Gallstones    History of colonic polyps    Hyperlipidemia    Hypertension    Osteoporosis    Past Surgical History:  Procedure Laterality Date   ABDOMINAL HYSTERECTOMY     CATARACT EXTRACTION     COLONOSCOPY W/ POLYPECTOMY     EYE SURGERY     OOPHORECTOMY      reports that she has quit smoking. She has never used smokeless tobacco. She reports that she does not drink alcohol and does not use drugs. family history includes Cancer in her mother and another family member; Coronary artery disease in an other family member; Diabetes in her mother and another family member; Hypertension in an other family member; Parkinsonism in her father; Stroke in an other family member. No Known Allergies Current Outpatient Medications on File Prior to Visit  Medication Sig Dispense Refill   amLODipine (NORVASC) 5 MG tablet Take 1 tablet (5 mg total) by mouth daily. 90 tablet 3   aspirin 325 MG EC tablet Take 325 mg by mouth daily.     diazepam (VALIUM) 5 MG tablet Take 1 tablet (5 mg total) by mouth every 12 (twelve) hours as needed for anxiety. 60 tablet 1   labetalol (NORMODYNE) 200 MG tablet TAKE 1 TABLET TWICE DAILY 180 tablet 10   spironolactone (ALDACTONE) 50 MG tablet TAKE 1 TABLET TWICE DAILY 180 tablet 10   vitamin B-12 (CYANOCOBALAMIN) 1000 MCG tablet Take 1 tablet (1,000 mcg total) by mouth daily. 90 tablet 3   Current Facility-Administered Medications on File Prior to Visit   Medication Dose Route Frequency Provider Last Rate Last Admin   Bevacizumab (AVASTIN) SOLN 1.25 mg  1.25 mg Intravitreal  [name], MD   1.25 mg at 11/22/17 1319   Bevacizumab (AVASTIN) SOLN 1.25 mg  1.25 mg Intravitreal  [name], MD   1.25 mg at 05/08/18 1019        ROS:  All others reviewed and negative.  Objective        PE:  BP 130/78 (BP Location: Right Arm, Patient Position: Sitting, Cuff Size: Large)   Pulse 66   Temp 98.4 F (36.9 C) (Oral)   Ht '5\' 4"'$  (1.626 m)   Wt 170 lb (77.1 kg)   SpO2 90%   BMI 29.18 kg/m                 Constitutional: Pt appears in NAD               HENT: Head: NCAT.                Right Ear: External ear normal.                 Left Ear: External ear normal.                Eyes: . Pupils are equal, round, and reactive to light. Conjunctivae and EOM are normal               Nose: without d/c or deformity               Neck: Neck supple. Gross normal ROM               Cardiovascular: Normal rate and regular rhythm.                 Pulmonary/Chest: Effort normal and breath sounds without rales or wheezing.                Abd:  Soft, NT, ND, + BS, no organomegaly               Neurological: Pt is alert. At baseline orientation, motor grossly intact               Skin: Skin is warm. No rashes, no other new lesions, LE edema - none               Psychiatric: Pt behavior is normal without agitation   Micro: none  Cardiac tracings I have personally interpreted today:  none  Pertinent Radiological findings (summarize): none   Lab Results  Component Value Date   WBC 7.7 04/27/2022   HGB 13.4 04/27/2022   HCT 39.9 04/27/2022   PLT 119.0 (L) 04/27/2022   GLUCOSE 94 04/27/2022   CHOL 206 (  H) 04/27/2022   TRIG 115.0 04/27/2022   HDL 83.80 04/27/2022   LDLDIRECT 121.4 04/10/2013   LDLCALC 100 (H) 04/27/2022   ALT 12 04/27/2022   AST 20 04/27/2022   NA 135 04/27/2022   K 3.8 04/27/2022   CL 102 04/27/2022   CREATININE 1.13  04/27/2022   BUN 19 04/27/2022   CO2 22 04/27/2022   TSH 0.78 04/27/2022   HGBA1C 5.7 04/27/2022   MICROALBUR 4.4 (H) 04/27/2022   Assessment/Plan:  Megan Baldwin is a 89 y.o. Black or African American [2] female with  has a past medical history of Allergic rhinitis, Anxiety, Aortic valve regurgitation (10/12/2010), CKD (chronic kidney disease) stage 3, GFR 30-59 ml/min (HCC) (09/26/2017), COPD (chronic obstructive pulmonary disease) (HCC), Disc disease, degenerative, cervical, Diverticulosis of colon, DJD (degenerative joint disease), Gallstones, History of colonic polyps, Hyperlipidemia, Hypertension, and Osteoporosis.  Encounter for well adult exam with abnormal findings Age and sex appropriate education and counseling updated with regular exercise and diet Referrals for preventative services - pt to call for eye exam soon Immunizations addressed - for tdap and shingrix at pharmacy, declines covid booster and flu shot Smoking counseling  - none needed Evidence for depression or other mood disorder - none significant Most recent labs reviewed. I have personally reviewed and have noted: 1) the patient's medical and social history 2) The patient's current medications and supplements 3) The patient's height, weight, and BMI have been recorded in the chart   B12 deficiency Lab Results  Component Value Date   VITAMINB12 132 (L) 04/27/2022   Low, to start oral replacement - b12 1000 mcg qd   CKD (chronic kidney disease) stage 3, GFR 30-59 ml/min (HCC) Lab Results  Component Value Date   CREATININE 1.13 04/27/2022   Stable overall, cont to avoid nephrotoxins  Essential hypertension BP Readings from Last 3 Encounters:  04/27/22 130/78  08/11/21 132/80  04/22/21 128/78   Stable, pt to continue medical treatment norvasc 5 mg qd, labetolol 200 bid, aldactone 50 bid   Hyperglycemia Lab Results  Component Value Date   HGBA1C 5.7 04/27/2022   Stable, pt to continue current  medical treatment  - diet, wt control  Hyperlipidemia Lab Results  Component Value Date   LDLCALC 100 (H) 04/27/2022   Uncontrolled, goal ldl < 70 pt declines statin for now, for lower chol diet   Memory changes With worsening recently, may need assisted living soon, declines neurology referral for now or MRI  Recurrent falls For [facility] with RN and PT, and hosp bed  Bilateral hip pain Exam benign, but with recent falls with need pelvic hip films  Rib pain on left side Also for cxr, rib films but apepars to be related to msk strain during falls  COPD (chronic obstructive pulmonary disease) (HCC) Stable overall, cont inhaler prn  Followup: Return in about 3 months (around 07/26/2022).  Cathlean Cower, MD 05/01/2022 7:28 AM [facility]

## 2022-04-29 ENCOUNTER — Other Ambulatory Visit: Payer: Self-pay | Admitting: Internal Medicine

## 2022-04-29 ENCOUNTER — Encounter: Payer: Self-pay | Admitting: Internal Medicine

## 2022-04-29 DIAGNOSIS — M25551 Pain in right hip: Secondary | ICD-10-CM

## 2022-04-29 DIAGNOSIS — M160 Bilateral primary osteoarthritis of hip: Secondary | ICD-10-CM

## 2022-05-01 ENCOUNTER — Encounter: Payer: Self-pay | Admitting: Internal Medicine

## 2022-05-01 NOTE — Assessment & Plan Note (Addendum)
For [facility] with RN and PT, and hosp bed

## 2022-05-01 NOTE — Assessment & Plan Note (Signed)
BP Readings from Last 3 Encounters:  04/27/22 130/78  08/11/21 132/80  04/22/21 128/78   Stable, pt to continue medical treatment norvasc 5 mg qd, labetolol 200 bid, aldactone 50 bid

## 2022-05-01 NOTE — Assessment & Plan Note (Signed)
Lab Results  Component Value Date   LDLCALC 100 (H) 04/27/2022   Uncontrolled, goal ldl < 70 pt declines statin for now, for lower chol diet

## 2022-05-01 NOTE — Assessment & Plan Note (Signed)
Lab Results  Component Value Date   CREATININE 1.13 04/27/2022   Stable overall, cont to avoid nephrotoxins

## 2022-05-01 NOTE — Assessment & Plan Note (Signed)
Lab Results  Component Value Date   VITAMINB12 132 (L) 04/27/2022   Low, to start oral replacement - b12 1000 mcg qd

## 2022-05-01 NOTE — Assessment & Plan Note (Signed)
Age and sex appropriate education and counseling updated with regular exercise and diet Referrals for preventative services - pt to call for eye exam soon Immunizations addressed - for tdap and shingrix at pharmacy, declines covid booster and flu shot Smoking counseling  - none needed Evidence for depression or other mood disorder - none significant Most recent labs reviewed. I have personally reviewed and have noted: 1) the patient's medical and social history 2) The patient's current medications and supplements 3) The patient's height, weight, and BMI have been recorded in the chart

## 2022-05-01 NOTE — Assessment & Plan Note (Signed)
Stable overall, cont inhaler prn

## 2022-05-01 NOTE — Assessment & Plan Note (Signed)
With worsening recently, may need assisted living soon, declines neurology referral for now or MRI

## 2022-05-01 NOTE — Assessment & Plan Note (Signed)
Lab Results  Component Value Date   HGBA1C 5.7 04/27/2022   Stable, pt to continue current medical treatment  - diet, wt control

## 2022-05-01 NOTE — Assessment & Plan Note (Signed)
Exam benign, but with recent falls with need pelvic hip films

## 2022-05-01 NOTE — Assessment & Plan Note (Signed)
Also for cxr, rib films but apepars to be related to msk strain during falls

## 2022-05-13 ENCOUNTER — Encounter: Payer: Self-pay | Admitting: Internal Medicine

## 2022-05-13 ENCOUNTER — Telehealth: Payer: Self-pay | Admitting: Internal Medicine

## 2022-05-13 DIAGNOSIS — R296 Repeated falls: Secondary | ICD-10-CM

## 2022-05-13 DIAGNOSIS — M25552 Pain in left hip: Secondary | ICD-10-CM

## 2022-05-13 NOTE — Telephone Encounter (Signed)
Ok we can try to arrange this, thanks

## 2022-05-13 NOTE — Telephone Encounter (Signed)
Pt's daughter York Grice calls back after speaking with PT. She stated that a facility had reached out to her but had informed her that they would not be able to offer physical therapy services in home for her, but they could see her at their facility.  Evette states this would not work for PT as they are homebound and that they were under the assumption that the physical therapy would be at their home.   CB: [phone]

## 2022-05-13 NOTE — Telephone Encounter (Signed)
See below

## 2022-05-13 NOTE — Telephone Encounter (Signed)
Unable to contact patients daughter after two attempts, automated system states call cannot be completed at this time

## 2022-05-13 NOTE — Telephone Encounter (Signed)
Patient was seen 04/27/22 and was told she would receive calls to set up physical therapy and home health with an RN. She has not received any calls and would like to know when she could expect a call.  Best callback is [phone].

## 2022-05-13 NOTE — Telephone Encounter (Signed)
Please advise, I will schedule an appt if needed

## 2022-05-16 DIAGNOSIS — N1832 Chronic kidney disease, stage 3b: Secondary | ICD-10-CM | POA: Diagnosis not present

## 2022-05-16 DIAGNOSIS — J449 Chronic obstructive pulmonary disease, unspecified: Secondary | ICD-10-CM | POA: Diagnosis not present

## 2022-05-16 DIAGNOSIS — R413 Other amnesia: Secondary | ICD-10-CM | POA: Diagnosis not present

## 2022-05-16 DIAGNOSIS — M160 Bilateral primary osteoarthritis of hip: Secondary | ICD-10-CM | POA: Diagnosis not present

## 2022-05-16 DIAGNOSIS — M47817 Spondylosis without myelopathy or radiculopathy, lumbosacral region: Secondary | ICD-10-CM | POA: Diagnosis not present

## 2022-05-16 DIAGNOSIS — I351 Nonrheumatic aortic (valve) insufficiency: Secondary | ICD-10-CM | POA: Diagnosis not present

## 2022-05-16 DIAGNOSIS — R0781 Pleurodynia: Secondary | ICD-10-CM | POA: Diagnosis not present

## 2022-05-16 DIAGNOSIS — I129 Hypertensive chronic kidney disease with stage 1 through stage 4 chronic kidney disease, or unspecified chronic kidney disease: Secondary | ICD-10-CM | POA: Diagnosis not present

## 2022-05-16 DIAGNOSIS — M5030 Other cervical disc degeneration, unspecified cervical region: Secondary | ICD-10-CM | POA: Diagnosis not present

## 2022-05-18 ENCOUNTER — Telehealth: Payer: Self-pay | Admitting: Internal Medicine

## 2022-05-18 DIAGNOSIS — N1832 Chronic kidney disease, stage 3b: Secondary | ICD-10-CM | POA: Diagnosis not present

## 2022-05-18 DIAGNOSIS — J449 Chronic obstructive pulmonary disease, unspecified: Secondary | ICD-10-CM | POA: Diagnosis not present

## 2022-05-18 DIAGNOSIS — I351 Nonrheumatic aortic (valve) insufficiency: Secondary | ICD-10-CM | POA: Diagnosis not present

## 2022-05-18 DIAGNOSIS — I129 Hypertensive chronic kidney disease with stage 1 through stage 4 chronic kidney disease, or unspecified chronic kidney disease: Secondary | ICD-10-CM | POA: Diagnosis not present

## 2022-05-18 DIAGNOSIS — M5030 Other cervical disc degeneration, unspecified cervical region: Secondary | ICD-10-CM | POA: Diagnosis not present

## 2022-05-18 DIAGNOSIS — M47817 Spondylosis without myelopathy or radiculopathy, lumbosacral region: Secondary | ICD-10-CM | POA: Diagnosis not present

## 2022-05-18 DIAGNOSIS — R413 Other amnesia: Secondary | ICD-10-CM | POA: Diagnosis not present

## 2022-05-18 DIAGNOSIS — R0781 Pleurodynia: Secondary | ICD-10-CM | POA: Diagnosis not present

## 2022-05-18 DIAGNOSIS — M160 Bilateral primary osteoarthritis of hip: Secondary | ICD-10-CM | POA: Diagnosis not present

## 2022-05-18 NOTE — Telephone Encounter (Signed)
[name] attempted to return the call, but Rodman Pickle was rooming.   [name] states his VM is secure and we can leave him a detailed message about the orders.   Please call [name] back at: [phone]

## 2022-05-18 NOTE — Telephone Encounter (Signed)
Left message for a call back from [place] [facility]

## 2022-05-18 NOTE — Telephone Encounter (Signed)
Jim from [facility] called with concerns about the pt's Xray results from 2.27.24.   Pleas call [name] to discuss: [phone]

## 2022-05-19 ENCOUNTER — Other Ambulatory Visit: Payer: Self-pay | Admitting: Internal Medicine

## 2022-05-19 DIAGNOSIS — J449 Chronic obstructive pulmonary disease, unspecified: Secondary | ICD-10-CM

## 2022-05-19 DIAGNOSIS — R296 Repeated falls: Secondary | ICD-10-CM

## 2022-05-19 NOTE — Telephone Encounter (Signed)
Megan Baldwin has called again. Saw the pt late last night (3.19.24) and has several concerns.   Pt had L lower extremity, 8/10 px, and a +1 edema on the L foot, with no clear reason or source.   Pt's med list does not match [facility] med list. Pt is not taking Asprin, but taking VICS night sleep aid. Megan Baldwin is concerned the pt is having memory issues that she is trying to hide.   BP was elevated, 160/86.   PT orders for better mobility and to reduce fall risk:  2X for 2 weeks 1X for 4 weeks   Megan Baldwin is requesting a Education officer, museum for community resources & occupational therapy bathing & dressing   Megan Baldwin is also requesting we fax the pt's most recent X-rays Fax: [phone]   Please call Megan Baldwin to confirm orders: [phone]

## 2022-05-19 NOTE — Telephone Encounter (Signed)
Called Jim back no answer LMOM w/MD response on order. Faxed images to Fax: [phone].Marland KitchenJohny Chess

## 2022-05-19 NOTE — Telephone Encounter (Signed)
Pt needs [place] for verbals as recommended  Will try to add soc services and OT orders  Ok to fax most recent imaging results

## 2022-05-21 DIAGNOSIS — R0781 Pleurodynia: Secondary | ICD-10-CM | POA: Diagnosis not present

## 2022-05-21 DIAGNOSIS — I351 Nonrheumatic aortic (valve) insufficiency: Secondary | ICD-10-CM | POA: Diagnosis not present

## 2022-05-21 DIAGNOSIS — J449 Chronic obstructive pulmonary disease, unspecified: Secondary | ICD-10-CM | POA: Diagnosis not present

## 2022-05-21 DIAGNOSIS — M160 Bilateral primary osteoarthritis of hip: Secondary | ICD-10-CM | POA: Diagnosis not present

## 2022-05-21 DIAGNOSIS — M47817 Spondylosis without myelopathy or radiculopathy, lumbosacral region: Secondary | ICD-10-CM | POA: Diagnosis not present

## 2022-05-21 DIAGNOSIS — M5030 Other cervical disc degeneration, unspecified cervical region: Secondary | ICD-10-CM | POA: Diagnosis not present

## 2022-05-21 DIAGNOSIS — N1832 Chronic kidney disease, stage 3b: Secondary | ICD-10-CM | POA: Diagnosis not present

## 2022-05-21 DIAGNOSIS — I129 Hypertensive chronic kidney disease with stage 1 through stage 4 chronic kidney disease, or unspecified chronic kidney disease: Secondary | ICD-10-CM | POA: Diagnosis not present

## 2022-05-21 DIAGNOSIS — R413 Other amnesia: Secondary | ICD-10-CM | POA: Diagnosis not present

## 2022-05-24 DIAGNOSIS — M5030 Other cervical disc degeneration, unspecified cervical region: Secondary | ICD-10-CM | POA: Diagnosis not present

## 2022-05-24 DIAGNOSIS — M160 Bilateral primary osteoarthritis of hip: Secondary | ICD-10-CM | POA: Diagnosis not present

## 2022-05-24 DIAGNOSIS — R413 Other amnesia: Secondary | ICD-10-CM | POA: Diagnosis not present

## 2022-05-24 DIAGNOSIS — I129 Hypertensive chronic kidney disease with stage 1 through stage 4 chronic kidney disease, or unspecified chronic kidney disease: Secondary | ICD-10-CM | POA: Diagnosis not present

## 2022-05-24 DIAGNOSIS — M47817 Spondylosis without myelopathy or radiculopathy, lumbosacral region: Secondary | ICD-10-CM | POA: Diagnosis not present

## 2022-05-24 DIAGNOSIS — N1832 Chronic kidney disease, stage 3b: Secondary | ICD-10-CM | POA: Diagnosis not present

## 2022-05-24 DIAGNOSIS — R0781 Pleurodynia: Secondary | ICD-10-CM | POA: Diagnosis not present

## 2022-05-24 DIAGNOSIS — I351 Nonrheumatic aortic (valve) insufficiency: Secondary | ICD-10-CM | POA: Diagnosis not present

## 2022-05-24 DIAGNOSIS — J449 Chronic obstructive pulmonary disease, unspecified: Secondary | ICD-10-CM | POA: Diagnosis not present

## 2022-05-25 DIAGNOSIS — R0781 Pleurodynia: Secondary | ICD-10-CM | POA: Diagnosis not present

## 2022-05-25 DIAGNOSIS — N1832 Chronic kidney disease, stage 3b: Secondary | ICD-10-CM | POA: Diagnosis not present

## 2022-05-25 DIAGNOSIS — R413 Other amnesia: Secondary | ICD-10-CM | POA: Diagnosis not present

## 2022-05-25 DIAGNOSIS — I129 Hypertensive chronic kidney disease with stage 1 through stage 4 chronic kidney disease, or unspecified chronic kidney disease: Secondary | ICD-10-CM | POA: Diagnosis not present

## 2022-05-25 DIAGNOSIS — M160 Bilateral primary osteoarthritis of hip: Secondary | ICD-10-CM | POA: Diagnosis not present

## 2022-05-25 DIAGNOSIS — M47817 Spondylosis without myelopathy or radiculopathy, lumbosacral region: Secondary | ICD-10-CM | POA: Diagnosis not present

## 2022-05-25 DIAGNOSIS — I351 Nonrheumatic aortic (valve) insufficiency: Secondary | ICD-10-CM | POA: Diagnosis not present

## 2022-05-25 DIAGNOSIS — J449 Chronic obstructive pulmonary disease, unspecified: Secondary | ICD-10-CM | POA: Diagnosis not present

## 2022-05-25 DIAGNOSIS — M5030 Other cervical disc degeneration, unspecified cervical region: Secondary | ICD-10-CM | POA: Diagnosis not present

## 2022-05-28 DIAGNOSIS — R413 Other amnesia: Secondary | ICD-10-CM | POA: Diagnosis not present

## 2022-05-28 DIAGNOSIS — M5030 Other cervical disc degeneration, unspecified cervical region: Secondary | ICD-10-CM | POA: Diagnosis not present

## 2022-05-28 DIAGNOSIS — I129 Hypertensive chronic kidney disease with stage 1 through stage 4 chronic kidney disease, or unspecified chronic kidney disease: Secondary | ICD-10-CM | POA: Diagnosis not present

## 2022-05-28 DIAGNOSIS — I351 Nonrheumatic aortic (valve) insufficiency: Secondary | ICD-10-CM | POA: Diagnosis not present

## 2022-05-28 DIAGNOSIS — J449 Chronic obstructive pulmonary disease, unspecified: Secondary | ICD-10-CM | POA: Diagnosis not present

## 2022-05-28 DIAGNOSIS — M47817 Spondylosis without myelopathy or radiculopathy, lumbosacral region: Secondary | ICD-10-CM | POA: Diagnosis not present

## 2022-05-28 DIAGNOSIS — R0781 Pleurodynia: Secondary | ICD-10-CM | POA: Diagnosis not present

## 2022-05-28 DIAGNOSIS — N1832 Chronic kidney disease, stage 3b: Secondary | ICD-10-CM | POA: Diagnosis not present

## 2022-05-28 DIAGNOSIS — M160 Bilateral primary osteoarthritis of hip: Secondary | ICD-10-CM | POA: Diagnosis not present

## 2022-05-30 ENCOUNTER — Other Ambulatory Visit: Payer: Self-pay | Admitting: Internal Medicine

## 2022-06-02 ENCOUNTER — Telehealth: Payer: Self-pay | Admitting: Internal Medicine

## 2022-06-02 DIAGNOSIS — R413 Other amnesia: Secondary | ICD-10-CM | POA: Diagnosis not present

## 2022-06-02 DIAGNOSIS — R0781 Pleurodynia: Secondary | ICD-10-CM | POA: Diagnosis not present

## 2022-06-02 DIAGNOSIS — M47817 Spondylosis without myelopathy or radiculopathy, lumbosacral region: Secondary | ICD-10-CM | POA: Diagnosis not present

## 2022-06-02 DIAGNOSIS — N1832 Chronic kidney disease, stage 3b: Secondary | ICD-10-CM | POA: Diagnosis not present

## 2022-06-02 DIAGNOSIS — I129 Hypertensive chronic kidney disease with stage 1 through stage 4 chronic kidney disease, or unspecified chronic kidney disease: Secondary | ICD-10-CM | POA: Diagnosis not present

## 2022-06-02 DIAGNOSIS — I351 Nonrheumatic aortic (valve) insufficiency: Secondary | ICD-10-CM | POA: Diagnosis not present

## 2022-06-02 DIAGNOSIS — J449 Chronic obstructive pulmonary disease, unspecified: Secondary | ICD-10-CM | POA: Diagnosis not present

## 2022-06-02 DIAGNOSIS — M5030 Other cervical disc degeneration, unspecified cervical region: Secondary | ICD-10-CM | POA: Diagnosis not present

## 2022-06-02 DIAGNOSIS — M160 Bilateral primary osteoarthritis of hip: Secondary | ICD-10-CM | POA: Diagnosis not present

## 2022-06-02 NOTE — Telephone Encounter (Signed)
FYI, please advise

## 2022-06-02 NOTE — Telephone Encounter (Signed)
[facility] with [facility] called to inform provider that patient has declined any remaining skilled nursing visits, so they will be discharging patient from skilled nursing home health. Call back number for LeLe is [phone]

## 2022-06-04 DIAGNOSIS — M160 Bilateral primary osteoarthritis of hip: Secondary | ICD-10-CM | POA: Diagnosis not present

## 2022-06-04 DIAGNOSIS — I351 Nonrheumatic aortic (valve) insufficiency: Secondary | ICD-10-CM | POA: Diagnosis not present

## 2022-06-04 DIAGNOSIS — R413 Other amnesia: Secondary | ICD-10-CM | POA: Diagnosis not present

## 2022-06-04 DIAGNOSIS — M5030 Other cervical disc degeneration, unspecified cervical region: Secondary | ICD-10-CM | POA: Diagnosis not present

## 2022-06-04 DIAGNOSIS — N1832 Chronic kidney disease, stage 3b: Secondary | ICD-10-CM | POA: Diagnosis not present

## 2022-06-04 DIAGNOSIS — J449 Chronic obstructive pulmonary disease, unspecified: Secondary | ICD-10-CM | POA: Diagnosis not present

## 2022-06-04 DIAGNOSIS — I129 Hypertensive chronic kidney disease with stage 1 through stage 4 chronic kidney disease, or unspecified chronic kidney disease: Secondary | ICD-10-CM | POA: Diagnosis not present

## 2022-06-04 DIAGNOSIS — R0781 Pleurodynia: Secondary | ICD-10-CM | POA: Diagnosis not present

## 2022-06-04 DIAGNOSIS — M47817 Spondylosis without myelopathy or radiculopathy, lumbosacral region: Secondary | ICD-10-CM | POA: Diagnosis not present

## 2022-06-07 DIAGNOSIS — N1832 Chronic kidney disease, stage 3b: Secondary | ICD-10-CM | POA: Diagnosis not present

## 2022-06-07 DIAGNOSIS — J449 Chronic obstructive pulmonary disease, unspecified: Secondary | ICD-10-CM | POA: Diagnosis not present

## 2022-06-07 DIAGNOSIS — I351 Nonrheumatic aortic (valve) insufficiency: Secondary | ICD-10-CM | POA: Diagnosis not present

## 2022-06-07 DIAGNOSIS — R0781 Pleurodynia: Secondary | ICD-10-CM | POA: Diagnosis not present

## 2022-06-07 DIAGNOSIS — M47817 Spondylosis without myelopathy or radiculopathy, lumbosacral region: Secondary | ICD-10-CM | POA: Diagnosis not present

## 2022-06-07 DIAGNOSIS — I129 Hypertensive chronic kidney disease with stage 1 through stage 4 chronic kidney disease, or unspecified chronic kidney disease: Secondary | ICD-10-CM | POA: Diagnosis not present

## 2022-06-07 DIAGNOSIS — R413 Other amnesia: Secondary | ICD-10-CM | POA: Diagnosis not present

## 2022-06-07 DIAGNOSIS — M5030 Other cervical disc degeneration, unspecified cervical region: Secondary | ICD-10-CM | POA: Diagnosis not present

## 2022-06-07 DIAGNOSIS — M160 Bilateral primary osteoarthritis of hip: Secondary | ICD-10-CM | POA: Diagnosis not present

## 2022-06-10 DIAGNOSIS — R413 Other amnesia: Secondary | ICD-10-CM | POA: Diagnosis not present

## 2022-06-10 DIAGNOSIS — N1832 Chronic kidney disease, stage 3b: Secondary | ICD-10-CM | POA: Diagnosis not present

## 2022-06-10 DIAGNOSIS — M5030 Other cervical disc degeneration, unspecified cervical region: Secondary | ICD-10-CM | POA: Diagnosis not present

## 2022-06-10 DIAGNOSIS — I129 Hypertensive chronic kidney disease with stage 1 through stage 4 chronic kidney disease, or unspecified chronic kidney disease: Secondary | ICD-10-CM | POA: Diagnosis not present

## 2022-06-10 DIAGNOSIS — J449 Chronic obstructive pulmonary disease, unspecified: Secondary | ICD-10-CM | POA: Diagnosis not present

## 2022-06-10 DIAGNOSIS — M160 Bilateral primary osteoarthritis of hip: Secondary | ICD-10-CM | POA: Diagnosis not present

## 2022-06-10 DIAGNOSIS — R0781 Pleurodynia: Secondary | ICD-10-CM | POA: Diagnosis not present

## 2022-06-10 DIAGNOSIS — M47817 Spondylosis without myelopathy or radiculopathy, lumbosacral region: Secondary | ICD-10-CM | POA: Diagnosis not present

## 2022-06-10 DIAGNOSIS — I351 Nonrheumatic aortic (valve) insufficiency: Secondary | ICD-10-CM | POA: Diagnosis not present

## 2022-06-17 ENCOUNTER — Telehealth: Payer: Self-pay | Admitting: Internal Medicine

## 2022-06-17 DIAGNOSIS — I351 Nonrheumatic aortic (valve) insufficiency: Secondary | ICD-10-CM | POA: Diagnosis not present

## 2022-06-17 DIAGNOSIS — M5030 Other cervical disc degeneration, unspecified cervical region: Secondary | ICD-10-CM | POA: Diagnosis not present

## 2022-06-17 DIAGNOSIS — R0781 Pleurodynia: Secondary | ICD-10-CM | POA: Diagnosis not present

## 2022-06-17 DIAGNOSIS — J449 Chronic obstructive pulmonary disease, unspecified: Secondary | ICD-10-CM | POA: Diagnosis not present

## 2022-06-17 DIAGNOSIS — N1832 Chronic kidney disease, stage 3b: Secondary | ICD-10-CM | POA: Diagnosis not present

## 2022-06-17 DIAGNOSIS — I129 Hypertensive chronic kidney disease with stage 1 through stage 4 chronic kidney disease, or unspecified chronic kidney disease: Secondary | ICD-10-CM | POA: Diagnosis not present

## 2022-06-17 DIAGNOSIS — R413 Other amnesia: Secondary | ICD-10-CM | POA: Diagnosis not present

## 2022-06-17 DIAGNOSIS — M160 Bilateral primary osteoarthritis of hip: Secondary | ICD-10-CM | POA: Diagnosis not present

## 2022-06-17 DIAGNOSIS — M47817 Spondylosis without myelopathy or radiculopathy, lumbosacral region: Secondary | ICD-10-CM | POA: Diagnosis not present

## 2022-06-17 NOTE — Telephone Encounter (Signed)
Megan Baldwin with Megan Baldwin calls today in regards to PT's care. Megan Baldwin states that they are finished up early with PT as she has met her max potential.   They do note that PT has not been following recommendations and that she has not been able to make changes.  They do acknowledge concern for her not having any family within the state.  CB for Megan Baldwin if needed: [phone]

## 2022-06-17 NOTE — Telephone Encounter (Signed)
Please advise 

## 2022-06-17 NOTE — Telephone Encounter (Signed)
Ok this is noted, no further orders at this time, thanks

## 2023-01-11 ENCOUNTER — Ambulatory Visit: Payer: Medicare PPO | Admitting: Internal Medicine

## 2023-01-26 ENCOUNTER — Other Ambulatory Visit: Payer: Self-pay | Admitting: Internal Medicine

## 2023-01-26 NOTE — Telephone Encounter (Signed)
Done erx 

## 2023-03-23 ENCOUNTER — Ambulatory Visit: Payer: Medicare PPO | Admitting: Internal Medicine

## 2023-05-13 ENCOUNTER — Ambulatory Visit: Payer: Medicare (Managed Care) | Admitting: Internal Medicine

## 2023-05-13 ENCOUNTER — Encounter: Payer: Self-pay | Admitting: Internal Medicine

## 2023-05-13 VITALS — BP 144/72 | HR 68 | Temp 98.9°F | Ht 64.0 in | Wt 147.0 lb

## 2023-05-13 DIAGNOSIS — E538 Deficiency of other specified B group vitamins: Secondary | ICD-10-CM

## 2023-05-13 DIAGNOSIS — R634 Abnormal weight loss: Secondary | ICD-10-CM

## 2023-05-13 DIAGNOSIS — R739 Hyperglycemia, unspecified: Secondary | ICD-10-CM | POA: Diagnosis not present

## 2023-05-13 DIAGNOSIS — E559 Vitamin D deficiency, unspecified: Secondary | ICD-10-CM

## 2023-05-13 DIAGNOSIS — R269 Unspecified abnormalities of gait and mobility: Secondary | ICD-10-CM | POA: Diagnosis not present

## 2023-05-13 DIAGNOSIS — J449 Chronic obstructive pulmonary disease, unspecified: Secondary | ICD-10-CM

## 2023-05-13 DIAGNOSIS — M160 Bilateral primary osteoarthritis of hip: Secondary | ICD-10-CM

## 2023-05-13 DIAGNOSIS — W19XXXA Unspecified fall, initial encounter: Secondary | ICD-10-CM

## 2023-05-13 DIAGNOSIS — Z0001 Encounter for general adult medical examination with abnormal findings: Secondary | ICD-10-CM

## 2023-05-13 DIAGNOSIS — E785 Hyperlipidemia, unspecified: Secondary | ICD-10-CM | POA: Diagnosis not present

## 2023-05-13 DIAGNOSIS — I10 Essential (primary) hypertension: Secondary | ICD-10-CM | POA: Diagnosis not present

## 2023-05-13 DIAGNOSIS — N1831 Chronic kidney disease, stage 3a: Secondary | ICD-10-CM | POA: Diagnosis not present

## 2023-05-13 DIAGNOSIS — R413 Other amnesia: Secondary | ICD-10-CM | POA: Diagnosis not present

## 2023-05-13 DIAGNOSIS — R609 Edema, unspecified: Secondary | ICD-10-CM

## 2023-05-13 LAB — HEPATIC FUNCTION PANEL
ALT: 14 U/L (ref 0–35)
AST: 23 U/L (ref 0–37)
Albumin: 4.5 g/dL (ref 3.5–5.2)
Alkaline Phosphatase: 67 U/L (ref 39–117)
Bilirubin, Direct: 0.1 mg/dL (ref 0.0–0.3)
Total Bilirubin: 0.5 mg/dL (ref 0.2–1.2)
Total Protein: 8.1 g/dL (ref 6.0–8.3)

## 2023-05-13 LAB — URINALYSIS, ROUTINE W REFLEX MICROSCOPIC
Bilirubin Urine: NEGATIVE
Hgb urine dipstick: NEGATIVE
Ketones, ur: NEGATIVE
Nitrite: NEGATIVE
Specific Gravity, Urine: 1.02 (ref 1.000–1.030)
Urine Glucose: NEGATIVE
Urobilinogen, UA: 1 (ref 0.0–1.0)
pH: 6 (ref 5.0–8.0)

## 2023-05-13 LAB — BASIC METABOLIC PANEL
BUN: 12 mg/dL (ref 6–23)
CO2: 23 meq/L (ref 19–32)
Calcium: 9.7 mg/dL (ref 8.4–10.5)
Chloride: 104 meq/L (ref 96–112)
Creatinine, Ser: 0.95 mg/dL (ref 0.40–1.20)
GFR: 53.68 mL/min — ABNORMAL LOW (ref >60.00)
Glucose, Bld: 94 mg/dL (ref 70–99)
Potassium: 3.7 meq/L (ref 3.5–5.1)
Sodium: 136 meq/L (ref 135–145)

## 2023-05-13 LAB — LIPID PANEL
Cholesterol: 198 mg/dL (ref 0–200)
HDL: 73.2 mg/dL (ref >39.00)
LDL Cholesterol: 106 mg/dL — ABNORMAL HIGH (ref 0–99)
NonHDL: 124.45
Total CHOL/HDL Ratio: 3
Triglycerides: 90 mg/dL (ref 0.0–149.0)
VLDL: 18 mg/dL (ref 0.0–40.0)

## 2023-05-13 LAB — VITAMIN D 25 HYDROXY (VIT D DEFICIENCY, FRACTURES): VITD: 50.48 ng/mL (ref 30.00–100.00)

## 2023-05-13 LAB — CBC WITH DIFFERENTIAL/PLATELET
Basophils Absolute: 0 10*3/uL (ref 0.0–0.1)
Basophils Relative: 0.5 % (ref 0.0–3.0)
Eosinophils Absolute: 0.1 10*3/uL (ref 0.0–0.7)
Eosinophils Relative: 2 % (ref 0.0–5.0)
HCT: 34.9 % — ABNORMAL LOW (ref 36.0–46.0)
Hemoglobin: 11.7 g/dL — ABNORMAL LOW (ref 12.0–15.0)
Lymphocytes Relative: 36.8 % (ref 12.0–46.0)
Lymphs Abs: 1.6 10*3/uL (ref 0.7–4.0)
MCHC: 33.6 g/dL (ref 30.0–36.0)
MCV: 91.6 fl (ref 78.0–100.0)
Monocytes Absolute: 0.5 10*3/uL (ref 0.1–1.0)
Monocytes Relative: 12.5 % — ABNORMAL HIGH (ref 3.0–12.0)
Neutro Abs: 2.1 10*3/uL (ref 1.4–7.7)
Neutrophils Relative %: 48.2 % (ref 43.0–77.0)
Platelets: 123 10*3/uL — ABNORMAL LOW (ref 150.0–400.0)
RBC: 3.8 Mil/uL — ABNORMAL LOW (ref 3.87–5.11)
RDW: 17.4 % — ABNORMAL HIGH (ref 11.5–15.5)
WBC: 4.3 10*3/uL (ref 4.0–10.5)

## 2023-05-13 LAB — HEMOGLOBIN A1C: Hgb A1c MFr Bld: 6 % (ref 4.6–6.5)

## 2023-05-13 LAB — VITAMIN B12: Vitamin B-12: 73 pg/mL — ABNORMAL LOW (ref 211–911)

## 2023-05-13 LAB — TSH: TSH: 0.8 u[IU]/mL (ref 0.35–5.50)

## 2023-05-13 MED ORDER — AMLODIPINE BESYLATE 5 MG PO TABS
5.0000 mg | ORAL_TABLET | Freq: Every day | ORAL | 3 refills | Status: DC
Start: 2023-05-13 — End: 2023-10-25

## 2023-05-13 MED ORDER — LABETALOL HCL 200 MG PO TABS: 200.0000 mg | ORAL_TABLET | Freq: Two times a day (BID) | ORAL | 3 refills | Status: DC

## 2023-05-13 MED ORDER — SPIRONOLACTONE 50 MG PO TABS: 50.0000 mg | ORAL_TABLET | Freq: Two times a day (BID) | ORAL | 3 refills | Status: DC

## 2023-05-13 MED ORDER — TRAMADOL HCL 50 MG PO TABS: 50.0000 mg | ORAL_TABLET | Freq: Four times a day (QID) | ORAL | 2 refills | Status: DC | PRN

## 2023-05-13 NOTE — Progress Notes (Signed)
 Patient ID: Megan Baldwin, female   DOB: [birth year], 89 y.o.   MRN: [id number]         Chief Complaint:: wellness exam and Edema (Getting bad has been going on for over a week , and says her left leg is giving her pain and been having craving for sugar , wants to know if she has the right cane )  , bilat hip arthritis, gait disorder recent fall x 1, htn, memory changes       HPI:  Megan Baldwin is a 89 y.o. female here for wellness exam with daughter who lives in [place] (no other close family) and pt lives alone; Pt herself seems to have some short term memory difficulty to me today as her history of events seems to vary slightly at times; pt admits to non compliance with meds at times, though she thinks she mostly takes them, and tends to avoid the fluid pills it seems.  Has lost significant wt with low appetite.  Has worsening left > right hip arthritis pain with now pain on the left extending at times anteriorly to the knee.  Admits to fall x 1 last wk and lying on the floor for 3 hrs before eventually able to pull herself up.  She is in our office wheelchair today, not sure if her cane at home is most appropriate for her needs.  Has generalized weakness and wt loss to 147 lbs - appears to have lost over 20 lbs in the past yr.  Pt denies chest pain, increased sob or doe, wheezing, orthopnea, PND, palpitations, dizziness or syncope, but leg edema left > right much worse.  Marland Kitchen                        Also pt declines all immunizations, and will make an eye appt soon., o/w up to date   Wt Readings from Last 3 Encounters:  05/13/23 147 lb (66.7 kg)  04/27/22 170 lb (77.1 kg)  08/11/21 176 lb (79.8 kg)   BP Readings from Last 3 Encounters:  05/13/23 (!) 144/72  04/27/22 130/78  08/11/21 132/80   Immunization History  Administered Date(s) Administered   Fluad Quad(high Dose 65+) 01/17/2019   Influenza Split 01/18/2011, 11/25/2011   Influenza Whole 12/07/2007, 12/10/2008   Influenza, High Dose  Seasonal PF 01/03/2015, 12/04/2015, 12/14/2016, 12/27/2017   Influenza,inj,Quad PF,6+ Mos 11/29/2012, 12/19/2013   PFIZER(Purple Top)SARS-COV-2 Vaccination 04/29/2019, 05/23/2019, 01/31/2020   Pneumococcal Conjugate-13 12/21/2012   Pneumococcal Polysaccharide-23 11/30/2006   Td 03/01/1997, 08/09/2008   Health Maintenance Due  Topic Date Due   Zoster Vaccines- Shingrix (1 of 2) Never done   Medicare Annual Wellness (AWV)  09/10/2016   DTaP/Tdap/Td (3 - Tdap) 08/10/2018   OPHTHALMOLOGY EXAM  09/23/2020   INFLUENZA VACCINE  09/30/2022      Past Medical History:  Diagnosis Date   Allergic rhinitis    Anxiety    Aortic valve regurgitation 10/12/2010   CKD (chronic kidney disease) stage 3, GFR 30-59 ml/min (HCC) 09/26/2017   COPD (chronic obstructive pulmonary disease) (HCC)    Disc disease, degenerative, cervical    Diverticulosis of colon    DJD (degenerative joint disease)    LS spine   Gallstones    History of colonic polyps    Hyperlipidemia    Hypertension    Osteoporosis    Past Surgical History:  Procedure Laterality Date   ABDOMINAL HYSTERECTOMY     CATARACT EXTRACTION  COLONOSCOPY W/ POLYPECTOMY     EYE SURGERY     OOPHORECTOMY      reports that she has quit smoking. She has never used smokeless tobacco. She reports that she does not drink alcohol and does not use drugs. family history includes Cancer in her mother and another family member; Coronary artery disease in an other family member; Diabetes in her mother and another family member; Hypertension in an other family member; Parkinsonism in her father; Stroke in an other family member. No Known Allergies Current Outpatient Medications on File Prior to Visit  Medication Sig Dispense Refill   aspirin 325 MG EC tablet Take 325 mg by mouth daily.     diazepam (VALIUM) 5 MG tablet TAKE 1 TABLET (5 MG TOTAL) BY MOUTH EVERY 12 (TWELVE) HOURS AS NEEDED FOR ANXIETY. 60 tablet 2   vitamin B-12 (CYANOCOBALAMIN) 1000  MCG tablet Take 1 tablet (1,000 mcg total) by mouth daily. 90 tablet 3   No current facility-administered medications on file prior to visit.        ROS:  All others reviewed and negative.  Objective        PE:  BP (!) 144/72 (BP Location: Right Arm, Patient Position: Sitting, Cuff Size: Normal)   Pulse 68   Temp 98.9 F (37.2 C) (Oral)   Ht 5\' 4"  (1.626 m)   Wt 147 lb (66.7 kg)   SpO2 98%   BMI 25.23 kg/m                 Constitutional: Pt appears in NAD in office wheelchair               HENT: Head: NCAT.                Right Ear: External ear normal.                 Left Ear: External ear normal.                Eyes: . Pupils are equal, round, and reactive to light. Conjunctivae and EOM are normal               Nose: without d/c or deformity               Neck: Neck supple. Gross normal ROM               Cardiovascular: Normal rate and regular rhythm.                 Pulmonary/Chest: Effort normal and breath sounds without rales or wheezing.                Abd:  Soft, NT, ND, + BS, no organomegaly               Neurological: Pt is alert. At baseline orientation, motor grossly intact               Skin: Skin is warm. No rashes, no other new lesions, LE edema - 2+ whole left leg but nontender throughout, trace to 1+ RLE to knees               Psychiatric: Pt behavior is normal without agitation   Micro: none  Cardiac tracings I have personally interpreted today:  none  Pertinent Radiological findings (summarize): none   Lab Results  Component Value Date   WBC 4.3 05/13/2023   HGB 11.7 (L) 05/13/2023   HCT 34.9 (L) 05/13/2023  PLT 123.0 (L) 05/13/2023   GLUCOSE 94 05/13/2023   CHOL 198 05/13/2023   TRIG 90.0 05/13/2023   HDL 73.20 05/13/2023   LDLDIRECT 121.4 04/10/2013   LDLCALC 106 (H) 05/13/2023   ALT 14 05/13/2023   AST 23 05/13/2023   NA 136 05/13/2023   K 3.7 05/13/2023   CL 104 05/13/2023   CREATININE 0.95 05/13/2023   BUN 12 05/13/2023   CO2 23  05/13/2023   TSH 0.80 05/13/2023   HGBA1C 6.0 05/13/2023   MICROALBUR 4.4 (H) 04/27/2022   Assessment/Plan:  Megan Baldwin is a 89 y.o. Black or African American [2] female with  has a past medical history of Allergic rhinitis, Anxiety, Aortic valve regurgitation (10/12/2010), CKD (chronic kidney disease) stage 3, GFR 30-59 ml/min (HCC) (09/26/2017), COPD (chronic obstructive pulmonary disease) (HCC), Disc disease, degenerative, cervical, Diverticulosis of colon, DJD (degenerative joint disease), Gallstones, History of colonic polyps, Hyperlipidemia, Hypertension, and Osteoporosis.  Encounter for well adult exam with abnormal findings Age and sex appropriate education and counseling updated with regular exercise and diet Referrals for preventative services - pt to call for eye exam Immunizations addressed - declines all Smoking counseling  - none needed Evidence for depression or other mood disorder - none significant Most recent labs reviewed. I have personally reviewed and have noted: 1) the patient's medical and social history 2) The patient's current medications and supplements 3) The patient's height, weight, and BMI have been recorded in the chart\  COPD (chronic obstructive pulmonary disease) (HCC) Stable, declines need for inhaler prn  B12 deficiency Lab Results  Component Value Date   VITAMINB12 73 (L) 05/13/2023   Low, to start oral replacement - b12 1000 mcg qd   CKD (chronic kidney disease) stage 3, GFR 30-59 ml/min (HCC) Lab Results  Component Value Date   CREATININE 0.95 05/13/2023   Stable overall, cont to avoid nephrotoxins   Essential hypertension BP Readings from Last 3 Encounters:  05/13/23 (!) 144/72  04/27/22 130/78  08/11/21 132/80   Uncontrolled, suspect med non compliance, pt to continue medical treatment norvasc 5 every day, labetolol 200 bid, aldactone 50 bid   Hyperglycemia Lab Results  Component Value Date   HGBA1C 6.0 05/13/2023    Stable, pt to continue current medical treatment  - diet, wt control   Hyperlipidemia Lab Results  Component Value Date   LDLCALC 106 (H) 05/13/2023   Uncontrolled, for lower chol diet,, pt declines statin   Memory changes Seems somewhat more prominent today over 1 yr ago, was not able to address this directly today but may become a worsening element and limit her ability to comply with tx and remain in the home  Weight loss Worsening in the past yr, suspect may be limited in mobility in the home which creates barrier to food security, though states has low appetite  Bilateral hip joint arthritis Left >> right on plain films 1 yr ago, now with worsening pain less mobility, in the setting of possible worsening cognitive impairment and impaired judgement; ok for tramadol prn pain, and orthopedic referral (though not clear she intends to follow through with this)  Fall D/w pt importance of getting assistance in the home and orthopedic follow through but I suspect some impaired judgement today and stubbornness to be just left alone  Gait disorder Also for [facility] with RN, PT, and aide  Vitamin D deficiency Last vitamin D Lab Results  Component Value Date   VD25OH 50.48 05/13/2023   Stable, cont oral replacement  Edema One of her most urgent concerns besides her hip athritis pain today; likely multifactorial with elements possibly related to AI, copd, ckd3a, venous insufficiency and even swelling more on the left related to left hip DJD flare -  pt encouraged for med compliance and f/u lab  Followup: Return in about 3 months (around 08/13/2023).  Oliver Barre, MD 05/14/2023 12:45 PM [facility]

## 2023-05-13 NOTE — Patient Instructions (Addendum)
 Please take all new medication as prescribed - the tramadol for pain as needed  Please continue all other medications as before, and refills have been done to restart everything  Please have the pharmacy call with any other refills you may need.  Please continue your efforts at being more active, low cholesterol diet, and weight control.  You are otherwise up to date with prevention measures today.  Please keep your appointments with your specialists as you may have planned  You will be contacted regarding the referral for: Home health with RN , PT, and aide  You will be contacted regarding the referral for: orthopedic  Please go to the LAB at the blood drawing area for the tests to be done  You will be contacted by phone if any changes need to be made immediately.  Otherwise, you will receive a letter about your results with an explanation, but please check with MyChart first.  Please make an Appointment to return in 3 months, or sooner if needed

## 2023-05-14 ENCOUNTER — Encounter: Payer: Self-pay | Admitting: Internal Medicine

## 2023-05-14 DIAGNOSIS — M160 Bilateral primary osteoarthritis of hip: Secondary | ICD-10-CM | POA: Insufficient documentation

## 2023-05-14 DIAGNOSIS — R609 Edema, unspecified: Secondary | ICD-10-CM | POA: Insufficient documentation

## 2023-05-14 DIAGNOSIS — W19XXXA Unspecified fall, initial encounter: Secondary | ICD-10-CM | POA: Insufficient documentation

## 2023-05-14 DIAGNOSIS — E559 Vitamin D deficiency, unspecified: Secondary | ICD-10-CM | POA: Insufficient documentation

## 2023-05-14 DIAGNOSIS — R269 Unspecified abnormalities of gait and mobility: Secondary | ICD-10-CM | POA: Insufficient documentation

## 2023-05-14 NOTE — Assessment & Plan Note (Addendum)
 Seems somewhat more prominent today over 1 yr ago, was not able to address this directly today but may become a worsening element and limit her ability to comply with tx and remain in the home

## 2023-05-14 NOTE — Assessment & Plan Note (Signed)
 Stable, declines need for inhaler prn

## 2023-05-14 NOTE — Assessment & Plan Note (Signed)
 Age and sex appropriate education and counseling updated with regular exercise and diet Referrals for preventative services - pt to call for eye exam Immunizations addressed - declines all Smoking counseling  - none needed Evidence for depression or other mood disorder - none significant Most recent labs reviewed. I have personally reviewed and have noted: 1) the patient's medical and social history 2) The patient's current medications and supplements 3) The patient's height, weight, and BMI have been recorded in the chart\

## 2023-05-14 NOTE — Assessment & Plan Note (Signed)
 Lab Results  Component Value Date   LDLCALC 106 (H) 05/13/2023   Uncontrolled, for lower chol diet,, pt declines statin

## 2023-05-14 NOTE — Assessment & Plan Note (Signed)
 Also for [facility] with RN, PT, and aide

## 2023-05-14 NOTE — Assessment & Plan Note (Signed)
 Lab Results  Component Value Date   HGBA1C 6.0 05/13/2023   Stable, pt to continue current medical treatment  - diet, wt control

## 2023-05-14 NOTE — Assessment & Plan Note (Signed)
 D/w pt importance of getting assistance in the home and orthopedic follow through but I suspect some impaired judgement today and stubbornness to be just left alone

## 2023-05-14 NOTE — Assessment & Plan Note (Signed)
 One of her most urgent concerns besides her hip athritis pain today; likely multifactorial with elements possibly related to AI, copd, ckd3a, venous insufficiency and even swelling more on the left related to left hip DJD flare -  pt encouraged for med compliance and f/u lab

## 2023-05-14 NOTE — Assessment & Plan Note (Addendum)
 Worsening in the past yr, suspect may be limited in mobility in the home which creates barrier to food security, though states has low appetite

## 2023-05-14 NOTE — Assessment & Plan Note (Signed)
 Last vitamin D Lab Results  Component Value Date   VD25OH 50.48 05/13/2023   Stable, cont oral replacement

## 2023-05-14 NOTE — Assessment & Plan Note (Signed)
 BP Readings from Last 3 Encounters:  05/13/23 (!) 144/72  04/27/22 130/78  08/11/21 132/80   Uncontrolled, suspect med non compliance, pt to continue medical treatment norvasc 5 every day, labetolol 200 bid, aldactone 50 bid

## 2023-05-14 NOTE — Assessment & Plan Note (Signed)
 Lab Results  Component Value Date   VITAMINB12 73 (L) 05/13/2023   Low, to start oral replacement - b12 1000 mcg qd

## 2023-05-14 NOTE — Assessment & Plan Note (Signed)
 Left >> right on plain films 1 yr ago, now with worsening pain less mobility, in the setting of possible worsening cognitive impairment and impaired judgement; ok for tramadol prn pain, and orthopedic referral (though not clear she intends to follow through with this)

## 2023-05-14 NOTE — Assessment & Plan Note (Signed)
 Lab Results  Component Value Date   CREATININE 0.95 05/13/2023   Stable overall, cont to avoid nephrotoxins

## 2023-05-14 NOTE — Addendum Note (Signed)
 Addended by: Corwin Levins on: 05/14/2023 03:45 PM   Modules accepted: Orders

## 2023-05-17 ENCOUNTER — Telehealth: Payer: Self-pay | Admitting: *Deleted

## 2023-05-17 NOTE — Progress Notes (Unsigned)
 Care Guide Pharmacy Note  05/17/2023 Name: Megan Baldwin MRN: [id number] DOB: 06-06-1933  Referred By: Megan Levins, MD Reason for referral: Care Coordination (Outreach to schedule referral with pharmacist )   Megan Baldwin is a 89 y.o. year old female who is a primary care patient of Megan Levins, MD.  Megan Baldwin was referred to the pharmacist for assistance related to: HTN  An unsuccessful telephone outreach was attempted today to contact the patient who was referred to the pharmacy team for assistance with medication management. Additional attempts will be made to contact the patient.  Megan Baldwin, CMA, Care Guide [facility]  [facility], [facility] Guide Direct Dial: [phone]  Fax: [phone] Website: [email or web address]

## 2023-05-18 NOTE — Progress Notes (Unsigned)
 Care Guide Pharmacy Note  05/18/2023 Name: Megan Baldwin MRN: [id number] DOB: December 03, 1933  Referred By: Corwin Levins, MD Reason for referral: Care Coordination (Outreach to schedule referral with pharmacist )   Megan Baldwin is a 89 y.o. year old female who is a primary care patient of Corwin Levins, MD.  Megan Baldwin was referred to the pharmacist for assistance related to: HTN  A second unsuccessful telephone outreach was attempted today to contact the patient who was referred to the pharmacy team for assistance with medication management. Additional attempts will be made to contact the patient.  Burman Nieves, CMA, Care Guide [facility]  [facility], [facility] Guide Direct Dial: [phone]  Fax: [phone] Website: [email or web address]

## 2023-05-19 NOTE — Progress Notes (Signed)
 Care Guide Pharmacy Note  05/19/2023 Name: Megan Baldwin MRN: [id number] DOB: August 22, 1933  Referred By: Corwin Levins, MD Reason for referral: Care Coordination (Outreach to schedule referral with pharmacist )   Megan Baldwin is a 89 y.o. year old female who is a primary care patient of Corwin Levins, MD.  Megan Baldwin was referred to the pharmacist for assistance related to: HTN  A third unsuccessful telephone outreach was attempted today to contact the patient who was referred to the pharmacy team for assistance with medication management. The Population Health team is pleased to engage with this patient at any time in the future upon receipt of referral and should he/she be interested in assistance from the [facility] team.  Burman Nieves, CMA, Care Guide [facility]  [facility], [facility] Guide Direct Dial: [phone]  Fax: [phone] Website: [email or web address]

## 2023-05-25 ENCOUNTER — Telehealth: Payer: Self-pay | Admitting: Internal Medicine

## 2023-05-25 NOTE — Telephone Encounter (Signed)
 On review, we can defer the April visit to June 2025 please - thanks

## 2023-05-25 NOTE — Telephone Encounter (Signed)
 Copied from CRM [id number]. Topic: General - Other >> May 24, 2023  4:49 PM Eunice Blase wrote: Reason for CRM: Pt's daughter called please call and provide Home Health Care information from referral# [id number].Also since pt came in on 05/13/2023 does pt need to come in on 06/16/2023 at 11:30 a.m. and 1:00 p.m. please call daughter Raynah Gomes [phone]

## 2023-06-08 ENCOUNTER — Ambulatory Visit: Payer: Medicare (Managed Care) | Admitting: Physician Assistant

## 2023-06-16 ENCOUNTER — Ambulatory Visit: Payer: Medicare (Managed Care)

## 2023-06-16 ENCOUNTER — Telehealth: Payer: Self-pay | Admitting: Internal Medicine

## 2023-06-16 ENCOUNTER — Encounter: Payer: Medicare (Managed Care) | Admitting: Internal Medicine

## 2023-06-16 NOTE — Telephone Encounter (Signed)
 Perhaps a televisit might be best, thanks

## 2023-06-16 NOTE — Telephone Encounter (Signed)
 Copied from CRM [id number]. Topic: General - Other >> Jun 15, 2023  5:13 PM Megan Baldwin wrote: Reason for CRM: FELL ON FRIDAY 04/11 SHE DID NOT HURT HERSELF BUT THEY HAVE SOME CONCERNS FROM THE FAMILY IN REGARDS TO HER FALL. PHYSICAL THERAPY FROM HOME HEALTH WANTS HER TO COME TO THE CLINIC BUT SHE IS NOT ABLE TO DO SO. ORTHO CARE DOES NOT DO HOME VISITS. THE DAUGHTER IS CALLING REGARDING THIS. SHE IS REQUESTING A CALL FROM CLINIC. THE DAUGHTERS CONTACT NUMBER [phone].

## 2023-06-20 NOTE — Telephone Encounter (Signed)
 Not sure about the billing, but I normally dont do mychart visits, so we would prefer televisit if needed

## 2023-06-21 ENCOUNTER — Ambulatory Visit: Payer: Self-pay

## 2023-06-21 ENCOUNTER — Other Ambulatory Visit: Payer: Self-pay | Admitting: Internal Medicine

## 2023-06-21 DIAGNOSIS — R269 Unspecified abnormalities of gait and mobility: Secondary | ICD-10-CM

## 2023-06-21 DIAGNOSIS — W19XXXA Unspecified fall, initial encounter: Secondary | ICD-10-CM

## 2023-06-21 DIAGNOSIS — M160 Bilateral primary osteoarthritis of hip: Secondary | ICD-10-CM

## 2023-06-21 DIAGNOSIS — J449 Chronic obstructive pulmonary disease, unspecified: Secondary | ICD-10-CM

## 2023-06-21 NOTE — Telephone Encounter (Signed)
 Ok this is done

## 2023-06-21 NOTE — Telephone Encounter (Signed)
 Copied from CRM [id number]. Topic: Referral - Question >> Jun 21, 2023 10:03 AM Keitha Pata L wrote: Reason for CRM: Ortho care doctor for her hip and was advised that the referral from doctor was closed Ref# [id number]. Doctor stated that a new referral needs to be resubmitted Reason for Disposition  [1] Follow-up call from patient regarding patient's clinical status AND [2] information NON-URGENT  Answer Assessment - Initial Assessment Questions 1. REASON FOR CALL or QUESTION: "What is your reason for calling today?" or "How can I best help you?" or "What question do you have that I can help answer?"     See notes for referral information needed. 2. CALLER: Document the source of call. (e.g., laboratory, patient).     Patient  Protocols used: PCP Call - No Triage-A-AH

## 2023-06-21 NOTE — Addendum Note (Signed)
 Addended by: Roslyn Coombe on: 06/21/2023 05:10 PM   Modules accepted: Orders

## 2023-06-22 ENCOUNTER — Encounter: Payer: Self-pay | Admitting: Internal Medicine

## 2023-06-22 ENCOUNTER — Ambulatory Visit (INDEPENDENT_AMBULATORY_CARE_PROVIDER_SITE_OTHER): Payer: Medicare (Managed Care) | Admitting: Internal Medicine

## 2023-06-22 VITALS — BP 128/78 | HR 82 | Temp 99.9°F | Ht 64.0 in | Wt 145.0 lb

## 2023-06-22 DIAGNOSIS — R350 Frequency of micturition: Secondary | ICD-10-CM

## 2023-06-22 DIAGNOSIS — J449 Chronic obstructive pulmonary disease, unspecified: Secondary | ICD-10-CM | POA: Diagnosis not present

## 2023-06-22 DIAGNOSIS — R0781 Pleurodynia: Secondary | ICD-10-CM | POA: Diagnosis not present

## 2023-06-22 DIAGNOSIS — M160 Bilateral primary osteoarthritis of hip: Secondary | ICD-10-CM

## 2023-06-22 NOTE — Assessment & Plan Note (Signed)
 Overall stable, cont current inhaler

## 2023-06-22 NOTE — Assessment & Plan Note (Signed)
 Pt referred to orthopedic, also for [facility] with [facility] as apparently only agency to take the insurance

## 2023-06-22 NOTE — Progress Notes (Signed)
 Patient ID: Megan Baldwin, female   DOB: 01-04-1934, 89 y.o.   MRN: [id number]        Chief Complaint: follow up debility, left rib pain, urinary frequency, low b12, copd       HPI:  Megan Baldwin is a 89 y.o. female here with c/o 3 days urinary frequency but Denies urinary symptoms such as dysuria, urgency, flank pain, hematuria or n/v, fever, chills.    Pt denies polydipsia, polyuria, or new focal neuro s/s.    Pt denies night sweats, loss of appetite, or other constitutional symptoms , though has lost significant wt.  Pt staying by herself, HH not started due to insurance issue.  Pt with memory issue but likely fell last wk, now has soreness to touch to the left anterolateral chest wall without bruising or swelling   Also has a scratch it seems to the RUQ area about 1/2 x 1/2 cm but scabbed and appears to be healing.  No other decubiti.  Has walker at home , riser for commode, but needs shower chair.  Wt Readings from Last 3 Encounters:  06/22/23 145 lb (65.8 kg)  05/13/23 147 lb (66.7 kg)  04/27/22 170 lb (77.1 kg)   BP Readings from Last 3 Encounters:  06/22/23 128/78  05/13/23 (!) 144/72  04/27/22 130/78         Past Medical History:  Diagnosis Date   Allergic rhinitis    Anxiety    Aortic valve regurgitation 10/12/2010   CKD (chronic kidney disease) stage 3, GFR 30-59 ml/min (HCC) 09/26/2017   COPD (chronic obstructive pulmonary disease) (HCC)    Disc disease, degenerative, cervical    Diverticulosis of colon    DJD (degenerative joint disease)    LS spine   Gallstones    History of colonic polyps    Hyperlipidemia    Hypertension    Osteoporosis    Past Surgical History:  Procedure Laterality Date   ABDOMINAL HYSTERECTOMY     CATARACT EXTRACTION     COLONOSCOPY W/ POLYPECTOMY     EYE SURGERY     OOPHORECTOMY      reports that she has quit smoking. She has never used smokeless tobacco. She reports that she does not drink alcohol and does not use drugs. family history  includes Cancer in her mother and another family member; Coronary artery disease in an other family member; Diabetes in her mother and another family member; Hypertension in an other family member; Parkinsonism in her father; Stroke in an other family member. No Known Allergies Current Outpatient Medications on File Prior to Visit  Medication Sig Dispense Refill   amLODipine  (NORVASC ) 5 MG tablet Take 1 tablet (5 mg total) by mouth daily. 90 tablet 3   aspirin 325 MG EC tablet Take 325 mg by mouth daily.     diazepam  (VALIUM ) 5 MG tablet TAKE 1 TABLET (5 MG TOTAL) BY MOUTH EVERY 12 (TWELVE) HOURS AS NEEDED FOR ANXIETY. 60 tablet 2   labetalol  (NORMODYNE ) 200 MG tablet Take 1 tablet (200 mg total) by mouth 2 (two) times daily. 180 tablet 3   spironolactone  (ALDACTONE ) 50 MG tablet Take 1 tablet (50 mg total) by mouth 2 (two) times daily. 180 tablet 3   traMADol  (ULTRAM ) 50 MG tablet Take 1 tablet (50 mg total) by mouth every 6 (six) hours as needed. 60 tablet 2   vitamin B-12 (CYANOCOBALAMIN ) 1000 MCG tablet Take 1 tablet (1,000 mcg total) by mouth daily. 90 tablet 3  No current facility-administered medications on file prior to visit.        ROS:  All others reviewed and negative.  Objective        PE:  BP 128/78 (BP Location: Right Arm, Patient Position: Sitting, Cuff Size: Normal)   Pulse 82   Temp 99.9 F (37.7 C) (Oral)   Ht 5\' 4"  (1.626 m)   Wt 145 lb (65.8 kg)   SpO2 98%   BMI 24.89 kg/m                 Constitutional: Pt appears in NAD               HENT: Head: NCAT.                Right Ear: External ear normal.                 Left Ear: External ear normal.                Eyes: . Pupils are equal, round, and reactive to light. Conjunctivae and EOM are normal               Nose: without d/c or deformity               Neck: Neck supple. Gross normal ROM               Cardiovascular: Normal rate and regular rhythm.                 Pulmonary/Chest: Effort normal and breath  sounds decreased without rales or wheezing.                Abd:  Soft, NT, ND, + BS, no organomegaly, tender left lower anterolateral ribs               Neurological: Pt is alert. At baseline orientation, motor grossly intact               Skin: Skin is warm. No rashes, no other new lesions, LE edema - chronic 1+               Psychiatric: Pt behavior is normal without agitation   Micro: none  Cardiac tracings I have personally interpreted today:  none  Pertinent Radiological findings (summarize): none   Lab Results  Component Value Date   WBC 4.3 05/13/2023   HGB 11.7 (L) 05/13/2023   HCT 34.9 (L) 05/13/2023   PLT 123.0 (L) 05/13/2023   GLUCOSE 94 05/13/2023   CHOL 198 05/13/2023   TRIG 90.0 05/13/2023   HDL 73.20 05/13/2023   LDLDIRECT 121.4 04/10/2013   LDLCALC 106 (H) 05/13/2023   ALT 14 05/13/2023   AST 23 05/13/2023   NA 136 05/13/2023   K 3.7 05/13/2023   CL 104 05/13/2023   CREATININE 0.95 05/13/2023   BUN 12 05/13/2023   CO2 23 05/13/2023   TSH 0.80 05/13/2023   HGBA1C 6.0 05/13/2023   MICROALBUR 4.4 (H) 04/27/2022   Assessment/Plan:  Megan Baldwin is a 89 y.o. Black or African American [2] female with  has a past medical history of Allergic rhinitis, Anxiety, Aortic valve regurgitation (10/12/2010), CKD (chronic kidney disease) stage 3, GFR 30-59 ml/min (HCC) (09/26/2017), COPD (chronic obstructive pulmonary disease) (HCC), Disc disease, degenerative, cervical, Diverticulosis of colon, DJD (degenerative joint disease), Gallstones, History of colonic polyps, Hyperlipidemia, Hypertension, and Osteoporosis.  COPD (chronic obstructive pulmonary disease) (HCC) Overall stable, cont current inhaler  Rib  pain on left side With possible fall recently, declines xray today  Bilateral hip joint arthritis Pt referred to orthopedic, also for [facility] with [facility] as apparently only agency to take the insurance  Urinary frequency Can't r/o uti - for ua and cx  Followup:  Return in about 3 months (around 09/21/2023).  Rosalia Colonel, MD 06/22/2023 2:16 PM [facility]

## 2023-06-22 NOTE — Assessment & Plan Note (Signed)
 Can't r/o uti - for ua and cx

## 2023-06-22 NOTE — Assessment & Plan Note (Signed)
 With possible fall recently, declines xray today

## 2023-06-22 NOTE — Addendum Note (Signed)
 Addended by: Roslyn Coombe on: 06/22/2023 02:57 PM   Modules accepted: Orders

## 2023-06-22 NOTE — Patient Instructions (Addendum)
 Ok to take tylenol as needed or SalonPaz topical for the rib pain  Please continue all other medications as before, and refills have been done if requested.  Please have the pharmacy call with any other refills you may need.  Please keep your appointments with your specialists as you may have planned  You will be contacted regarding the referral for: orthopedic for the possible hip injection, and [facility] for Home health  Please go to the LAB at the blood drawing area for the tests to be done - just the urine testing today  You will be contacted by phone if any changes need to be made immediately.  Otherwise, you will receive a letter about your results with an explanation, but please check with MyChart first.  You are given the Shower Chair script today to give to [facility]  Please make an Appointment to return in 3 months, or sooner if needed

## 2023-06-23 ENCOUNTER — Telehealth: Payer: Self-pay | Admitting: *Deleted

## 2023-06-23 NOTE — Progress Notes (Signed)
 Complex Care Management Note  Care Guide Note 06/23/2023 Name: Megan Baldwin MRN: [id number] DOB: November 25, 1933  [name] is a 89 y.o. year old female who sees Roslyn Coombe, MD for primary care. I reached out to Lonzo Robinson by phone today to offer complex care management services.  [name] was given information about Complex Care Management services today including:   The Complex Care Management services include support from the care team which includes your Nurse Care Manager, Clinical Social Worker, or Pharmacist.  The Complex Care Management team is here to help remove barriers to the health concerns and goals most important to you. Complex Care Management services are voluntary, and the patient may decline or stop services at any time by request to their care team member.   Complex Care Management Consent Status: Patient agreed to services and verbal consent obtained.   Follow up plan:  Telephone appointment with complex care management team member scheduled for:  07/08/2023  Encounter Outcome:  Patient Scheduled  Kandis Ormond, CMA [place]  [facility], [facility] Guide Direct Dial: [phone]  Fax: [phone] Website: [email or web address]

## 2023-07-01 DIAGNOSIS — Z91148 Patient's other noncompliance with medication regimen for other reason: Secondary | ICD-10-CM | POA: Diagnosis not present

## 2023-07-01 DIAGNOSIS — R739 Hyperglycemia, unspecified: Secondary | ICD-10-CM | POA: Diagnosis not present

## 2023-07-01 DIAGNOSIS — Z556 Problems related to health literacy: Secondary | ICD-10-CM | POA: Diagnosis not present

## 2023-07-01 DIAGNOSIS — Z7982 Long term (current) use of aspirin: Secondary | ICD-10-CM | POA: Diagnosis not present

## 2023-07-01 DIAGNOSIS — Z79891 Long term (current) use of opiate analgesic: Secondary | ICD-10-CM | POA: Diagnosis not present

## 2023-07-01 DIAGNOSIS — M160 Bilateral primary osteoarthritis of hip: Secondary | ICD-10-CM | POA: Diagnosis not present

## 2023-07-01 DIAGNOSIS — Z602 Problems related to living alone: Secondary | ICD-10-CM | POA: Diagnosis not present

## 2023-07-01 DIAGNOSIS — Z860100 Personal history of colon polyps, unspecified: Secondary | ICD-10-CM | POA: Diagnosis not present

## 2023-07-01 DIAGNOSIS — E785 Hyperlipidemia, unspecified: Secondary | ICD-10-CM | POA: Diagnosis not present

## 2023-07-01 DIAGNOSIS — Z90710 Acquired absence of both cervix and uterus: Secondary | ICD-10-CM | POA: Diagnosis not present

## 2023-07-01 DIAGNOSIS — R634 Abnormal weight loss: Secondary | ICD-10-CM | POA: Diagnosis not present

## 2023-07-01 DIAGNOSIS — E538 Deficiency of other specified B group vitamins: Secondary | ICD-10-CM | POA: Diagnosis not present

## 2023-07-01 DIAGNOSIS — Z9849 Cataract extraction status, unspecified eye: Secondary | ICD-10-CM | POA: Diagnosis not present

## 2023-07-08 ENCOUNTER — Other Ambulatory Visit: Payer: Medicare (Managed Care) | Admitting: *Deleted

## 2023-07-08 NOTE — Patient Outreach (Addendum)
 Complex Care Management   Visit Note  07/08/2023  Name:  Megan Baldwin MRN: [id number] DOB: 1933-06-21  Situation: Referral received for Complex Care Management related to COPD I obtained verbal consent from daughter.  Visit completed with daughter  on the phone  Background:   Past Medical History:  Diagnosis Date   Allergic rhinitis    Anxiety    Aortic valve regurgitation 10/12/2010   CKD (chronic kidney disease) stage 3, GFR 30-59 ml/min (HCC) 09/26/2017   COPD (chronic obstructive pulmonary disease) (HCC)    Disc disease, degenerative, cervical    Diverticulosis of colon    DJD (degenerative joint disease)    LS spine   Gallstones    History of colonic polyps    Hyperlipidemia    Hypertension    Osteoporosis     Assessment: Patient Reported Symptoms:  Cognitive Cognitive Status: Alert and oriented to person, place, and time, Normal speech and language skills      Neurological Neurological Review of Symptoms: No symptoms reported    HEENT HEENT Symptoms Reported: No symptoms reported      Cardiovascular Cardiovascular Symptoms Reported: No symptoms reported    Respiratory Respiratory Symptoms Reported: No symptoms reported Other Respiratory Symptoms: labored with excertion    Endocrine Patient reports the following symptoms related to hypoglycemia or hyperglycemia : No symptoms reported Is patient diabetic?: No    Gastrointestinal Gastrointestinal Symptoms Reported: Incontinence Gastrointestinal Management Strategies: Incontinence garment/pad Nutrition Risk Screen (CP): No indicators present  Genitourinary Genitourinary Symptoms Reported: Incontinence Genitourinary Conditions: Incontinence  Integumentary Integumentary Symptoms Reported: No symptoms reported    Musculoskeletal Musculoskelatal Symptoms Reviewed: Difficulty walking, Unsteady gait Musculoskeletal Conditions: Mobility limited      Psychosocial Psychosocial Symptoms Reported: Sadness - if selected  complete PHQ 2-9 Additional Psychological Details: Per daughter, she has the wlll to take care of herself Behavioral Management Strategies: Adequate rest, Support group Behavioral Health Comment: family check on her regularly, friends also visit Major Change/Loss/Stressor/Fears (CP): Medical condition, self Quality of Family Relationships: supportive Do you feel physically threatened by others?: No      07/08/2023   10:57 AM  Depression screen PHQ 2/9  Decreased Interest 0  Down, Depressed, Hopeless 0  PHQ - 2 Score 0    There were no vitals filed for this visit.  Medications Reviewed Today     Reviewed by Ave Leisure, LCSW (Social Worker) on 07/08/23 at 1045  Med List Status: <None>   Medication Order Taking? Sig Documenting Provider Last Dose Status Informant  amLODipine  (NORVASC ) 5 MG tablet [id number] Yes Take 1 tablet (5 mg total) by mouth daily. Roslyn Coombe, MD Taking Active   aspirin 325 MG EC tablet [id number] No Take 325 mg by mouth daily.  Patient not taking: Reported on 07/08/2023   [provider] Not Taking Active   diazepam  (VALIUM ) 5 MG tablet [id number] No TAKE 1 TABLET (5 MG TOTAL) BY MOUTH EVERY 12 (TWELVE) HOURS AS NEEDED FOR ANXIETY.  Patient not taking: Reported on 07/08/2023   Roslyn Coombe, MD Not Taking Active   labetalol  (NORMODYNE ) 200 MG tablet [id number] Yes Take 1 tablet (200 mg total) by mouth 2 (two) times daily. Roslyn Coombe, MD Taking Active   spironolactone  (ALDACTONE ) 50 MG tablet [id number] Yes Take 1 tablet (50 mg total) by mouth 2 (two) times daily. Roslyn Coombe, MD Taking Active   traMADol  (ULTRAM ) 50 MG tablet [id number] Yes Take 1 tablet (50 mg total) by mouth every 6 (  six) hours as needed. Roslyn Coombe, MD Taking Active   vitamin B-12 (CYANOCOBALAMIN ) 1000 MCG tablet [id number] Yes Take 1 tablet (1,000 mcg total) by mouth daily. Roslyn Coombe, MD Taking Active             Recommendation:   PCP Follow-up  Follow Up Plan:    Telephone follow-up 07/26/23  Michaelle Adolphus, LCSW [place]  [facility], [facility] Licensed Clinical Social Worker Care Coordinator  Direct Dial: [phone]

## 2023-07-08 NOTE — Patient Instructions (Addendum)
 Visit Information  Thank you for taking time to visit with me today. Please don't hesitate to contact me if I can be of assistance to you before our next scheduled appointment.  Our next appointment is by telephone on 07/26/23 at 9:30am Please call the care guide team at [phone] if you need to cancel or reschedule your appointment.   Following is a copy of your care plan:   Goals Addressed             This Visit's Progress    VBCI Social Work Care Plan       Problems:   Lacks knowledge of how to connect to community resources  CSW Clinical Goal(s):   Over the next 90 days the Caregiver will explore community resource options for unmet needs related to community resources to assist with overall care needs.  Interventions:  Level of Care Concerns in a patient with COPD Current level of care: home, alone Evaluation of patient's unmet needs in current living environment ADL's Assessed needs, level of care concerns, how currently meeting needs and barriers to care Discuss community support options (home based care option-CSW will call to complete referral) Meals on wheels (program discussed, referral completed) Provided list of private duty care agencies and anticipated costs Confirmed that patient is currently received HH services through [facility] RN, PT, OT, Speech and bath aid Confirmed temporary plan for family friends and relatives to assist with patient's daily care, however long term care plan needed  Facility  Assessed needs and reviewed facility placement process; as well as the different levels of care Discussed payment options for placement :including anticipated out of pocket costs Discussed Special Assistance Medicaid and application process Provided a list of facilities based on level of care needs for review Reviewed facility placement process :facility care currently being considered as a possible option  Advance Directive See [employer] application for related  entries  Active listening / Reflection utilized Caregiver stress acknowledged  Emotional Support Provided  Patient Goals/Self-Care Activities:  Consider applying for [email or web address]               Review resources for additional community resources to assist with patient's       care needs once received through email   Plan:   Telephone follow up appointment with care management team member scheduled for:  07/26/23        Please call 911 if you are experiencing a Mental Health or Behavioral Health Crisis or need someone to talk to.  Patient verbalizes understanding of instructions and care plan provided today and agrees to view in MyChart. Active MyChart status and patient understanding of how to access instructions and care plan via MyChart confirmed with patient.     Kaori Jumper, LCSW [place]  [facility], [facility] Licensed Clinical Social Worker Care Coordinator  Direct Dial: [phone]

## 2023-07-14 ENCOUNTER — Emergency Department (HOSPITAL_COMMUNITY): Payer: Medicare (Managed Care)

## 2023-07-14 ENCOUNTER — Other Ambulatory Visit: Payer: Self-pay

## 2023-07-14 ENCOUNTER — Inpatient Hospital Stay (HOSPITAL_COMMUNITY)
Admission: EM | Admit: 2023-07-14 | Discharge: 2023-07-22 | DRG: 689 | Disposition: A | Discharge status: Skilled Nursing Facility | Payer: Medicare (Managed Care) | Attending: Family Medicine | Admitting: Family Medicine

## 2023-07-14 ENCOUNTER — Encounter (HOSPITAL_COMMUNITY): Payer: Self-pay

## 2023-07-14 DIAGNOSIS — I2699 Other pulmonary embolism without acute cor pulmonale: Secondary | ICD-10-CM | POA: Diagnosis not present

## 2023-07-14 DIAGNOSIS — N3000 Acute cystitis without hematuria: Secondary | ICD-10-CM | POA: Diagnosis not present

## 2023-07-14 DIAGNOSIS — J449 Chronic obstructive pulmonary disease, unspecified: Secondary | ICD-10-CM | POA: Diagnosis present

## 2023-07-14 DIAGNOSIS — R262 Difficulty in walking, not elsewhere classified: Secondary | ICD-10-CM | POA: Diagnosis not present

## 2023-07-14 DIAGNOSIS — Z823 Family history of stroke: Secondary | ICD-10-CM

## 2023-07-14 DIAGNOSIS — I82503 Chronic embolism and thrombosis of unspecified deep veins of lower extremity, bilateral: Secondary | ICD-10-CM | POA: Diagnosis not present

## 2023-07-14 DIAGNOSIS — Z751 Person awaiting admission to adequate facility elsewhere: Secondary | ICD-10-CM

## 2023-07-14 DIAGNOSIS — F29 Unspecified psychosis not due to a substance or known physiological condition: Secondary | ICD-10-CM | POA: Diagnosis not present

## 2023-07-14 DIAGNOSIS — G3184 Mild cognitive impairment, so stated: Secondary | ICD-10-CM | POA: Diagnosis not present

## 2023-07-14 DIAGNOSIS — Z87891 Personal history of nicotine dependence: Secondary | ICD-10-CM

## 2023-07-14 DIAGNOSIS — F03A0 Unspecified dementia, mild, without behavioral disturbance, psychotic disturbance, mood disturbance, and anxiety: Secondary | ICD-10-CM | POA: Diagnosis present

## 2023-07-14 DIAGNOSIS — I129 Hypertensive chronic kidney disease with stage 1 through stage 4 chronic kidney disease, or unspecified chronic kidney disease: Secondary | ICD-10-CM | POA: Diagnosis not present

## 2023-07-14 DIAGNOSIS — Z515 Encounter for palliative care: Secondary | ICD-10-CM

## 2023-07-14 DIAGNOSIS — D696 Thrombocytopenia, unspecified: Secondary | ICD-10-CM | POA: Diagnosis present

## 2023-07-14 DIAGNOSIS — R7989 Other specified abnormal findings of blood chemistry: Secondary | ICD-10-CM

## 2023-07-14 DIAGNOSIS — D649 Anemia, unspecified: Secondary | ICD-10-CM | POA: Diagnosis not present

## 2023-07-14 DIAGNOSIS — Z7901 Long term (current) use of anticoagulants: Secondary | ICD-10-CM | POA: Diagnosis not present

## 2023-07-14 DIAGNOSIS — I82441 Acute embolism and thrombosis of right tibial vein: Secondary | ICD-10-CM | POA: Diagnosis not present

## 2023-07-14 DIAGNOSIS — I700 Atherosclerosis of aorta: Secondary | ICD-10-CM | POA: Diagnosis present

## 2023-07-14 DIAGNOSIS — M160 Bilateral primary osteoarthritis of hip: Secondary | ICD-10-CM | POA: Diagnosis not present

## 2023-07-14 DIAGNOSIS — I2782 Chronic pulmonary embolism: Secondary | ICD-10-CM | POA: Diagnosis not present

## 2023-07-14 DIAGNOSIS — Z7401 Bed confinement status: Secondary | ICD-10-CM | POA: Diagnosis not present

## 2023-07-14 DIAGNOSIS — I82413 Acute embolism and thrombosis of femoral vein, bilateral: Secondary | ICD-10-CM | POA: Diagnosis present

## 2023-07-14 DIAGNOSIS — R404 Transient alteration of awareness: Secondary | ICD-10-CM | POA: Diagnosis not present

## 2023-07-14 DIAGNOSIS — E538 Deficiency of other specified B group vitamins: Secondary | ICD-10-CM | POA: Diagnosis not present

## 2023-07-14 DIAGNOSIS — N183 Chronic kidney disease, stage 3 unspecified: Secondary | ICD-10-CM | POA: Diagnosis not present

## 2023-07-14 DIAGNOSIS — R41841 Cognitive communication deficit: Secondary | ICD-10-CM | POA: Diagnosis not present

## 2023-07-14 DIAGNOSIS — I824Y2 Acute embolism and thrombosis of unspecified deep veins of left proximal lower extremity: Secondary | ICD-10-CM | POA: Diagnosis not present

## 2023-07-14 DIAGNOSIS — E785 Hyperlipidemia, unspecified: Secondary | ICD-10-CM | POA: Diagnosis not present

## 2023-07-14 DIAGNOSIS — Z79899 Other long term (current) drug therapy: Secondary | ICD-10-CM

## 2023-07-14 DIAGNOSIS — R079 Chest pain, unspecified: Secondary | ICD-10-CM | POA: Diagnosis not present

## 2023-07-14 DIAGNOSIS — I82431 Acute embolism and thrombosis of right popliteal vein: Secondary | ICD-10-CM | POA: Diagnosis present

## 2023-07-14 DIAGNOSIS — E1122 Type 2 diabetes mellitus with diabetic chronic kidney disease: Secondary | ICD-10-CM | POA: Diagnosis not present

## 2023-07-14 DIAGNOSIS — R296 Repeated falls: Secondary | ICD-10-CM | POA: Diagnosis present

## 2023-07-14 DIAGNOSIS — R410 Disorientation, unspecified: Secondary | ICD-10-CM | POA: Diagnosis not present

## 2023-07-14 DIAGNOSIS — Z9849 Cataract extraction status, unspecified eye: Secondary | ICD-10-CM

## 2023-07-14 DIAGNOSIS — Z743 Need for continuous supervision: Secondary | ICD-10-CM | POA: Diagnosis not present

## 2023-07-14 DIAGNOSIS — I351 Nonrheumatic aortic (valve) insufficiency: Secondary | ICD-10-CM | POA: Diagnosis present

## 2023-07-14 DIAGNOSIS — I10 Essential (primary) hypertension: Secondary | ICD-10-CM | POA: Diagnosis present

## 2023-07-14 DIAGNOSIS — M51369 Other intervertebral disc degeneration, lumbar region without mention of lumbar back pain or lower extremity pain: Secondary | ICD-10-CM | POA: Diagnosis not present

## 2023-07-14 DIAGNOSIS — Z9181 History of falling: Secondary | ICD-10-CM

## 2023-07-14 DIAGNOSIS — D631 Anemia in chronic kidney disease: Secondary | ICD-10-CM | POA: Diagnosis present

## 2023-07-14 DIAGNOSIS — F419 Anxiety disorder, unspecified: Secondary | ICD-10-CM | POA: Diagnosis not present

## 2023-07-14 DIAGNOSIS — Z8249 Family history of ischemic heart disease and other diseases of the circulatory system: Secondary | ICD-10-CM

## 2023-07-14 DIAGNOSIS — I82451 Acute embolism and thrombosis of right peroneal vein: Secondary | ICD-10-CM | POA: Diagnosis present

## 2023-07-14 DIAGNOSIS — R278 Other lack of coordination: Secondary | ICD-10-CM | POA: Diagnosis not present

## 2023-07-14 DIAGNOSIS — I872 Venous insufficiency (chronic) (peripheral): Secondary | ICD-10-CM | POA: Diagnosis present

## 2023-07-14 DIAGNOSIS — E663 Overweight: Secondary | ICD-10-CM | POA: Diagnosis present

## 2023-07-14 DIAGNOSIS — I824Z3 Acute embolism and thrombosis of unspecified deep veins of distal lower extremity, bilateral: Secondary | ICD-10-CM | POA: Diagnosis not present

## 2023-07-14 DIAGNOSIS — G9340 Encephalopathy, unspecified: Secondary | ICD-10-CM | POA: Diagnosis not present

## 2023-07-14 DIAGNOSIS — R0689 Other abnormalities of breathing: Secondary | ICD-10-CM | POA: Diagnosis not present

## 2023-07-14 DIAGNOSIS — G9341 Metabolic encephalopathy: Secondary | ICD-10-CM | POA: Diagnosis not present

## 2023-07-14 DIAGNOSIS — Z833 Family history of diabetes mellitus: Secondary | ICD-10-CM

## 2023-07-14 DIAGNOSIS — M810 Age-related osteoporosis without current pathological fracture: Secondary | ICD-10-CM | POA: Diagnosis not present

## 2023-07-14 LAB — I-STAT CHEM 8, ED
BUN: 20 mg/dL (ref 8–23)
Calcium, Ion: 1.18 mmol/L (ref 1.15–1.40)
Chloride: 105 mmol/L (ref 98–111)
Creatinine, Ser: 1 mg/dL (ref 0.44–1.00)
Glucose, Bld: 78 mg/dL (ref 70–99)
HCT: 30 % — ABNORMAL LOW (ref 36.0–46.0)
Hemoglobin: 10.2 g/dL — ABNORMAL LOW (ref 12.0–15.0)
Potassium: 4.1 mmol/L (ref 3.5–5.1)
Sodium: 135 mmol/L (ref 135–145)
TCO2: 20 mmol/L — ABNORMAL LOW (ref 22–32)

## 2023-07-14 LAB — CBC WITH DIFFERENTIAL/PLATELET
Abs Immature Granulocytes: 0.03 10*3/uL (ref 0.00–0.07)
Basophils Absolute: 0 10*3/uL (ref 0.0–0.1)
Basophils Relative: 0 %
Eosinophils Absolute: 0.1 10*3/uL (ref 0.0–0.5)
Eosinophils Relative: 2 %
HCT: 29.2 % — ABNORMAL LOW (ref 36.0–46.0)
Hemoglobin: 9.4 g/dL — ABNORMAL LOW (ref 12.0–15.0)
Immature Granulocytes: 1 %
Lymphocytes Relative: 22 %
Lymphs Abs: 1.2 10*3/uL (ref 0.7–4.0)
MCH: 30.1 pg (ref 26.0–34.0)
MCHC: 32.2 g/dL (ref 30.0–36.0)
MCV: 93.6 fL (ref 80.0–100.0)
Monocytes Absolute: 0.7 10*3/uL (ref 0.1–1.0)
Monocytes Relative: 13 %
Neutro Abs: 3.4 10*3/uL (ref 1.7–7.7)
Neutrophils Relative %: 62 %
Platelets: 69 10*3/uL — ABNORMAL LOW (ref 150–400)
RBC: 3.12 MIL/uL — ABNORMAL LOW (ref 3.87–5.11)
RDW: 15.9 % — ABNORMAL HIGH (ref 11.5–15.5)
WBC: 5.4 10*3/uL (ref 4.0–10.5)
nRBC: 0 % (ref 0.0–0.2)

## 2023-07-14 LAB — URINALYSIS, W/ REFLEX TO CULTURE (INFECTION SUSPECTED)
Bilirubin Urine: NEGATIVE
Glucose, UA: NEGATIVE mg/dL
Hgb urine dipstick: NEGATIVE
Ketones, ur: NEGATIVE mg/dL
Nitrite: NEGATIVE
Protein, ur: NEGATIVE mg/dL
Specific Gravity, Urine: 1.014 (ref 1.005–1.030)
pH: 6 (ref 5.0–8.0)

## 2023-07-14 LAB — COMPREHENSIVE METABOLIC PANEL WITH GFR
ALT: 17 U/L (ref 0–44)
AST: 28 U/L (ref 15–41)
Albumin: 3.6 g/dL (ref 3.5–5.0)
Alkaline Phosphatase: 65 U/L (ref 38–126)
Anion gap: 10 (ref 5–15)
BUN: 21 mg/dL (ref 8–23)
CO2: 21 mmol/L — ABNORMAL LOW (ref 22–32)
Calcium: 9.2 mg/dL (ref 8.9–10.3)
Chloride: 103 mmol/L (ref 98–111)
Creatinine, Ser: 1.01 mg/dL — ABNORMAL HIGH (ref 0.44–1.00)
GFR, Estimated: 54 mL/min — ABNORMAL LOW (ref >60)
Glucose, Bld: 83 mg/dL (ref 70–99)
Potassium: 4 mmol/L (ref 3.5–5.1)
Sodium: 134 mmol/L — ABNORMAL LOW (ref 135–145)
Total Bilirubin: 1 mg/dL (ref 0.0–1.2)
Total Protein: 7.5 g/dL (ref 6.5–8.1)

## 2023-07-14 LAB — I-STAT CG4 LACTIC ACID, ED: Lactic Acid, Venous: 1.4 mmol/L (ref 0.5–1.9)

## 2023-07-14 LAB — AMMONIA: Ammonia: <13 umol/L (ref 9–35)

## 2023-07-14 LAB — ETHANOL: Alcohol, Ethyl (B): <15 mg/dL (ref <15)

## 2023-07-14 LAB — TSH: TSH: 0.901 u[IU]/mL (ref 0.350–4.500)

## 2023-07-14 MED ORDER — SENNOSIDES-DOCUSATE SODIUM 8.6-50 MG PO TABS: 1.0000 | ORAL_TABLET | Freq: Every evening | ORAL | Status: DC | PRN

## 2023-07-14 MED ORDER — SODIUM CHLORIDE 0.9 % IV SOLN
1.0000 g | Freq: Once | INTRAVENOUS | Status: AC
  Administered 2023-07-14: 1 g via INTRAVENOUS
  Filled 2023-07-14: qty 10

## 2023-07-14 MED ORDER — ONDANSETRON HCL 4 MG/2ML IJ SOLN: 4.0000 mg | Freq: Four times a day (QID) | INTRAMUSCULAR | Status: DC | PRN

## 2023-07-14 MED ORDER — AMLODIPINE BESYLATE 5 MG PO TABS
5.0000 mg | ORAL_TABLET | Freq: Every day | ORAL | Status: DC
  Administered 2023-07-14 – 2023-07-22 (×8): 5 mg via ORAL
  Filled 2023-07-14 (×9): qty 1

## 2023-07-14 MED ORDER — SODIUM CHLORIDE 0.9 % IV BOLUS
1000.0000 mL | Freq: Once | INTRAVENOUS | Status: AC
  Administered 2023-07-14: 1000 mL via INTRAVENOUS

## 2023-07-14 MED ORDER — DICLOFENAC SODIUM 1 % EX GEL
2.0000 g | Freq: Four times a day (QID) | CUTANEOUS | Status: DC
  Administered 2023-07-14 – 2023-07-22 (×23): 2 g via TOPICAL
  Filled 2023-07-14: qty 100

## 2023-07-14 MED ORDER — ENOXAPARIN SODIUM 40 MG/0.4ML IJ SOSY: 40.0000 mg | PREFILLED_SYRINGE | INTRAMUSCULAR | Status: DC

## 2023-07-14 MED ORDER — IPRATROPIUM-ALBUTEROL 0.5-2.5 (3) MG/3ML IN SOLN: 3.0000 mL | Freq: Four times a day (QID) | RESPIRATORY_TRACT | Status: DC | PRN

## 2023-07-14 MED ORDER — ACETAMINOPHEN 650 MG RE SUPP: 650.0000 mg | Freq: Four times a day (QID) | RECTAL | Status: DC | PRN

## 2023-07-14 MED ORDER — CYANOCOBALAMIN 1000 MCG/ML IJ SOLN
1000.0000 ug | Freq: Every day | INTRAMUSCULAR | Status: AC
  Administered 2023-07-14 – 2023-07-16 (×3): 1000 ug via INTRAMUSCULAR
  Filled 2023-07-14 (×3): qty 1

## 2023-07-14 MED ORDER — ONDANSETRON HCL 4 MG PO TABS: 4.0000 mg | ORAL_TABLET | Freq: Four times a day (QID) | ORAL | Status: DC | PRN

## 2023-07-14 MED ORDER — ACETAMINOPHEN 500 MG PO TABS
1000.0000 mg | ORAL_TABLET | Freq: Three times a day (TID) | ORAL | Status: DC
  Administered 2023-07-14 – 2023-07-22 (×23): 1000 mg via ORAL
  Filled 2023-07-14 (×23): qty 2

## 2023-07-14 MED ORDER — ACETAMINOPHEN 325 MG PO TABS: 650.0000 mg | ORAL_TABLET | Freq: Four times a day (QID) | ORAL | Status: DC | PRN

## 2023-07-14 MED ORDER — SODIUM CHLORIDE 0.9 % IV SOLN
1.0000 g | INTRAVENOUS | Status: DC
  Administered 2023-07-15: 1 g via INTRAVENOUS
  Filled 2023-07-14: qty 10

## 2023-07-14 NOTE — ED Provider Notes (Signed)
 [place] EMERGENCY DEPARTMENT AT [facility] Provider Note  CSN: [id number] Arrival date & time: 07/14/23 1605  Chief Complaint(s) Aggressive Behavior  HPI Megan Baldwin is a 89 y.o. female history of COPD, CKD, hypertension, hyperlipidemia presenting to the emergency department with behavior change.  Per paramedics, patient family called paramedics as they were concerned about UTI and increased confusion.  Patient was reluctant to come to the hospital when they tried to put patient on the stretcher she tried to bite the paramedics and they gave her Versed.  They report after the Versed she was much more pleasant.  The patient currently denies any symptoms but seems confused.  She denies any pain.  History limited due to altered mental status.   Past Medical History Past Medical History:  Diagnosis Date   Allergic rhinitis    Anxiety    Aortic valve regurgitation 10/12/2010   CKD (chronic kidney disease) stage 3, GFR 30-59 ml/min (HCC) 09/26/2017   COPD (chronic obstructive pulmonary disease) (HCC)    Disc disease, degenerative, cervical    Diverticulosis of colon    DJD (degenerative joint disease)    LS spine   Gallstones    History of colonic polyps    Hyperlipidemia    Hypertension    Osteoporosis    Patient Active Problem List   Diagnosis Date Noted   Acute metabolic encephalopathy 07/14/2023   Urinary frequency 06/22/2023   Bilateral hip joint arthritis 05/14/2023   Fall 05/14/2023   Gait disorder 05/14/2023   Vitamin D  deficiency 05/14/2023   Edema 05/14/2023   Memory changes 04/27/2022   Recurrent falls 04/27/2022   Rib pain on left side 04/27/2022   Primary osteoarthritis of left hip 04/22/2021   Weight loss 06/03/2020   B12 deficiency 06/03/2020   Depression 01/21/2019   Vertigo 04/19/2018   Hyperglycemia 09/26/2017   CKD (chronic kidney disease) stage 3, GFR 30-59 ml/min (HCC) 09/26/2017   Dizziness 05/03/2014   Bilateral hearing loss  12/19/2013   COPD exacerbation (HCC) 04/10/2013   Pain, joint, shoulder region, left 12/21/2012   Aortic valve regurgitation 10/12/2010   Encounter for well adult exam with abnormal findings 10/12/2010   COPD (chronic obstructive pulmonary disease) (HCC) 05/01/2009   SHOULDER PAIN, RIGHT 04/18/2009   NECK PAIN, RIGHT 04/18/2009   PARESTHESIA 08/09/2008   LUNG NODULE 08/02/2007   Hyperlipidemia 12/30/2006   Anxiety state 12/30/2006   Essential hypertension 12/30/2006   Allergic rhinitis 12/30/2006   Diverticulosis of colon 12/30/2006   Osteopenia 12/30/2006   History of colonic polyps 12/30/2006   Calculus of gallbladder 12/29/2006   OSTEOARTHRITIS, LUMBOSACRAL SPINE 12/29/2006   DEGENERATIVE DISC DISEASE, CERVICAL SPINE 12/29/2006   COMPUTERIZED TOMOGRAPHY, CHEST, ABNORMAL 12/29/2006   Home Medication(s) Prior to Admission medications   Medication Sig Start Date End Date Taking? Authorizing Provider  amLODipine  (NORVASC ) 5 MG tablet Take 1 tablet (5 mg total) by mouth daily. 05/13/23  Yes Roslyn Coombe, MD  labetalol  (NORMODYNE ) 200 MG tablet Take 1 tablet (200 mg total) by mouth 2 (two) times daily. 05/13/23  Yes Roslyn Coombe, MD  spironolactone  (ALDACTONE ) 50 MG tablet Take 1 tablet (50 mg total) by mouth 2 (two) times daily. 05/13/23  Yes Roslyn Coombe, MD  traMADol  (ULTRAM ) 50 MG tablet Take 1 tablet (50 mg total) by mouth every 6 (six) hours as needed. Patient taking differently: Take 50 mg by mouth every 6 (six) hours as needed (for pain). 05/13/23  Yes Roslyn Coombe, MD  vitamin B-12 (CYANOCOBALAMIN ) 1000 MCG tablet Take 1 tablet (1,000 mcg total) by mouth daily. 05/28/20  Yes Roslyn Coombe, MD  diazepam  (VALIUM ) 5 MG tablet TAKE 1 TABLET (5 MG TOTAL) BY MOUTH EVERY 12 (TWELVE) HOURS AS NEEDED FOR ANXIETY. Patient not taking: Reported on 07/14/2023 01/26/23   Roslyn Coombe, MD                                                                                                                                     Past Surgical History Past Surgical History:  Procedure Laterality Date   ABDOMINAL HYSTERECTOMY     CATARACT EXTRACTION     COLONOSCOPY W/ POLYPECTOMY     EYE SURGERY     OOPHORECTOMY     Family History Family History  Problem Relation Age of Onset   Stroke Other        F 1st degree relative   Parkinsonism Father    Cancer Other        1st degree relative <50   Coronary artery disease Other        F 1st degree relative <60   Diabetes Other        1st degree relative   Hypertension Other    Diabetes Mother    Cancer Mother     Social History Social History   Tobacco Use   Smoking status: Former   Smokeless tobacco: Never  Advertising account planner   Vaping status: Never Used  Substance Use Topics   Alcohol use: No    Alcohol/week: 0.0 standard drinks of alcohol   Drug use: No   Allergies Patient has no known allergies.  Review of Systems Review of Systems  All other systems reviewed and are negative.   Physical Exam Vital Signs  I have reviewed the triage vital signs BP 134/64   Pulse 91   Temp 98.1 F (36.7 C) (Oral)   Resp (!) 22   Ht 5\' 3"  (1.6 m)   Wt 68 kg   SpO2 98%   BMI 26.57 kg/m  Physical Exam Vitals and nursing note reviewed.  Constitutional:      General: She is not in acute distress.    Appearance: She is well-developed.  HENT:     Head: Normocephalic and atraumatic.     Mouth/Throat:     Mouth: Mucous membranes are dry.  Eyes:     Pupils: Pupils are equal, round, and reactive to light.  Cardiovascular:     Rate and Rhythm: Normal rate and regular rhythm.     Heart sounds: No murmur heard. Pulmonary:     Effort: Pulmonary effort is normal. No respiratory distress.     Breath sounds: Normal breath sounds.  Abdominal:     General: Abdomen is flat.     Palpations: Abdomen is soft.     Tenderness: There is no abdominal tenderness.  Musculoskeletal:  General: No tenderness.     Right lower leg: Edema present.      Left lower leg: Edema present.  Skin:    General: Skin is warm and dry.  Neurological:     Mental Status: She is alert.     Comments: Moves all 4 extremities equally, no cranial nerve deficit.  Oriented to self, knows she is in the hospital, does not know the year.  Rambling speech  Psychiatric:        Mood and Affect: Mood normal.        Behavior: Behavior normal.     ED Results and Treatments Labs (all labs ordered are listed, but only abnormal results are displayed) Labs Reviewed  COMPREHENSIVE METABOLIC PANEL WITH GFR - Abnormal; Notable for the following components:      Result Value   Sodium 134 (*)    CO2 21 (*)    Creatinine, Ser 1.01 (*)    GFR, Estimated 54 (*)    All other components within normal limits  CBC WITH DIFFERENTIAL/PLATELET - Abnormal; Notable for the following components:   RBC 3.12 (*)    Hemoglobin 9.4 (*)    HCT 29.2 (*)    RDW 15.9 (*)    Platelets 69 (*)    All other components within normal limits  URINALYSIS, W/ REFLEX TO CULTURE (INFECTION SUSPECTED) - Abnormal; Notable for the following components:   Leukocytes,Ua MODERATE (*)    Bacteria, UA RARE (*)    All other components within normal limits  I-STAT CHEM 8, ED - Abnormal; Notable for the following components:   TCO2 20 (*)    Hemoglobin 10.2 (*)    HCT 30.0 (*)    All other components within normal limits  URINE CULTURE  TSH  AMMONIA  ETHANOL  I-STAT CG4 LACTIC ACID, ED                                                                                                                          Radiology DG Hip Unilat W or Wo Pelvis 2-3 Views Right Result Date: 07/14/2023 CLINICAL DATA:  Pain EXAM: DG HIP (WITH OR WITHOUT PELVIS) 2-3V RIGHT COMPARISON:  None Available. FINDINGS: Advanced degenerative changes in the hips bilaterally with joint space loss and spurring, left slightly greater than right. SI joints symmetric. No acute bony abnormality. Specifically, no fracture,  subluxation, or dislocation. IMPRESSION: Advanced osteoarthritis in the hips bilaterally. No acute bony abnormality. Electronically Signed   By: Janeece Mechanic M.D.   On: 07/14/2023 18:49   CT Head Wo Contrast Result Date: 07/14/2023 CLINICAL DATA:  Mental status change EXAM: CT HEAD WITHOUT CONTRAST TECHNIQUE: Contiguous axial images were obtained from the base of the skull through the vertex without intravenous contrast. RADIATION DOSE REDUCTION: This exam was performed according to the departmental dose-optimization program which includes automated exposure control, adjustment of the mA and/or kV according to patient size and/or use of iterative reconstruction technique. COMPARISON:  None Available.  FINDINGS: Brain: No acute territorial infarction, hemorrhage or intracranial mass. Small chronic appearing cortical/subcortical infarct at the left frontal vertex. Advanced white matter hypodensity. Moderate atrophy. Prominent ventricles felt secondary to atrophy. Possible small chronic infarcts in the left lateral cerebellum versus prominent atrophy and widened folia. Vascular: No hyperdense vessels.  Carotid vascular calcification Skull: Normal. Negative for fracture or focal lesion. Sinuses/Orbits: Retention cyst in the sphenoid sinuses Other: None IMPRESSION: 1. No CT evidence for acute intracranial abnormality. 2. Atrophy and advanced chronic small vessel ischemic changes of the white matter. Small chronic appearing cortical/subcortical infarct at the left frontal vertex. Electronically Signed   By: Esmeralda Hedge M.D.   On: 07/14/2023 18:01   DG Chest Portable 1 View Result Date: 07/14/2023 CLINICAL DATA:  Chest pain.  Altered mental status. EXAM: PORTABLE CHEST 1 VIEW COMPARISON:  04/27/2022 FINDINGS: Stable mildly enlarged cardiac silhouette. Tortuous and partially calcified thoracic aorta. Interval small amount of patchy density at the right lateral lung base with a possible small right pleural effusion.  The remainder of the lungs are clear with normal vascularity. Diffuse osteopenia. IMPRESSION: 1. Interval small amount of patchy density at the right lateral lung base, which could represent atelectasis or pneumonia. 2. Possible small right pleural effusion. 3. Stable mild cardiomegaly. Electronically Signed   By: Catherin Closs M.D.   On: 07/14/2023 17:41    Pertinent labs & imaging results that were available during my care of the patient were reviewed by me and considered in my medical decision making (see MDM for details).  Medications Ordered in ED Medications  acetaminophen (TYLENOL) tablet 650 mg (has no administration in time range)    Or  acetaminophen (TYLENOL) suppository 650 mg (has no administration in time range)  senna-docusate (Senokot-S) tablet 1 tablet (has no administration in time range)  ondansetron (ZOFRAN) tablet 4 mg (has no administration in time range)    Or  ondansetron (ZOFRAN) injection 4 mg (has no administration in time range)  sodium chloride 0.9 % bolus 1,000 mL (0 mLs Intravenous Stopped 07/14/23 1932)  cefTRIAXone (ROCEPHIN) 1 g in sodium chloride 0.9 % 100 mL IVPB (1 g Intravenous New Bag/Given 07/14/23 1833)                                                                                                                                     Procedures Procedures  (including critical care time)  Medical Decision Making / ED Course   MDM:  89 year old presenting to the emergency department with altered mental status.  Patient in no acute distress, does appear slightly dehydrated on exam.  No other focal abnormality other than bilateral lower extremity edema which is mild.  Lungs clear.  Differential includes toxic or metabolic process, will check labs including CMP, TSH, ammonia.  Differential also includes occult infectious process, will check chest x-ray although lungs clear, will check urinalysis, apparently patient does have history of UTI  with mental  status change.  Also obtain CT head to evaluate for intracranial process but lower concern for this.  Patient does seem confused but currently pleasant, was aggressive with EMS.  Anticipate patient will likely need to be admitted for her mental status change.  Clinical Course as of 07/14/23 1937  Thu Jul 14, 2023  1824 DG Hip Luis Sailors or Wo Pelvis 2-3 Views Right [WS]  [phone] Signed out to Dr. Yvonne Hering, who has admitted the patient. Workup shows UTI. Patient denies any hip pain but family requested xr, had apparently been complaining of this. XR negative. She has full ROM of extremities on exam without pain or tenderness.  [WS]    Clinical Course User Index [WS] Mordecai Applebaum, MD     Additional history obtained: -Additional history obtained from ems -External records from outside source obtained and reviewed including: Chart review including previous notes, labs, imaging, consultation notes including prior notes    Lab Tests: -I ordered, reviewed, and interpreted labs.   The pertinent results include:   Labs Reviewed  COMPREHENSIVE METABOLIC PANEL WITH GFR - Abnormal; Notable for the following components:      Result Value   Sodium 134 (*)    CO2 21 (*)    Creatinine, Ser 1.01 (*)    GFR, Estimated 54 (*)    All other components within normal limits  CBC WITH DIFFERENTIAL/PLATELET - Abnormal; Notable for the following components:   RBC 3.12 (*)    Hemoglobin 9.4 (*)    HCT 29.2 (*)    RDW 15.9 (*)    Platelets 69 (*)    All other components within normal limits  URINALYSIS, W/ REFLEX TO CULTURE (INFECTION SUSPECTED) - Abnormal; Notable for the following components:   Leukocytes,Ua MODERATE (*)    Bacteria, UA RARE (*)    All other components within normal limits  I-STAT CHEM 8, ED - Abnormal; Notable for the following components:   TCO2 20 (*)    Hemoglobin 10.2 (*)    HCT 30.0 (*)    All other components within normal limits  URINE CULTURE  TSH  AMMONIA  ETHANOL   I-STAT CG4 LACTIC ACID, ED    Notable for UTI   EKG   EKG Interpretation Date/Time:  Thursday Jul 14 2023 17:08:26 EDT Ventricular Rate:  85 PR Interval:  159 QRS Duration:  79 QT Interval:  379 QTC Calculation: 451 R Axis:   -15  Text Interpretation: Sinus rhythm Abnormal R-wave progression, early transition Left ventricular hypertrophy Confirmed by Hiawatha Lout ([id number]) on 07/14/2023 5:46:05 PM         Imaging Studies ordered: I ordered imaging studies including CT head  On my interpretation imaging demonstrates no acute process I independently visualized and interpreted imaging. I agree with the radiologist interpretation   Medicines ordered and prescription drug management: Meds ordered this encounter  Medications   sodium chloride 0.9 % bolus 1,000 mL   cefTRIAXone (ROCEPHIN) 1 g in sodium chloride 0.9 % 100 mL IVPB    Antibiotic Indication::   UTI   DISCONTD: enoxaparin (LOVENOX) injection 40 mg   OR Linked Order Group    acetaminophen (TYLENOL) tablet 650 mg    acetaminophen (TYLENOL) suppository 650 mg   senna-docusate (Senokot-S) tablet 1 tablet   OR Linked Order Group    ondansetron (ZOFRAN) tablet 4 mg    ondansetron (ZOFRAN) injection 4 mg    -I have reviewed the patients home medicines and have made adjustments  as needed   Consultations Obtained: I requested consultation with the hospitalist,  and discussed lab and imaging findings as well as pertinent plan - they recommend: admission   Cardiac Monitoring: The patient was maintained on a cardiac monitor.  I personally viewed and interpreted the cardiac monitored which showed an underlying rhythm of: NSR  Social Determinants of Health:  Diagnosis or treatment significantly limited by social determinants of health: lives alone   Reevaluation: After the interventions noted above, I reevaluated the patient and found that their symptoms have improved  Co morbidities that complicate the patient  evaluation  Past Medical History:  Diagnosis Date   Allergic rhinitis    Anxiety    Aortic valve regurgitation 10/12/2010   CKD (chronic kidney disease) stage 3, GFR 30-59 ml/min (HCC) 09/26/2017   COPD (chronic obstructive pulmonary disease) (HCC)    Disc disease, degenerative, cervical    Diverticulosis of colon    DJD (degenerative joint disease)    LS spine   Gallstones    History of colonic polyps    Hyperlipidemia    Hypertension    Osteoporosis       Dispostion: Disposition decision including need for hospitalization was considered, and patient admitted to the hospital.    Final Clinical Impression(s) / ED Diagnoses Final diagnoses:  Acute metabolic encephalopathy  Acute cystitis without hematuria     This chart was dictated using voice recognition software.  Despite best efforts to proofread,  errors can occur which can change the documentation meaning.    Mordecai Applebaum, MD 07/14/23 [phone]

## 2023-07-14 NOTE — ED Triage Notes (Signed)
 Pt to er via ems, per ems pt is from home and is here for a rule out UTI, states that she has become aggressive today and tried to bite ems.

## 2023-07-14 NOTE — H&P (Signed)
 History and Physical  Megan Baldwin ZOX:[id number] DOB: Jan 22, 1934 DOA: 07/14/2023  PCP: Roslyn Coombe, MD   Chief Complaint: Altered mental status, behavior change  HPI: Megan Baldwin is a 89 y.o. female with medical history significant for anxiety, COPD, mild cognitive impairment, vitamin B12 deficiency, osteoarthritis, HLD, HTN, DDD, and osteoporosis who presented to EMS for evaluation of altered mental status. Patient altered so unable to give accurate history. Per daughter, patient's friend came by her house and noticed that patient was on the ground. Patient has history of recurrent falls and has fallen about 3 times in the past 2 months (daughter thinks this is likely due to patient not using her walker appropriately). Patient has been complaining of some left leg pain and over the last year, has had a decline in her cognition. During a recent MMSE by home health RN, patient was unable to recall the 3 items but does not have a formal diagnosis for dementia. At bedside, patient able to tell me her name, DOB and that she is currently in hospital but disoriented to time, and situation and thinks she is in the ER.  She denies any fevers, chills, abdominal pain, cough, SOB, nausea or vomiting.  ED Course: Initial vitals shows patient afebrile slightly hypertensive with SBP in the 130s-140s.  Initial labs showed WBC 5.4, Hgb 9.4, platelets 69, normal kidney function, normal TSH, ammonia, ethanol levels and lactic acid.  UA shows negative nitrite, moderate leuks, WBC 21-50 and rare bacteria. CT head with no acute intracranial abnormalities. Patient received IV NS 1 L bolus and IV Rocephin. TRH was consulted for admission.  Review of Systems: Please see HPI for pertinent positives and negatives. A complete 10 system review of systems are otherwise negative.  Past Medical History:  Diagnosis Date   Allergic rhinitis    Anxiety    Aortic valve regurgitation 10/12/2010   CKD (chronic kidney disease)  stage 3, GFR 30-59 ml/min (HCC) 09/26/2017   COPD (chronic obstructive pulmonary disease) (HCC)    Disc disease, degenerative, cervical    Diverticulosis of colon    DJD (degenerative joint disease)    LS spine   Gallstones    History of colonic polyps    Hyperlipidemia    Hypertension    Osteoporosis    Past Surgical History:  Procedure Laterality Date   ABDOMINAL HYSTERECTOMY     CATARACT EXTRACTION     COLONOSCOPY W/ POLYPECTOMY     EYE SURGERY     OOPHORECTOMY     Social History:  reports that she has quit smoking. She has never used smokeless tobacco. She reports that she does not drink alcohol and does not use drugs.  No Known Allergies  Family History  Problem Relation Age of Onset   Stroke Other        F 1st degree relative   Parkinsonism Father    Cancer Other        1st degree relative <50   Coronary artery disease Other        F 1st degree relative <60   Diabetes Other        1st degree relative   Hypertension Other    Diabetes Mother    Cancer Mother      Prior to Admission medications   Medication Sig Start Date End Date Taking? Authorizing Provider  amLODipine  (NORVASC ) 5 MG tablet Take 1 tablet (5 mg total) by mouth daily. 05/13/23  Yes Roslyn Coombe, MD  labetalol  (NORMODYNE ) 200  MG tablet Take 1 tablet (200 mg total) by mouth 2 (two) times daily. 05/13/23  Yes Roslyn Coombe, MD  spironolactone  (ALDACTONE ) 50 MG tablet Take 1 tablet (50 mg total) by mouth 2 (two) times daily. 05/13/23  Yes Roslyn Coombe, MD  traMADol  (ULTRAM ) 50 MG tablet Take 1 tablet (50 mg total) by mouth every 6 (six) hours as needed. Patient taking differently: Take 50 mg by mouth every 6 (six) hours as needed (for pain). 05/13/23  Yes Roslyn Coombe, MD  vitamin B-12 (CYANOCOBALAMIN ) 1000 MCG tablet Take 1 tablet (1,000 mcg total) by mouth daily. 05/28/20  Yes Roslyn Coombe, MD  diazepam  (VALIUM ) 5 MG tablet TAKE 1 TABLET (5 MG TOTAL) BY MOUTH EVERY 12 (TWELVE) HOURS AS NEEDED FOR  ANXIETY. Patient not taking: Reported on 07/14/2023 01/26/23   Roslyn Coombe, MD    Physical Exam: BP 134/64   Pulse 91   Temp 98.1 F (36.7 C) (Oral)   Resp (!) 22   Ht 5\' 3"  (1.6 m)   Wt 68 kg   SpO2 98%   BMI 26.57 kg/m  General: Chronically ill elderly woman laying in bed. No acute distress. HEENT: [place]/AT. Anicteric sclera. Dry mucous membrane. CV: RRR. No murmurs, rubs, or gallops. 1+ BLE edema uo to mid calf Pulmonary: Lungs CTAB. Normal effort. No wheezing or rales. Abdominal: Soft, nontender, nondistended. Normal bowel sounds. Extremities: Palpable radial and DP pulses. Normal ROM. Skin: Warm and dry. No obvious rash or lesions. Neuro: Alert and oriented to self, person and place but not to time or situation. Moves all extremities. Normal sensation to light touch. No focal deficit. Psych: Normal mood and affect          Labs on Admission:  Basic Metabolic Panel: Recent Labs  Lab 07/14/23 1648 07/14/23 1704  NA 134* 135  K 4.0 4.1  CL 103 105  CO2 21*  --   GLUCOSE 83 78  BUN 21 20  CREATININE 1.01* 1.00  CALCIUM  9.2  --    Liver Function Tests: Recent Labs  Lab 07/14/23 1648  AST 28  ALT 17  ALKPHOS 65  BILITOT 1.0  PROT 7.5  ALBUMIN 3.6   No results for input(s): "LIPASE", "AMYLASE" in the last 168 hours. Recent Labs  Lab 07/14/23 1648  AMMONIA <13   CBC: Recent Labs  Lab 07/14/23 1648 07/14/23 1704  WBC 5.4  --   NEUTROABS 3.4  --   HGB 9.4* 10.2*  HCT 29.2* 30.0*  MCV 93.6  --   PLT 69*  --    Cardiac Enzymes: No results for input(s): "CKTOTAL", "CKMB", "CKMBINDEX", "TROPONINI" in the last 168 hours. BNP (last 3 results) No results for input(s): "BNP" in the last 8760 hours.  ProBNP (last 3 results) No results for input(s): "PROBNP" in the last 8760 hours.  CBG: No results for input(s): "GLUCAP" in the last 168 hours.  Radiological Exams on Admission: DG Hip Unilat W or Wo Pelvis 2-3 Views Right Result Date:  07/14/2023 CLINICAL DATA:  Pain EXAM: DG HIP (WITH OR WITHOUT PELVIS) 2-3V RIGHT COMPARISON:  None Available. FINDINGS: Advanced degenerative changes in the hips bilaterally with joint space loss and spurring, left slightly greater than right. SI joints symmetric. No acute bony abnormality. Specifically, no fracture, subluxation, or dislocation. IMPRESSION: Advanced osteoarthritis in the hips bilaterally. No acute bony abnormality. Electronically Signed   By: Janeece Mechanic M.D.   On: 07/14/2023 18:49   CT Head Wo  Contrast Result Date: 07/14/2023 CLINICAL DATA:  Mental status change EXAM: CT HEAD WITHOUT CONTRAST TECHNIQUE: Contiguous axial images were obtained from the base of the skull through the vertex without intravenous contrast. RADIATION DOSE REDUCTION: This exam was performed according to the departmental dose-optimization program which includes automated exposure control, adjustment of the mA and/or kV according to patient size and/or use of iterative reconstruction technique. COMPARISON:  None Available. FINDINGS: Brain: No acute territorial infarction, hemorrhage or intracranial mass. Small chronic appearing cortical/subcortical infarct at the left frontal vertex. Advanced white matter hypodensity. Moderate atrophy. Prominent ventricles felt secondary to atrophy. Possible small chronic infarcts in the left lateral cerebellum versus prominent atrophy and widened folia. Vascular: No hyperdense vessels.  Carotid vascular calcification Skull: Normal. Negative for fracture or focal lesion. Sinuses/Orbits: Retention cyst in the sphenoid sinuses Other: None IMPRESSION: 1. No CT evidence for acute intracranial abnormality. 2. Atrophy and advanced chronic small vessel ischemic changes of the white matter. Small chronic appearing cortical/subcortical infarct at the left frontal vertex. Electronically Signed   By: Esmeralda Hedge M.D.   On: 07/14/2023 18:01   DG Chest Portable 1 View Result Date:  07/14/2023 CLINICAL DATA:  Chest pain.  Altered mental status. EXAM: PORTABLE CHEST 1 VIEW COMPARISON:  04/27/2022 FINDINGS: Stable mildly enlarged cardiac silhouette. Tortuous and partially calcified thoracic aorta. Interval small amount of patchy density at the right lateral lung base with a possible small right pleural effusion. The remainder of the lungs are clear with normal vascularity. Diffuse osteopenia. IMPRESSION: 1. Interval small amount of patchy density at the right lateral lung base, which could represent atelectasis or pneumonia. 2. Possible small right pleural effusion. 3. Stable mild cardiomegaly. Electronically Signed   By: Catherin Closs M.D.   On: 07/14/2023 17:41   Assessment/Plan Megan Baldwin is a 89 y.o. female with medical history significant for anxiety, COPD, mild cognitive impairment, vitamin B12 deficiency, osteoarthritis, HLD, HTN, DDD, and osteoporosis who presented to EMS for evaluation of altered mental status and admitted for UTI  # Acute metabolic encephalopathy - In the setting of UTI with underlying mild cognitive impairment and memory loss - CT head unremarkable, no electrolyte or metabolic abnormalities - On chart review, patient has had gradual decline in cognition over the last year per PCP - Delirium precautions - Will need outpatient neurocognitive eval to formally diagnose dementia  # Acute cystitis - Presented with altered mental status and found to have evidence of infection on UA - She is afebrile, with no leukocytosis - Continue IV Rocephin - Follow-up urine culture  # Normocytic anemia - Hgb of 9.4 from 11.7 2 months ago - No evidence of active bleed or bruising. - Has severe vitamin B12 deficiency, check iron panel, ferritin and folate  # HTN - BP stable with SBP in the 130s to 140s - Patient tried amlodipine , spironolactone  and labetalol , PCP suspects medication noncompliance - Resume low-dose amlodipine  for now, can add other BP meds as  needed  # Vitamin B12 deficiency - Vitamin B12 level significantly low at 73 2 months ago - Started on oral vitamin B12 1000 mcg daily by PCP - This is likely contributing to patient's cognitive deficits and short term memory loss - Start IM vitamin B12 1000 mcg daily injections x3 doses while inpatient - Resume oral supplementation at discharge  # Recurrent falls # Bilateral hip osteoarthritis - Per daughter, patient has history of falls and has fallen about 3 times in the past 2 months - X-ray of  the hips shows advanced osteoarthritis bilaterally but no acute bony abnormality - Normal vitamin D  levels at 50.48 2 months ago - Pain control with scheduled Tylenol and Voltaren  gel - PT/OT eval and treat - Fall precautions  # BLE edema - Has 1+ BLE edema due to be due to venous insufficiency by her PCP - Apply TED hose  # COPD - Stable -As needed DuoNebs  # Anxiety - Stable  DVT prophylaxis: SCDs    Code Status: Full Code  Consults called: None  Family Communication: Discussed admission with daughter over the phone  Severity of Illness: The appropriate patient status for this patient is INPATIENT. Inpatient status is judged to be reasonable and necessary in order to provide the required intensity of service to ensure the patient's safety. The patient's presenting symptoms, physical exam findings, and initial radiographic and laboratory data in the context of their chronic comorbidities is felt to place them at high risk for further clinical deterioration. Furthermore, it is not anticipated that the patient will be medically stable for discharge from the hospital within 2 midnights of admission.   * I certify that at the point of admission it is my clinical judgment that the patient will require inpatient hospital care spanning beyond 2 midnights from the point of admission due to high intensity of service, high risk for further deterioration and high frequency of surveillance  required.*  Level of care: Med-Surg   This record has been created using Conservation officer, historic buildings. Errors have been sought and corrected, but may not always be located. Such creation errors do not reflect on the standard of care.   Vita Grip, MD 07/14/2023, 7:23 PM [facility] Pager: [phone] Isaiah 41:10   If 7PM-7AM, please contact night-coverage [email or web address] Password [email or web address]

## 2023-07-14 NOTE — ED Notes (Signed)
 This RN called the floor to make receiving RN aware that Pt would be up shortly. Receiving RN made this RN aware that she just finished getting report and needed about 15-20 mins before receiving Pt.

## 2023-07-14 NOTE — Plan of Care (Signed)
  Problem: Clinical Measurements: °Goal: Ability to maintain clinical measurements within normal limits will improve °Outcome: Progressing °  °Problem: Education: °Goal: Knowledge of General Education information will improve °Description: Including pain rating scale, medication(s)/side effects and non-pharmacologic comfort measures °Outcome: Not Progressing °  °Problem: Health Behavior/Discharge Planning: °Goal: Ability to manage health-related needs will improve °Outcome: Not Progressing °  °

## 2023-07-14 NOTE — ED Notes (Signed)
 Spoke with daughter and doctor was going to call daughter about extra xray provider aware.

## 2023-07-15 DIAGNOSIS — G9341 Metabolic encephalopathy: Secondary | ICD-10-CM | POA: Diagnosis not present

## 2023-07-15 LAB — CBC
HCT: 28.5 % — ABNORMAL LOW (ref 36.0–46.0)
Hemoglobin: 9.2 g/dL — ABNORMAL LOW (ref 12.0–15.0)
MCH: 30.2 pg (ref 26.0–34.0)
MCHC: 32.3 g/dL (ref 30.0–36.0)
MCV: 93.4 fL (ref 80.0–100.0)
Platelets: 71 10*3/uL — ABNORMAL LOW (ref 150–400)
RBC: 3.05 MIL/uL — ABNORMAL LOW (ref 3.87–5.11)
RDW: 15.9 % — ABNORMAL HIGH (ref 11.5–15.5)
WBC: 4.2 10*3/uL (ref 4.0–10.5)
nRBC: 0 % (ref 0.0–0.2)

## 2023-07-15 LAB — BASIC METABOLIC PANEL WITH GFR
Anion gap: 10 (ref 5–15)
BUN: 17 mg/dL (ref 8–23)
CO2: 19 mmol/L — ABNORMAL LOW (ref 22–32)
Calcium: 8.4 mg/dL — ABNORMAL LOW (ref 8.9–10.3)
Chloride: 106 mmol/L (ref 98–111)
Creatinine, Ser: 0.88 mg/dL (ref 0.44–1.00)
GFR, Estimated: >60 mL/min (ref >60)
Glucose, Bld: 72 mg/dL (ref 70–99)
Potassium: 3.7 mmol/L (ref 3.5–5.1)
Sodium: 135 mmol/L (ref 135–145)

## 2023-07-15 LAB — URINE CULTURE: Culture: NO GROWTH

## 2023-07-15 LAB — IRON AND TIBC
Iron: 36 ug/dL (ref 28–170)
Saturation Ratios: 13 % (ref 10.4–31.8)
TIBC: 269 ug/dL (ref 250–450)
UIBC: 233 ug/dL

## 2023-07-15 LAB — FERRITIN: Ferritin: 166 ng/mL (ref 11–307)

## 2023-07-15 LAB — FOLATE: Folate: 6 ng/mL (ref >5.9)

## 2023-07-15 MED ORDER — MORPHINE SULFATE (PF) 2 MG/ML IV SOLN
1.0000 mg | Freq: Once | INTRAVENOUS | Status: AC
  Administered 2023-07-15: 1 mg via INTRAVENOUS
  Filled 2023-07-15: qty 1

## 2023-07-15 MED ORDER — ENSURE ENLIVE PO LIQD
237.0000 mL | Freq: Two times a day (BID) | ORAL | Status: DC
  Administered 2023-07-15 – 2023-07-21 (×5): 237 mL via ORAL

## 2023-07-15 NOTE — Plan of Care (Incomplete)
  Problem: Education: Goal: Knowledge of General Education information will improve Description: Including pain rating scale, medication(s)/side effects and non-pharmacologic comfort measures Outcome: Progressing   Problem: Health Behavior/Discharge Planning: Goal: Ability to manage health-related needs will improve Outcome: Progressing   Problem: Clinical Measurements: Goal: Will remain free from infection Outcome: Progressing Goal: Diagnostic test results will improve Outcome: Progressing   Problem: Activity: Goal: Risk for activity intolerance will decrease Outcome: Progressing   Problem: Coping: Goal: Level of anxiety will decrease Outcome: Progressing   Problem: Elimination: Goal: Will not experience complications related to bowel motility Outcome: Progressing Goal: Will not experience complications related to urinary retention Outcome: Progressing   Problem: Pain Managment: Goal: General experience of comfort will improve and/or be controlled Outcome: Progressing   Problem: Safety: Goal: Ability to remain free from injury will improve Outcome: Progressing   Problem: Clinical Measurements: Goal: Respiratory complications will improve Outcome: Adequate for Discharge Goal: Cardiovascular complication will be avoided Outcome: Adequate for Discharge   Problem: Nutrition: Goal: Adequate nutrition will be maintained Outcome: Adequate for Discharge   Problem: Skin Integrity: Goal: Risk for impaired skin integrity will decrease Outcome: Adequate for Discharge

## 2023-07-15 NOTE — Evaluation (Signed)
 Physical Therapy Evaluation Patient Details Name: Megan Baldwin MRN: [id number] DOB: Jul 06, 1933 Today's Date: 07/15/2023  History of Present Illness  Pt presented with AMS with recent fall at home alone. PMH: anxiety, COPD, mild cognitive impairment, vitamin B12 deficiency, osteoarthritis, HLD, HTN, DDD, and osteoporosis.  Clinical Impression  Pt presents with dependencies in mobility secondary to the above diagnosis. Pt reports she lives alone and walks with a RW and goes to the grocery store. Pt was oriented x3, however stated she forgets things. Pt did demonstrate decreased memory, problem solving and following multi-step commands. Pt did c/o left knee pain with movement. Strength limited to 2/5 L LE. Pt min assist to get back into the bed and mod assist for step pivot transfers from [facility] to bed. Pt will continue to benefit from acute skilled PT to maximize mobility and independence for next venue of care. Recommend SNF placement unless family can arrange 24 hour supervision due to mobility and cognition.       If plan is discharge home, recommend the following: Supervision due to cognitive status;Assist for transportation;Assistance with cooking/housework;A lot of help with walking and/or transfers;Assistance with feeding;Direct supervision/assist for financial management;Help with stairs or ramp for entrance   Can travel by private vehicle   No    Equipment Recommendations None recommended by PT  Recommendations for Other Services       Functional Status Assessment Patient has had a recent decline in their functional status and demonstrates the ability to make significant improvements in function in a reasonable and predictable amount of time.     Precautions / Restrictions Precautions Precautions: Fall Restrictions Weight Bearing Restrictions Per Provider Order: No      Mobility  Bed Mobility Overal bed mobility: Needs Assistance Bed Mobility: Sit to Supine       Sit to  supine: Min assist, HOB elevated, Used rails     Patient Response: Cooperative  Transfers Overall transfer level: Needs assistance Equipment used: Rolling walker (2 wheels) Transfers: Bed to chair/wheelchair/BSC, Sit to/from Stand Sit to Stand: Mod assist   Step pivot transfers: Mod assist       General transfer comment: cues for hand placement, wide BOS and forward flexed trunk with standing, difficulty stepping, max cues for sequencing    Ambulation/Gait                  Stairs            Wheelchair Mobility     Tilt Bed Tilt Bed Patient Response: Cooperative  Modified Rankin (Stroke Patients Only)       Balance Overall balance assessment: Needs assistance Sitting-balance support: Bilateral upper extremity supported, Feet supported Sitting balance-Leahy Scale: Good     Standing balance support: Bilateral upper extremity supported, Reliant on assistive device for balance, During functional activity Standing balance-Leahy Scale: Poor                               Pertinent Vitals/Pain Pain Assessment Pain Assessment: Faces Faces Pain Scale: Hurts little more Pain Location: left knee with movement Pain Descriptors / Indicators: Discomfort Pain Intervention(s): Limited activity within patient's tolerance, Monitored during session, Repositioned    Home Living Family/patient expects to be discharged to:: Private residence Living Arrangements: Alone Available Help at Discharge: Family Type of Home: House Home Access: Stairs to enter Entrance Stairs-Rails: Right Entrance Stairs-Number of Steps: unsure   Home Layout: One level Home Equipment: Agricultural consultant (  2 wheels)      Prior Function Prior Level of Function : Independent/Modified Independent             Mobility Comments: Pt with decreased memory, unsure on PLF or home environmet. Pt reported she lives alone and was going grocery shopping and walking with her RW. Pt stated  her friends and daughter checks in on her.       Extremity/Trunk Assessment   Upper Extremity Assessment Upper Extremity Assessment: Defer to OT evaluation    Lower Extremity Assessment Lower Extremity Assessment: LLE deficits/detail;RLE deficits/detail RLE Deficits / Details: strength grossly 3/5 RLE Sensation: WNL LLE Deficits / Details: knee 2/5 with increased pain, hip limited secondary to knee pain LLE: Unable to fully assess due to pain LLE Sensation: WNL    Cervical / Trunk Assessment Cervical / Trunk Assessment: Kyphotic  Communication   Communication Communication: No apparent difficulties    Cognition Arousal: Alert Behavior During Therapy: WFL for tasks assessed/performed   PT - Cognitive impairments: Memory, No family/caregiver present to determine baseline, Problem solving, Sequencing, Awareness                         Following commands: Impaired Following commands impaired: Follows one step commands with increased time     Cueing Cueing Techniques: Verbal cues, Tactile cues     General Comments General comments (skin integrity, edema, etc.): assisted nurse tech with self care with dynamic standing balance from [facility] for self-care.    Exercises     Assessment/Plan    PT Assessment Patient needs continued PT services  PT Problem List Decreased strength;Decreased balance;Decreased cognition;Pain;Decreased range of motion;Decreased mobility;Decreased activity tolerance;Decreased safety awareness       PT Treatment Interventions DME instruction;Functional mobility training;Patient/family education;Gait training;Therapeutic activities;Stair training;Therapeutic exercise    PT Goals (Current goals can be found in the Care Plan section)  Acute Rehab PT Goals PT Goal Formulation: Patient unable to participate in goal setting Time For Goal Achievement: 07/29/23 Potential to Achieve Goals: Good    Frequency Min 2X/week     Co-evaluation                AM-PAC PT "6 Clicks" Mobility  Outcome Measure Help needed turning from your back to your side while in a flat bed without using bedrails?: A Little Help needed moving from lying on your back to sitting on the side of a flat bed without using bedrails?: A Little Help needed moving to and from a bed to a chair (including a wheelchair)?: A Lot Help needed standing up from a chair using your arms (e.g., wheelchair or bedside chair)?: A Lot Help needed to walk in hospital room?: A Lot Help needed climbing 3-5 steps with a railing? : Total 6 Click Score: 13    End of Session Equipment Utilized During Treatment: Gait belt Activity Tolerance: Patient tolerated treatment well Patient left: in bed;with call bell/phone within reach;with bed alarm set Nurse Communication: Mobility status PT Visit Diagnosis: History of falling (Z91.81);Pain;Muscle weakness (generalized) (M62.81);Difficulty in walking, not elsewhere classified (R26.2);Unsteadiness on feet (R26.81) Pain - Right/Left: Left Pain - part of body: Knee    Time: 1610-9604 PT Time Calculation (min) (ACUTE ONLY): 23 min   Charges:   PT Evaluation $PT Eval Moderate Complexity: 1 Mod PT Treatments $Therapeutic Activity: 8-22 mins PT General Charges $$ ACUTE PT VISIT: 1 Visit         Mayes Sangiovanni Kerstine 07/15/2023, 11:14 AM

## 2023-07-15 NOTE — Progress Notes (Signed)
 PROGRESS NOTE  Megan Baldwin  ZOX:[id number] DOB: 10-Mar-1933 DOA: 07/14/2023 PCP: Roslyn Coombe, MD  Consultants  Brief Narrative: 89 y.o. female with medical history significant for anxiety, COPD, mild cognitive impairment, vitamin B12 deficiency, osteoarthritis, HLD, HTN, DDD, and osteoporosis who presented to EMS for evaluation of altered mental status.  Per daughter, patient's friend came by her house and noticed that patient was on the ground. Patient has history of recurrent falls and has fallen about 3 times in the past 2 months (daughter thinks this is likely due to patient not using her walker appropriately). Patient has been complaining of some left leg pain and over the last year, has had a decline in her cognition. Brought to ED for same, found to have UTI.  Admitted for the same.    Assessment & Plan: Acute metabolic encephalopathy - In the setting of UTI with underlying mild cognitive impairment and memory loss - CT head unremarkable, no electrolyte or metabolic abnormalities - On chart review, patient has had gradual decline in cognition over the last year per PCP -- see B12 below - Delirium precautions - Will need outpatient neurocognitive eval to formally diagnose dementia   # Acute cystitis - Presented with altered mental status and found to have evidence of infection on UA - She is afebrile, with no leukocytosis - Continue IV Rocephin - culture no growth thus far   # Normocytic anemia - Hgb of 9.4 from 11.7 2 months ago - No evidence of active bleed or bruising. - Has severe vitamin B12 deficiency, check iron panel, ferritin and folate   # HTN - BP stable with SBP in the 130s to 140s - Patient tried amlodipine , spironolactone  and labetalol , PCP suspects medication noncompliance - Resume low-dose amlodipine  for now, can add other BP meds as needed   # Vitamin B12 deficiency - Vitamin B12 level significantly low at 73 2 months ago - This is likely contributing to  patient's cognitive deficits and short term memory loss - Start IM vitamin B12 1000 mcg daily injections x3 doses while inpatient - Resume oral supplementation at discharge   # Recurrent falls # Bilateral hip osteoarthritis - Per daughter, patient has history of falls and has fallen about 3 times in the past 2 months - X-ray of the hips shows advanced osteoarthritis bilaterally but no acute bony abnormality - Pain control with scheduled Tylenol and Voltaren  gel - PT/OT eval and treat - Fall precautions   # BLE edema - Has 1+ BLE edema due to be due to venous insufficiency by her PCP - TED hose in place    # COPD - Stable -As needed DuoNebs   # Anxiety - Stable      DVT prophylaxis:  Place TED hose Start: 07/14/23 2037 Place and maintain sequential compression device Start: 07/14/23 1911  Code Status:   Code Status: Full Code Family Communication: called daughter Level of care: Med-Surg Status is: Inpatient Dispo:  pending resolution of UTI   Consults called: none   Subjective: Patient pleasantly confused.  No complaints.  Knows she's in the hospital, but not which.    Objective: Vitals:   07/15/23 0000 07/15/23 0404 07/15/23 0857 07/15/23 1216  BP: (!) 130/59 122/62 135/70 127/74  Pulse: 83 77 81 82  Resp: 15 16 18 19   Temp: 98.2 F (36.8 C) 98.2 F (36.8 C) 98.1 F (36.7 C) 98.9 F (37.2 C)  TempSrc:   Oral Oral  SpO2: 94% 96% 100% 99%  Weight:  Height:        Intake/Output Summary (Last 24 hours) at 07/15/2023 1608 Last data filed at 07/15/2023 1100 Gross per 24 hour  Intake 1700 ml  Output 1100 ml  Net 600 ml   Filed Weights   07/14/23 1614  Weight: 68 kg   Body mass index is 26.57 kg/m.  Gen: 89 y.o. female in no apparent distress.  Nontoxic Pulm: Non-labored breathing.  Clear to auscultation bilaterally.  CV: Regular rate and rhythm. No murmur, rub, or gallop. No JVD GI: Abdomen soft, non-tender, non-distended, with normoactive bowel  sounds. No organomegaly or masses felt. Ext: Warm, no deformities, no pedal edema Skin: No rashes, lesions  Neuro: Alert but oriented only to person and being in hospital.  Believes it's 1930s. No focal neurological deficits. Psych: Calm  Judgement and insight appear normal. Mood & affect appropriate.     I have personally reviewed the following labs and images: CBC: Recent Labs  Lab 07/14/23 1648 07/14/23 1704 07/15/23 0353  WBC 5.4  --  4.2  NEUTROABS 3.4  --   --   HGB 9.4* 10.2* 9.2*  HCT 29.2* 30.0* 28.5*  MCV 93.6  --  93.4  PLT 69*  --  71*   BMP &GFR Recent Labs  Lab 07/14/23 1648 07/14/23 1704 07/15/23 0353  NA 134* 135 135  K 4.0 4.1 3.7  CL 103 105 106  CO2 21*  --  19*  GLUCOSE 83 78 72  BUN 21 20 17   CREATININE 1.01* 1.00 0.88  CALCIUM  9.2  --  8.4*   Estimated Creatinine Clearance: 40.9 mL/min (by C-G formula based on SCr of 0.88 mg/dL). Liver & Pancreas: Recent Labs  Lab 07/14/23 1648  AST 28  ALT 17  ALKPHOS 65  BILITOT 1.0  PROT 7.5  ALBUMIN 3.6   No results for input(s): "LIPASE", "AMYLASE" in the last 168 hours. Recent Labs  Lab 07/14/23 1648  AMMONIA <13   Diabetic: No results for input(s): "HGBA1C" in the last 72 hours. No results for input(s): "GLUCAP" in the last 168 hours. Cardiac Enzymes: No results for input(s): "CKTOTAL", "CKMB", "CKMBINDEX", "TROPONINI" in the last 168 hours. No results for input(s): "PROBNP" in the last 8760 hours. Coagulation Profile: No results for input(s): "INR", "PROTIME" in the last 168 hours. Thyroid  Function Tests: Recent Labs    07/14/23 1648  TSH 0.901   Lipid Profile: No results for input(s): "CHOL", "HDL", "LDLCALC", "TRIG", "CHOLHDL", "LDLDIRECT" in the last 72 hours. Anemia Panel: Recent Labs    07/15/23 0353  FOLATE 6.0  FERRITIN 166  TIBC 269  IRON 36   Urine analysis:    Component Value Date/Time   COLORURINE YELLOW 07/14/2023 1642   APPEARANCEUR CLEAR 07/14/2023 1642    LABSPEC 1.014 07/14/2023 1642   PHURINE 6.0 07/14/2023 1642   GLUCOSEU NEGATIVE 07/14/2023 1642   GLUCOSEU NEGATIVE 05/13/2023 1439   HGBUR NEGATIVE 07/14/2023 1642   BILIRUBINUR NEGATIVE 07/14/2023 1642   KETONESUR NEGATIVE 07/14/2023 1642   PROTEINUR NEGATIVE 07/14/2023 1642   UROBILINOGEN 1.0 05/13/2023 1439   NITRITE NEGATIVE 07/14/2023 1642   LEUKOCYTESUR MODERATE (A) 07/14/2023 1642   Sepsis Labs: Invalid input(s): "PROCALCITONIN", "LACTICIDVEN"  Microbiology: Recent Results (from the past 240 hours)  Urine Culture     Status: None   Collection Time: 07/14/23  4:42 PM   Specimen: Urine, Random  Result Value Ref Range Status   Specimen Description   Final    URINE, RANDOM Performed at [facility], [address], [place], [place] [address]    Special Requests   Final    NONE Reflexed from [id number] Performed at [facility], [address] [place], [place] [address]    Culture   Final    NO GROWTH Performed at [facility], [address], [place], [place] [address]    Report Status 07/15/2023 FINAL  Final    Radiology Studies: DG Hip Unilat W or Wo Pelvis 2-3 Views Right Result Date: 07/14/2023 CLINICAL DATA:  Pain EXAM: DG HIP (WITH OR WITHOUT PELVIS) 2-3V RIGHT COMPARISON:  None Available. FINDINGS: Advanced degenerative changes in the hips bilaterally with joint space loss and spurring, left slightly greater than right. SI joints symmetric. No acute bony abnormality. Specifically, no fracture, subluxation, or dislocation. IMPRESSION: Advanced osteoarthritis in the hips bilaterally. No acute bony abnormality. Electronically Signed   By: Janeece Mechanic M.D.   On: 07/14/2023 18:49   CT Head Wo Contrast Result Date: 07/14/2023 CLINICAL DATA:  Mental status change EXAM: CT HEAD WITHOUT CONTRAST TECHNIQUE: Contiguous axial images were obtained from the base of the skull through the vertex without intravenous contrast. RADIATION DOSE  REDUCTION: This exam was performed according to the departmental dose-optimization program which includes automated exposure control, adjustment of the mA and/or kV according to patient size and/or use of iterative reconstruction technique. COMPARISON:  None Available. FINDINGS: Brain: No acute territorial infarction, hemorrhage or intracranial mass. Small chronic appearing cortical/subcortical infarct at the left frontal vertex. Advanced white matter hypodensity. Moderate atrophy. Prominent ventricles felt secondary to atrophy. Possible small chronic infarcts in the left lateral cerebellum versus prominent atrophy and widened folia. Vascular: No hyperdense vessels.  Carotid vascular calcification Skull: Normal. Negative for fracture or focal lesion. Sinuses/Orbits: Retention cyst in the sphenoid sinuses Other: None IMPRESSION: 1. No CT evidence for acute intracranial abnormality. 2. Atrophy and advanced chronic small vessel ischemic changes of the white matter. Small chronic appearing cortical/subcortical infarct at the left frontal vertex. Electronically Signed   By: Esmeralda Hedge M.D.   On: 07/14/2023 18:01   DG Chest Portable 1 View Result Date: 07/14/2023 CLINICAL DATA:  Chest pain.  Altered mental status. EXAM: PORTABLE CHEST 1 VIEW COMPARISON:  04/27/2022 FINDINGS: Stable mildly enlarged cardiac silhouette. Tortuous and partially calcified thoracic aorta. Interval small amount of patchy density at the right lateral lung base with a possible small right pleural effusion. The remainder of the lungs are clear with normal vascularity. Diffuse osteopenia. IMPRESSION: 1. Interval small amount of patchy density at the right lateral lung base, which could represent atelectasis or pneumonia. 2. Possible small right pleural effusion. 3. Stable mild cardiomegaly. Electronically Signed   By: Catherin Closs M.D.   On: 07/14/2023 17:41    Scheduled Meds:  acetaminophen  1,000 mg Oral TID   amLODipine   5 mg Oral Daily    cyanocobalamin   1,000 mcg Intramuscular Daily   diclofenac  Sodium  2 g Topical QID   feeding supplement  237 mL Oral BID BM   Continuous Infusions:  cefTRIAXone (ROCEPHIN)  IV 1 g (07/15/23 1603)     LOS: 1 day   35 minutes with more than 50% spent in reviewing records, counseling patient/family and coordinating care.  Trenton Frock, MD [facility] [email or web address] 07/15/2023, 4:08 PM

## 2023-07-15 NOTE — TOC Initial Note (Addendum)
 Transition of Care [facility]) - Initial/Assessment Note    Patient Details  Name: Megan Baldwin MRN: [id number] Date of Birth: Oct 19, 1933  [facility] CM/SW Contact:    Gertha Ku, LCSW Phone Number: 07/15/2023, 11:13 AM  Clinical Narrative:                 CSW received a message from the [facility] hospice liaison. Pt is currently active with [facility] home health services. According to the hospice liaison, the pt's daughter made a referral to [facility] to review the pt for home hospice services. The liaison will be meeting with the pt and the pt's daughter tomorrow.   Attempted to call pt's daughter , she reports she is at work and will call this CSW back when she goes on break . TOC to follow    Adden 2:00pm  CSW spoke with the pt's daughter, [name]. She reported that the pt lives alone. Evette stated that the pt's friend visits on Wednesdays and Thursdays and saturdays for approximately three hours to ensure the pt is eating and doing well.  Evette also mentioned that she resides in [place]  CSW informed the daughter about a call from the [facility] hospice liaison, who would like to meet with her and the pt. Evette stated that she does not want to make any decisions until she is able to speak with the physician regarding the pt's medical workup.  CSW discussed recommendations for SNF  placement and explained that if the daughter chooses SNF placement, the pt would not be eligible for hospice services concurrently. CSW also explained the SNF placement process, including the need for insurance authorization.  Evette agreed to have the pt's information faxed out for SNF placement and will make a decision after speaking with the physician. She stated that she plans to come to town tomorrow and will be at the hospital by 11:00 AM.  TOC to follow.   Expected Discharge Plan:  (TBD) Barriers to Discharge: Continued Medical Work up   Patient Goals and  CMS Choice            Expected Discharge Plan and Services                                              Prior Living Arrangements/Services                       Activities of Daily Living   ADL Screening (condition at time of admission) Independently performs ADLs?: No Does the patient have a NEW difficulty with bathing/dressing/toileting/self-feeding that is expected to last >3 days?: No Does the patient have a NEW difficulty with getting in/out of bed, walking, or climbing stairs that is expected to last >3 days?: No Does the patient have a NEW difficulty with communication that is expected to last >3 days?: No Is the patient deaf or have difficulty hearing?: No Does the patient have difficulty seeing, even when wearing glasses/contacts?: No Does the patient have difficulty concentrating, remembering, or making decisions?: Yes  Permission Sought/Granted                  Emotional Assessment              Admission diagnosis:  Acute cystitis without hematuria [N30.00] Acute metabolic encephalopathy [G93.41] Patient Active Problem  List   Diagnosis Date Noted   Acute metabolic encephalopathy 07/14/2023   Acute cystitis without hematuria 07/14/2023   Normocytic anemia 07/14/2023   Urinary frequency 06/22/2023   Bilateral hip joint arthritis 05/14/2023   Fall 05/14/2023   Gait disorder 05/14/2023   Vitamin D  deficiency 05/14/2023   Edema 05/14/2023   Memory changes 04/27/2022   Recurrent falls 04/27/2022   Rib pain on left side 04/27/2022   Primary osteoarthritis of left hip 04/22/2021   Weight loss 06/03/2020   B12 deficiency 06/03/2020   Depression 01/21/2019   Vertigo 04/19/2018   Hyperglycemia 09/26/2017   CKD (chronic kidney disease) stage 3, GFR 30-59 ml/min (HCC) 09/26/2017   Dizziness 05/03/2014   Bilateral hearing loss 12/19/2013   COPD exacerbation (HCC) 04/10/2013   Pain, joint, shoulder region, left 12/21/2012   Aortic  valve regurgitation 10/12/2010   Encounter for well adult exam with abnormal findings 10/12/2010   COPD (chronic obstructive pulmonary disease) (HCC) 05/01/2009   SHOULDER PAIN, RIGHT 04/18/2009   NECK PAIN, RIGHT 04/18/2009   PARESTHESIA 08/09/2008   LUNG NODULE 08/02/2007   Hyperlipidemia 12/30/2006   Anxiety state 12/30/2006   Essential hypertension 12/30/2006   Allergic rhinitis 12/30/2006   Diverticulosis of colon 12/30/2006   Osteopenia 12/30/2006   History of colonic polyps 12/30/2006   Calculus of gallbladder 12/29/2006   OSTEOARTHRITIS, LUMBOSACRAL SPINE 12/29/2006   DEGENERATIVE DISC DISEASE, CERVICAL SPINE 12/29/2006   COMPUTERIZED TOMOGRAPHY, CHEST, ABNORMAL 12/29/2006   PCP:  Roslyn Coombe, MD Pharmacy:   [facility] [phone] - [place], [place] - [address] AT [facility] [address] [place] [address] Phone: [phone] Fax: [phone]     Social Drivers of Health (SDOH) Social History: SDOH Screenings   Food Insecurity: Patient Unable To Answer (07/14/2023)  Housing: Unknown (07/08/2023)  Transportation Needs: No Transportation Needs (07/08/2023)  Utilities: Not At Risk (07/08/2023)  Depression (PHQ2-9): Low Risk  (07/08/2023)  Tobacco Use: Medium Risk (07/14/2023)   SDOH Interventions:     Readmission Risk Interventions     No data to display

## 2023-07-15 NOTE — Progress Notes (Addendum)
    Patient Name: Megan Baldwin           DOB: 08-Jan-1934  MRN: [id number]      Admission Date: 07/14/2023  Attending Provider: Trenton Frock, MD  Primary Diagnosis: Acute metabolic encephalopathy   Level of care: Med-Surg    CROSS COVER NOTE   Date of Service   07/16/2023   Megan Baldwin, 89 y.o. female, was admitted on 07/14/2023 for Acute metabolic encephalopathy.      HPI/Events of Note   Notified by RN of pt c/o 7/10 "pressure- like chest discomfort."    At bedside, pt is awake and resting in bed. Oriented to self and place.  She appears comfortable and in no acute distress. Hemodynamically stable.   Breath sounds are clear and equal. Non- labored. No accessory muscle use.  S1S2 heard. Regular rate and rhythm. +1 BLE edema. EKG- NSR without ST seg changes. Similar to prior EKG.   Chest Pain PMH: hypertension, hyperlipidemia Patient reports 7/10 chest discomfort described as "pressure- like." Pain is midsternal.  Nonradiating. Not reproducible on palpation. No chest wall tenderness. Does not worsen with deep inhalation or cough.  Denies- dizziness, palpitations, SOB, heartburn, nausea, vomiting, or abd discomfort.  PCP documentation shows prior hx of CP. Nuclear medicine myocardial perfusion scan form 2013- Negative for ischemia or infarction.    Addedndum-  No further CP complaints after morphine . Troponin 232, repeating now.    Possible NSTEMI? Start statin, check lipid panel. Recheck plt level given thrombocytopenia, hold ASA.   Consult cardio if repeat troponin remains elevated.  Adding additional work up- Chest xray, d-dimer, BNP   0330- troponin 232--> 260--> 276, continue trending Repeat EKG this morning  Pending Chest xray, d-dimer, BNP   0530- DDimer 18.98. Obtain STAT recheck. BLE venous US  ordered to r/o DVT. Not on DVT prophylaxis.  Slightly elevated BNP. Xray pending.  No respiratory changes or distress.    Interventions/ Plan    EKG Troponin - 232--> 260--> 276 IV Morphine  PRN Nitroglycerin Supplemental Oxygen as needed Statin, lipid panel Platelet --> 69--> 71 --> 92.  Obtain coags Chest xray, d-dimer, BNP Cardiology will see pt in the morning.         Shriley Joffe, DNP, ACNPC- AG [facility] [place]

## 2023-07-16 ENCOUNTER — Inpatient Hospital Stay (HOSPITAL_COMMUNITY): Payer: Medicare (Managed Care)

## 2023-07-16 DIAGNOSIS — Z515 Encounter for palliative care: Secondary | ICD-10-CM | POA: Diagnosis not present

## 2023-07-16 DIAGNOSIS — R7989 Other specified abnormal findings of blood chemistry: Secondary | ICD-10-CM

## 2023-07-16 DIAGNOSIS — R079 Chest pain, unspecified: Secondary | ICD-10-CM

## 2023-07-16 DIAGNOSIS — G9341 Metabolic encephalopathy: Secondary | ICD-10-CM | POA: Diagnosis not present

## 2023-07-16 LAB — CBC
HCT: 31.7 % — ABNORMAL LOW (ref 36.0–46.0)
HCT: 31.1 % — ABNORMAL LOW (ref 36.0–46.0)
Hemoglobin: 10.1 g/dL — ABNORMAL LOW (ref 12.0–15.0)
Hemoglobin: 9.5 g/dL — ABNORMAL LOW (ref 12.0–15.0)
MCH: 30 pg (ref 26.0–34.0)
MCH: 30.2 pg (ref 26.0–34.0)
MCHC: 31.9 g/dL (ref 30.0–36.0)
MCHC: 30.5 g/dL (ref 30.0–36.0)
MCV: 94.1 fL (ref 80.0–100.0)
MCV: 98.7 fL (ref 80.0–100.0)
Platelets: 92 10*3/uL — ABNORMAL LOW (ref 150–400)
Platelets: 96 10*3/uL — ABNORMAL LOW (ref 150–400)
RBC: 3.37 MIL/uL — ABNORMAL LOW (ref 3.87–5.11)
RBC: 3.15 MIL/uL — ABNORMAL LOW (ref 3.87–5.11)
RDW: 15.9 % — ABNORMAL HIGH (ref 11.5–15.5)
RDW: 16.3 % — ABNORMAL HIGH (ref 11.5–15.5)
WBC: 6 10*3/uL (ref 4.0–10.5)
WBC: 7 10*3/uL (ref 4.0–10.5)
nRBC: 0 % (ref 0.0–0.2)
nRBC: 0 % (ref 0.0–0.2)

## 2023-07-16 LAB — TROPONIN I (HIGH SENSITIVITY)
Troponin I (High Sensitivity): 232 ng/L (ref <18)
Troponin I (High Sensitivity): 260 ng/L (ref <18)
Troponin I (High Sensitivity): 276 ng/L (ref <18)
Troponin I (High Sensitivity): 216 ng/L (ref <18)

## 2023-07-16 LAB — BASIC METABOLIC PANEL WITH GFR
Anion gap: 10 (ref 5–15)
Anion gap: 10 (ref 5–15)
BUN: 17 mg/dL (ref 8–23)
BUN: 16 mg/dL (ref 8–23)
CO2: 20 mmol/L — ABNORMAL LOW (ref 22–32)
CO2: 18 mmol/L — ABNORMAL LOW (ref 22–32)
Calcium: 8.8 mg/dL — ABNORMAL LOW (ref 8.9–10.3)
Calcium: 8.6 mg/dL — ABNORMAL LOW (ref 8.9–10.3)
Chloride: 102 mmol/L (ref 98–111)
Chloride: 107 mmol/L (ref 98–111)
Creatinine, Ser: 0.93 mg/dL (ref 0.44–1.00)
Creatinine, Ser: 0.95 mg/dL (ref 0.44–1.00)
GFR, Estimated: 59 mL/min — ABNORMAL LOW (ref >60)
GFR, Estimated: 58 mL/min — ABNORMAL LOW (ref >60)
Glucose, Bld: 107 mg/dL — ABNORMAL HIGH (ref 70–99)
Glucose, Bld: 120 mg/dL — ABNORMAL HIGH (ref 70–99)
Potassium: 3.7 mmol/L (ref 3.5–5.1)
Potassium: 4.2 mmol/L (ref 3.5–5.1)
Sodium: 132 mmol/L — ABNORMAL LOW (ref 135–145)
Sodium: 135 mmol/L (ref 135–145)

## 2023-07-16 LAB — LIPID PANEL
Cholesterol: 176 mg/dL (ref 0–200)
HDL: 55 mg/dL (ref >40)
LDL Cholesterol: 104 mg/dL — ABNORMAL HIGH (ref 0–99)
Total CHOL/HDL Ratio: 3.2 ratio
Triglycerides: 85 mg/dL (ref <150)
VLDL: 17 mg/dL (ref 0–40)

## 2023-07-16 LAB — APTT: aPTT: 47 s — ABNORMAL HIGH (ref 24–36)

## 2023-07-16 LAB — D-DIMER, QUANTITATIVE
D-Dimer, Quant: 18.98 ug{FEU}/mL — ABNORMAL HIGH (ref 0.00–0.50)
D-Dimer, Quant: 18.87 ug{FEU}/mL — ABNORMAL HIGH (ref 0.00–0.50)

## 2023-07-16 LAB — FIBRINOGEN: Fibrinogen: 301 mg/dL (ref 210–475)

## 2023-07-16 LAB — HEPARIN LEVEL (UNFRACTIONATED): Heparin Unfractionated: 0.4 [IU]/mL (ref 0.30–0.70)

## 2023-07-16 LAB — PROTIME-INR
INR: 1.4 — ABNORMAL HIGH (ref 0.8–1.2)
Prothrombin Time: 16.9 s — ABNORMAL HIGH (ref 11.4–15.2)

## 2023-07-16 LAB — BRAIN NATRIURETIC PEPTIDE: B Natriuretic Peptide: 139.4 pg/mL — ABNORMAL HIGH (ref 0.0–100.0)

## 2023-07-16 MED ORDER — HEPARIN (PORCINE) 25000 UT/250ML-% IV SOLN
950.0000 [IU]/h | INTRAVENOUS | Status: AC
  Administered 2023-07-16 – 2023-07-17 (×2): 950 [IU]/h via INTRAVENOUS
  Filled 2023-07-16 (×2): qty 250

## 2023-07-16 MED ORDER — ORAL CARE MOUTH RINSE: 15.0000 mL | OROMUCOSAL | Status: DC | PRN

## 2023-07-16 MED ORDER — HEPARIN BOLUS VIA INFUSION
1800.0000 [IU] | Freq: Once | INTRAVENOUS | Status: AC
  Administered 2023-07-16: 1800 [IU] via INTRAVENOUS
  Filled 2023-07-16: qty 1800

## 2023-07-16 MED ORDER — ROSUVASTATIN CALCIUM 10 MG PO TABS
10.0000 mg | ORAL_TABLET | Freq: Every day | ORAL | Status: DC
  Administered 2023-07-16 – 2023-07-22 (×7): 10 mg via ORAL
  Filled 2023-07-16 (×8): qty 1

## 2023-07-16 MED ORDER — IOHEXOL 350 MG/ML SOLN
75.0000 mL | Freq: Once | INTRAVENOUS | Status: AC | PRN
  Administered 2023-07-16: 75 mL via INTRAVENOUS

## 2023-07-16 MED ORDER — NITROGLYCERIN 0.4 MG SL SUBL: 0.4000 mg | SUBLINGUAL_TABLET | SUBLINGUAL | Status: DC | PRN

## 2023-07-16 MED ORDER — ENOXAPARIN SODIUM 40 MG/0.4ML IJ SOSY: 40.0000 mg | PREFILLED_SYRINGE | INTRAMUSCULAR | Status: DC

## 2023-07-16 NOTE — Progress Notes (Signed)
 PHARMACY - ANTICOAGULATION CONSULT NOTE  Pharmacy Consult for heparin Indication: pulmonary embolus  No Known Allergies  Patient Measurements: Height: 5\' 3"  (160 cm) Weight: 68 kg (150 lb) IBW/kg (Calculated) : 52.4 HEPARIN DW (KG): 66.3  Vital Signs: Temp: 98.3 F (36.8 C) (05/17 2001) Temp Source: Oral (05/17 2001) BP: 128/83 (05/17 2001) Pulse Rate: 90 (05/17 2001)  Labs: Recent Labs    07/15/23 0353 07/15/23 2308 07/16/23 0107 07/16/23 0242 07/16/23 0609 07/16/23 0749 07/16/23 1409 07/16/23 2058  HGB 9.2*  --  10.1*  --   --   --  9.5*  --   HCT 28.5*  --  31.7*  --   --   --  31.1*  --   PLT 71*  --  92*  --   --   --  96*  --   APTT  --   --   --   --  47*  --   --   --   LABPROT  --   --   --   --  16.9*  --   --   --   INR  --   --   --   --  1.4*  --   --   --   HEPARINUNFRC  --   --   --   --   --   --   --  0.40  CREATININE 0.88  --  0.93  --   --   --  0.95  --   TROPONINIHS  --    < > 260* 276* 250* 216*  --   --    < > = values in this interval not displayed.    Estimated Creatinine Clearance: 37.9 mL/min (by C-G formula based on SCr of 0.95 mg/dL).   Medical History: Past Medical History:  Diagnosis Date   Allergic rhinitis    Anxiety    Aortic valve regurgitation 10/12/2010   CKD (chronic kidney disease) stage 3, GFR 30-59 ml/min (HCC) 09/26/2017   COPD (chronic obstructive pulmonary disease) (HCC)    Disc disease, degenerative, cervical    Diverticulosis of colon    DJD (degenerative joint disease)    LS spine   Gallstones    History of colonic polyps    Hyperlipidemia    Hypertension    Osteoporosis     Medications: No anticoagulants PTA  Assessment: Pt is an 51 yoF with PMH significant for COPD, HTN, HLD, recurrent falls. CTA positive for bilateral PE. Pharmacy consulted to dose heparin.   Today, 07/16/23 CBC: Hgb, Plt both low. INR 1.4 at baseline SCr <1 Room air  2058 1st HL 0.4 therapeutic on 950 units/hr Per RN no  bleeding  Goal of Therapy:  Heparin level 0.3-0.7 units/ml Monitor platelets by anticoagulation protocol: Yes   Plan:  continue heparin infusion at 950 units/hr Check 8 hour heparin level CBC, heparin level daily Monitor for signs of bleeding  [place] RPh 07/16/2023, 9:52 PM

## 2023-07-16 NOTE — Progress Notes (Signed)
 PHARMACY - ANTICOAGULATION CONSULT NOTE  Pharmacy Consult for heparin Indication: pulmonary embolus  No Known Allergies  Patient Measurements: Height: 5\' 3"  (160 cm) Weight: 68 kg (150 lb) IBW/kg (Calculated) : 52.4 HEPARIN DW (KG): 66.3  Vital Signs: Temp: 97.8 F (36.6 C) (05/17 1234) Temp Source: Oral (05/17 1234) BP: 134/81 (05/17 1234) Pulse Rate: 83 (05/17 1234)  Labs: Recent Labs    07/14/23 1648 07/14/23 1704 07/15/23 0353 07/15/23 2308 07/16/23 0107 07/16/23 0242 07/16/23 0609 07/16/23 0749  HGB 9.4* 10.2* 9.2*  --  10.1*  --   --   --   HCT 29.2* 30.0* 28.5*  --  31.7*  --   --   --   PLT 69*  --  71*  --  92*  --   --   --   APTT  --   --   --   --   --   --  47*  --   LABPROT  --   --   --   --   --   --  16.9*  --   INR  --   --   --   --   --   --  1.4*  --   CREATININE 1.01* 1.00 0.88  --  0.93  --   --   --   TROPONINIHS  --   --   --    < > 260* 276* 250* 216*   < > = values in this interval not displayed.    Estimated Creatinine Clearance: 38.7 mL/min (by C-G formula based on SCr of 0.93 mg/dL).   Medical History: Past Medical History:  Diagnosis Date   Allergic rhinitis    Anxiety    Aortic valve regurgitation 10/12/2010   CKD (chronic kidney disease) stage 3, GFR 30-59 ml/min (HCC) 09/26/2017   COPD (chronic obstructive pulmonary disease) (HCC)    Disc disease, degenerative, cervical    Diverticulosis of colon    DJD (degenerative joint disease)    LS spine   Gallstones    History of colonic polyps    Hyperlipidemia    Hypertension    Osteoporosis     Medications: No anticoagulants PTA  Assessment: Pt is an 52 yoF with PMH significant for COPD, HTN, HLD, recurrent falls. CTA positive for bilateral PE. Pharmacy consulted to dose heparin.   Today, 07/16/23 CBC: Hgb, Plt both low. INR 1.4 at baseline SCr <1 Room air  Goal of Therapy:  Heparin level 0.3-0.7 units/ml Monitor platelets by anticoagulation protocol: Yes   Plan:   Heparin bolus of 1800 units IV once Initiate heparin infusion at 950 units/hr Check 8 hour heparin level CBC, heparin level daily Monitor for signs of bleeding  Shireen Dory, PharmD 07/16/2023,1:27 PM

## 2023-07-16 NOTE — Evaluation (Signed)
 Occupational Therapy Evaluation Patient Details Name: Megan Baldwin MRN: [id number] DOB: 1933/07/25 Today's Date: 07/16/2023   History of Present Illness   Pt presented with AMS with recent fall at home alone. PMH: anxiety, COPD, mild cognitive impairment, vitamin B12 deficiency, osteoarthritis, HLD, HTN, DDD, and osteoporosis.     Clinical Impressions The pt is currently presenting with the below listed deficits, which compromises her ADL performance and overall functional independence (see OT problem list). During the session, she reported discomfort to her L LE, described as a "strained" feeling. She also reported a history of 2 falls at home. She was noted to be with disorientation to month and year. She had difficulty with memory/recall, as well as for occasional difficulty with organization of thoughts and problem solving. At current, OT anticipates the pt would have difficulty with managing IADL tasks, such as medication administration and finances. She further required mod assist for lower body dressing and CGA to stand using a RW. She will benefit from further OT services to maximize her independence with self-care tasks. If she is unable to have in-home supervision and assistance as needed, short-term SNF rehab is recommended.      If plan is discharge home, recommend the following:   Direct supervision/assist for medications management;Supervision due to cognitive status;Direct supervision/assist for financial management;Assist for transportation;Help with stairs or ramp for entrance;Assistance with cooking/housework     Functional Status Assessment   Patient has had a recent decline in their functional status and demonstrates the ability to make significant improvements in function in a reasonable and predictable amount of time.     Equipment Recommendations   Other (comment) (defer to next level of care)     Recommendations for Other Services          Precautions/Restrictions   Precautions Precautions: Fall Restrictions Weight Bearing Restrictions Per Provider Order: No     Mobility Bed Mobility Overal bed mobility: Needs Assistance             General bed mobility comments: pt was received seated in the bedside chair    Transfers Overall transfer level: Needs assistance Equipment used: Rolling walker (2 wheels) Transfers: Sit to/from Stand Sit to Stand: Contact guard assist                  Balance     Sitting balance-Leahy Scale: Good         Standing balance comment: CGA to min assist with RW              ADL either performed or assessed with clinical judgement   ADL Overall ADL's : Needs assistance/impaired Eating/Feeding: Set up;Sitting   Grooming: Set up;Sitting;Supervision/safety           Upper Body Dressing : Minimal assistance;Sitting   Lower Body Dressing: Moderate assistance;Sitting/lateral leans Lower Body Dressing Details (indicate cue type and reason): Pt had pronounced difficulty doffing and donning her L sock seated in the chair. She reported feelings of L LE weakness and knee discomfort. Toilet Transfer: Minimal assistance;Rolling walker (2 wheels);Ambulation Toilet Transfer Details (indicate cue type and reason): at bathroom level, based on clinical judgement Toileting- Clothing Manipulation and Hygiene: Minimal assistance;Sit to/from stand Toileting - Clothing Manipulation Details (indicate cue type and reason): at bathroom level, based on clinical judgement              Pertinent Vitals/Pain Pain Assessment Pain Assessment: No/denies pain Pain Location: She denied having pain, however reported discomfort to her L knee, which  she described as feeling "strained" Pain Intervention(s): Monitored during session     Extremity/Trunk Assessment Upper Extremity Assessment Upper Extremity Assessment: Overall WFL for tasks assessed;Right hand dominant   Lower  Extremity Assessment RLE Deficits / Details: defer to PT eval LLE Deficits / Details: defer to PT eval; pt reported discomfort to her knee, described as feeling "strained"       Communication Communication Communication: No apparent difficulties   Cognition Arousal: Alert Behavior During Therapy: WFL for tasks assessed/performed Cognition: No family/caregiver present to determine baseline, Cognition impaired   Orientation impairments: Time   Memory impairment (select all impairments): Declarative long-term memory Attention impairment (select first level of impairment): Alternating attention, Divided attention Executive functioning impairment (select all impairments): Organization, Problem solving OT - Cognition Comments: Oriented to person and place. She was disoriented to month and year, and was partially oriented to situation.                                    Home Living Family/patient expects to be discharged to:: Private residence Living Arrangements: Alone Available Help at Discharge: Friend(s);Available PRN/intermittently Type of Home: House Home Access: Stairs to enter [employer] of Steps: ~3   Home Layout: One level     Bathroom Shower/Tub: Tub/shower unit         Home Equipment: Agricultural consultant (2 wheels)   Additional Comments: Pt was a questionable historian at times, therefore information reported should be verified.      Prior Functioning/Environment Prior Level of Function : Independent/Modified Independent             Mobility Comments: She reported use of a RW "all the time" for ambulation. ADLs Comments: She reported being independent with ADLs, cooking, and cleaning. She does not drive, rather has a friend who takes her to run errands.    OT Problem List: Decreased strength;Impaired balance (sitting and/or standing);Decreased cognition;Decreased safety awareness   OT Treatment/Interventions: Self-care/ADL  training;Therapeutic exercise;Therapeutic activities;Energy conservation;Patient/family education;DME and/or AE instruction;Balance training      OT Goals(Current goals can be found in the care plan section)   Acute Rehab OT Goals OT Goal Formulation: With patient Time For Goal Achievement: 07/30/23 Potential to Achieve Goals: Good ADL Goals Pt Will Perform Grooming: with supervision;standing Pt Will Perform Lower Body Dressing: with supervision;sit to/from stand;sitting/lateral leans Pt Will Transfer to Toilet: with supervision;ambulating Pt Will Perform Toileting - Clothing Manipulation and hygiene: with supervision;sit to/from stand   OT Frequency:  Min 2X/week       AM-PAC OT "6 Clicks" Daily Activity     Outcome Measure Help from another person eating meals?: A Little Help from another person taking care of personal grooming?: A Little Help from another person toileting, which includes using toliet, bedpan, or urinal?: A Little Help from another person bathing (including washing, rinsing, drying)?: A Lot Help from another person to put on and taking off regular upper body clothing?: A Little Help from another person to put on and taking off regular lower body clothing?: A Lot 6 Click Score: 16   End of Session Equipment Utilized During Treatment: Rolling walker (2 wheels) Nurse Communication: Mobility status  Activity Tolerance: Other (comment) (Fair tolerance) Patient left: in chair;with call bell/phone within reach;with chair alarm set  OT Visit Diagnosis: Unsteadiness on feet (R26.81);History of falling (Z91.81);Muscle weakness (generalized) (M62.81)  Time: 1030-1050 OT Time Calculation (min): 20 min Charges:  OT General Charges $OT Visit: 1 Visit OT Evaluation $OT Eval Moderate Complexity: 1 Mod    Kale Rondeau L Marnette Perkins, OTR/L 07/16/2023, 12:16 PM

## 2023-07-16 NOTE — Consult Note (Signed)
 CARDIOLOGY CONSULT NOTE    Patient ID: Megan Baldwin MRN: [id number], DOB/AGE: 1933/12/30 89 y.o.  Admit date: 07/14/2023 Date of Consult: 07/16/2023  Primary Physician: Roslyn Coombe, MD Primary Cardiologist: not established   Patient Profile: Megan Baldwin is a 89 y.o. female with a history of hypertension, hyperlipidemia, COPD, mild cognitive impairment who is being seen today for the evaluation of troponin elevation at the request of Dr. Betsey Brow.  HPI:  Megan Baldwin is a 89 y.o. female admitted with acute metabolic encephalopathy in the setting of acute cystitis and normocytic anemia.  The patient has a history of recurrent falls with multiple episodes in the past 2 months.  Family and friends have noticed a decline in her cognition.  She was found down by the patient's friend and presented to the ER on May 15 where she was found to have cystitis.  She denies having any chest pain or abdominal discomfort, shortness of breath in conversation with me. But, the note from last evening at 2308 documents the patient complained of 7 out of 10 chest pressure and discomfort.  The discomfort was described as midsternal and pressure-like, nonradiating, and nonreproducible with palpation.  She has had chest pain in the recent past attributed to injury from fall; this discomfort however was reproducible with palpation.  A troponin level was checked and was found to be elevated, with a relatively flat trajectory and a maximum value of 276.  She denies chest pain, palpitations, dyspnea, PND, orthopnea, nausea, vomiting, dizziness, syncope, edema, or weight gain.  Past Medical History:  Diagnosis Date   Allergic rhinitis    Anxiety    Aortic valve regurgitation 10/12/2010   CKD (chronic kidney disease) stage 3, GFR 30-59 ml/min (HCC) 09/26/2017   COPD (chronic obstructive pulmonary disease) (HCC)    Disc disease, degenerative, cervical    Diverticulosis of colon    DJD (degenerative joint  disease)    LS spine   Gallstones    History of colonic polyps    Hyperlipidemia    Hypertension    Osteoporosis       Home medications Medications Prior to Admission  Medication Sig Dispense Refill Last Dose/Taking   amLODipine  (NORVASC ) 5 MG tablet Take 1 tablet (5 mg total) by mouth daily. 90 tablet 3 Unknown   labetalol  (NORMODYNE ) 200 MG tablet Take 1 tablet (200 mg total) by mouth 2 (two) times daily. 180 tablet 3 Unknown   spironolactone  (ALDACTONE ) 50 MG tablet Take 1 tablet (50 mg total) by mouth 2 (two) times daily. 180 tablet 3 Unknown   traMADol  (ULTRAM ) 50 MG tablet Take 1 tablet (50 mg total) by mouth every 6 (six) hours as needed. (Patient taking differently: Take 50 mg by mouth every 6 (six) hours as needed (for pain).) 60 tablet 2 07/13/2023 Bedtime   vitamin B-12 (CYANOCOBALAMIN ) 1000 MCG tablet Take 1 tablet (1,000 mcg total) by mouth daily. 90 tablet 3 Unknown   diazepam  (VALIUM ) 5 MG tablet TAKE 1 TABLET (5 MG TOTAL) BY MOUTH EVERY 12 (TWELVE) HOURS AS NEEDED FOR ANXIETY. (Patient not taking: Reported on 07/14/2023) 60 tablet 2 Not Taking      Physical Exam: Vitals:   07/15/23 1216 07/15/23 1957 07/15/23 2229 07/16/23 0254  BP: 127/74 136/62 (!) 149/71 (!) 152/78  Pulse: 82 79 78 77  Resp: 19 20 20 18   Temp: 98.9 F (37.2 C) 98.3 F (36.8 C)  99.3 F (37.4 C)  TempSrc: Oral Oral  Oral  SpO2: 99% 98%  99% 100%  Weight:      Height:        Gen: Appears comfortable, well-nourished CV: RRR, no dependent edema Pulm: breathing easily, no rales  PERTINENT STUDIES SUMMARIZED:  EKG: Sinus rhythm, PAC; no ischemic changes (personally reviewed)  TELEMETRY:    Sinus rhythm (personally reviewed)  ASSESSMENT & PLAN:  Troponin elevation, chest pain Troponin is slightly elevated and in a fairly flat trajectory Would arrange outpatient cardiology follow-up I have concern with starting aspirin given her platelet levels as low as 69 and frequent falls Continue  rosuvastatin   Acute cystitis Managed with Rocephin       For questions or updates, please contact [facility] Please consult [email or web address] for contact info under Cardiology/STEMI.  Signed, Marlane Silver, MD 07/16/2023 12:03 PM

## 2023-07-16 NOTE — Progress Notes (Addendum)
 PROGRESS NOTE  Megan Baldwin  WJX:[id number] DOB: [birth year] DOA: 07/14/2023 PCP: Roslyn Coombe, MD  Consultants  Brief Narrative: 89 y.o. female with medical history significant for anxiety, COPD, mild cognitive impairment, vitamin B12 deficiency, osteoarthritis, HLD, HTN, DDD, and osteoporosis who presented to EMS for evaluation of altered mental status.  Per daughter, patient's friend came by her house and noticed that patient was on the ground. Patient has history of recurrent falls and has fallen about 3 times in the past 2 months (daughter thinks this is likely due to patient not using her walker appropriately). Patient has been complaining of some left leg pain and over the last year, has had a decline in her cognition. Brought to ED for same, found to have UTI.  Admitted for the same.    Assessment & Plan: Acute PE: - Patient had onset of chest pain last night.  Troponins showed slight elevation.  Cardiology consulted, appreciate input.   - D dimer elevated.  Repeat also elevated, therefore CTA ordered this AM - on exam, [name] was awake and alert, eating breakfast.  Appeared very comfortable.  Denied any chest pain.  Not requiring any oxygen.  Heart RRR, lungs were clear.  Trace LE edema, no calf tenderness on my exam. - she has been on combination TED plus SCDs - Called by radiologist for BL PE.  Plan will be to start heparin and discuss treatment with daughter when she arrives in town.   - Unclear how long she's had PE in light of how comfortable appearing she's been on exam, and fact troponins were already 232 by time of measurement downtrend soon thereafter, even prior to treatment. Wonder if being down for prolonged period prior to being found at home provoked VTE.  Acute metabolic encephalopathy - Initially presumed in the setting of UTI with underlying mild cognitive impairment and memory loss, now also with diagnosed PE.  - see UTI below -- cultures negative, so less likely  UTI as culprit. - wonder now if acute PE led to change in mental status at home - CT head unremarkable, no electrolyte or metabolic abnormalities - On chart review, patient has had gradual decline in cognition over the last year per PCP -- see B12 below - Will need outpatient neurocognitive eval to formally diagnose dementia - will discuss with daughter how she is now compared to her baseline.     # Acute cystitis, working diagnosis thus far: - Presented with altered mental status and concern for infection on U/A.  Since admit, though, she's been afebrile, with no leukocytosis - culture is no growth, making UTI much less likely.   - will stop abx and watch for any changes   # Normocytic anemia - Hgb stabilized at 10.1  Watch in light of starting heparin - No evidence of active bleed or bruising. - Has severe vitamin B12 deficiency  # Thrombocytopenia: - watch platelets, now on heparin   # HTN - BP stable with SBP in the 130s to 140s - Patient tried amlodipine , spironolactone  and labetalol , PCP suspects medication noncompliance - Resume low-dose amlodipine  for now, can add other BP meds as needed   # Vitamin B12 deficiency - Vitamin B12 level significantly low at 73 2 months ago - This is likely contributing to patient's cognitive deficits and short term memory loss - Start IM vitamin B12 1000 mcg daily injections x3 doses while inpatient - Resume oral supplementation at discharge   # Recurrent falls # Bilateral hip osteoarthritis -  Per daughter, patient has history of falls and has fallen about 3 times in the past 2 months - X-ray of the hips shows advanced osteoarthritis bilaterally but no acute bony abnormality - as noted above, it's unclear how long she was down at home with most recent fall - Pain control with scheduled Tylenol  and Voltaren  gel - PT/OT eval and treat - Fall precautions   # BLE edema - Has 1+ BLE edema at baseline, improving since admit, thought due to  venous insufficiency by her PCP - longstanding   # COPD - Stable -As needed DuoNebs   # Anxiety - Stable     DVT prophylaxis:  heparin bolus via infusion 1,800 Units Start: 07/16/23 1330 Place TED hose Start: 07/14/23 2037 Place and maintain sequential compression device Start: 07/14/23 1911  Code Status:   Code Status: Full Code Family Communication: called daughter Level of care: Med-Surg Status is: Inpatient Dispo:  pending resolution of UTI   Consults called: cardiology   Subjective: Patient awake and alert this AM.  Knows she's in the hospital, remembers me from yesterday.  Eating breakfast, no chest pain/SOB/palpitations.  No complaints.  Had onset of chest pain last night, see workup under PE above.     Objective: Vitals:   07/15/23 1957 07/15/23 2229 07/16/23 0254 07/16/23 1234  BP: 136/62 (!) 149/71 (!) 152/78 134/81  Pulse: 79 78 77 83  Resp: 20 20 18    Temp: 98.3 F (36.8 C)  99.3 F (37.4 C) 97.8 F (36.6 C)  TempSrc: Oral  Oral Oral  SpO2: 98% 99% 100% 100%  Weight:      Height:        Intake/Output Summary (Last 24 hours) at 07/16/2023 1239 Last data filed at 07/16/2023 0900 Gross per 24 hour  Intake 60 ml  Output 1850 ml  Net -1790 ml   Filed Weights   07/14/23 1614  Weight: 68 kg   Body mass index is 26.57 kg/m.  Gen: 89 y.o. female in no apparent distress.  Nontoxic Pulm: Non-labored breathing.  Clear to auscultation bilaterally.  CV: Regular rate and rhythm.  GI: Abdomen soft, non-tender, non-distended, with normoactive bowel sounds. No organomegaly or masses felt. Ext: Warm, no deformities, trace pedal edema Skin: No rashes, lesions  Neuro: Alert, oriented to person and hospital, not which one, not year.  Moving all limbs symmetrically. Psych: Calm. Mood & affect appropriate.     I have personally reviewed the following labs and images: CBC: Recent Labs  Lab 07/14/23 1648 07/14/23 1704 07/15/23 0353 07/16/23 0107  WBC 5.4   --  4.2 6.0  NEUTROABS 3.4  --   --   --   HGB 9.4* 10.2* 9.2* 10.1*  HCT 29.2* 30.0* 28.5* 31.7*  MCV 93.6  --  93.4 94.1  PLT 69*  --  71* 92*   BMP &GFR Recent Labs  Lab 07/14/23 1648 07/14/23 1704 07/15/23 0353 07/16/23 0107  NA 134* 135 135 132*  K 4.0 4.1 3.7 3.7  CL 103 105 106 102  CO2 21*  --  19* 20*  GLUCOSE 83 78 72 107*  BUN 21 20 17 17   CREATININE 1.01* 1.00 0.88 0.93  CALCIUM  9.2  --  8.4* 8.8*   Estimated Creatinine Clearance: 38.7 mL/min (by C-G formula based on SCr of 0.93 mg/dL). Liver & Pancreas: Recent Labs  Lab 07/14/23 1648  AST 28  ALT 17  ALKPHOS 65  BILITOT 1.0  PROT 7.5  ALBUMIN 3.6  No results for input(s): "LIPASE", "AMYLASE" in the last 168 hours. Recent Labs  Lab 07/14/23 1648  AMMONIA <13   Diabetic: No results for input(s): "HGBA1C" in the last 72 hours. No results for input(s): "GLUCAP" in the last 168 hours. Cardiac Enzymes: No results for input(s): "CKTOTAL", "CKMB", "CKMBINDEX", "TROPONINI" in the last 168 hours. No results for input(s): "PROBNP" in the last 8760 hours. Coagulation Profile: Recent Labs  Lab 07/16/23 0609  INR 1.4*   Thyroid  Function Tests: Recent Labs    07/14/23 1648  TSH 0.901   Lipid Profile: Recent Labs    07/16/23 0451  CHOL 176  HDL 55  LDLCALC 104*  TRIG 85  CHOLHDL 3.2   Anemia Panel: Recent Labs    07/15/23 0353  FOLATE 6.0  FERRITIN 166  TIBC 269  IRON 36   Urine analysis:    Component Value Date/Time   COLORURINE YELLOW 07/14/2023 1642   APPEARANCEUR CLEAR 07/14/2023 1642   LABSPEC 1.014 07/14/2023 1642   PHURINE 6.0 07/14/2023 1642   GLUCOSEU NEGATIVE 07/14/2023 1642   GLUCOSEU NEGATIVE 05/13/2023 1439   HGBUR NEGATIVE 07/14/2023 1642   BILIRUBINUR NEGATIVE 07/14/2023 1642   KETONESUR NEGATIVE 07/14/2023 1642   PROTEINUR NEGATIVE 07/14/2023 1642   UROBILINOGEN 1.0 05/13/2023 1439   NITRITE NEGATIVE 07/14/2023 1642   LEUKOCYTESUR MODERATE (A) 07/14/2023 1642    Sepsis Labs: Invalid input(s): "PROCALCITONIN", "LACTICIDVEN"  Microbiology: Recent Results (from the past 240 hours)  Urine Culture     Status: None   Collection Time: 07/14/23  4:42 PM   Specimen: Urine, Random  Result Value Ref Range Status   Specimen Description   Final    URINE, RANDOM Performed at [facility], [address] [place], [place] [address]    Special Requests   Final    NONE Reflexed from [id number] Performed at [facility], [address] [place], [place] [address]    Culture   Final    NO GROWTH Performed at [facility], [address], [place], [place] [address]    Report Status 07/15/2023 FINAL  Final    Radiology Studies: CT Angio Chest Pulmonary Embolism (PE) W or WO Contrast Result Date: 07/16/2023 CLINICAL DATA:  89 year old female with chest pain. EXAM: CT ANGIOGRAPHY CHEST WITH CONTRAST TECHNIQUE: Multidetector CT imaging of the chest was performed using the standard protocol during bolus administration of intravenous contrast. Multiplanar CT image reconstructions and MIPs were obtained to evaluate the vascular anatomy. RADIATION DOSE REDUCTION: This exam was performed according to the departmental dose-optimization program which includes automated exposure control, adjustment of the mA and/or kV according to patient size and/or use of iterative reconstruction technique. CONTRAST:  75mL OMNIPAQUE IOHEXOL 350 MG/ML SOLN COMPARISON:  Portable chest 0424 hours today.  Chest CT 08/14/2008. FINDINGS: Cardiovascular: Good contrast bolus timing in the pulmonary arterial tree. Right main pulmonary artery thrombus, branching into the lobar arteries, although somewhat discontinuous and partially nonocclusive, flow around much of the clot. No saddle embolus. No left main or upper lobe pulmonary artery filling defect. Segmental left lower lobe PE on series 5, image 125, which appears occlusive and tracks into the anterior basal  segment. Tortuous thoracic aorta with atherosclerosis. No pericardial effusion. Mild cardiomegaly. RV LV ratio is 1.0. Mediastinum/Nodes: Negative for mediastinal mass or lymphadenopathy. Lungs/Pleura: Wedge-shaped left lower lobe segmental opacity, lateral and posterior basal segments, suspicious for pulmonary infarction. Trace left pleural effusion. No right lung infarct or consolidation. Dependent right lung  opacity. No significant right pleural effusion. Major airways are patent. Upper Abdomen: Extensive cholelithiasis. Negative other visible early contrast enhanced upper abdominal viscera. No upper abdominal free air or free fluid. Musculoskeletal: Osteopenia. Chronic T11 superior endplate compression or Schmorl's node is stable since 2010. No acute or suspicious osseous lesion. Review of the MIP images confirms the above findings. IMPRESSION: 1. Positive for bilateral pulmonary PE with multifocal left lower lobe pulmonary infarcts. Trace left pleural effusion. RV/LV Ratio = 1.0 suggesting intermediate risk PE. Please refer to the "Code PE Focused" order set in EPIC. 2.  Aortic Atherosclerosis (ICD10-I70.0).  Cholelithiasis. Electronically Signed   By: Marlise Simpers M.D.   On: 07/16/2023 12:12   DG Chest Port 1 View Result Date: 07/16/2023 CLINICAL DATA:  [id number].  Chest pains. EXAM: PORTABLE CHEST 1 VIEW COMPARISON:  Portable chest 07/14/2023. FINDINGS: 4:24 a.m. Stable mild cardiomegaly. Stable mediastinum with aortic atherosclerosis and uncoiling. Mild increased interstitial consolidation in the bases most likely due to edema as there is also mild central vascular prominence. There are small pleural effusions. No focal pneumonia is evident. No other changes in the overall aeration. Thoracic spondylosis and degenerative change. The patient is rotated to the left. IMPRESSION: 1. Mild increased interstitial consolidation in the bases most likely due to edema as there is also mild central vascular prominence. Small  pleural effusions. 2. Stable mild cardiomegaly. 3. Aortic atherosclerosis. Electronically Signed   By: Denman Fischer M.D.   On: 07/16/2023 05:20    Scheduled Meds:  acetaminophen   1,000 mg Oral TID   amLODipine   5 mg Oral Daily   diclofenac  Sodium  2 g Topical QID   feeding supplement  237 mL Oral BID BM   heparin  1,800 Units Intravenous Once   rosuvastatin   10 mg Oral Daily   Continuous Infusions:  cefTRIAXone  (ROCEPHIN )  IV 1 g (07/15/23 1603)   heparin       LOS: 2 days   35 minutes with more than 50% spent in reviewing records, counseling patient/family and coordinating care.  Trenton Frock, MD [facility] [email or web address] 07/16/2023, 12:39 PM

## 2023-07-16 NOTE — Consult Note (Signed)
 Consultation Note Date: 07/16/2023   Patient Name: Megan Baldwin  DOB: 1933/06/29  MRN: [id number]  Age / Sex: 89 y.o., female  PCP: Roslyn Coombe, MD Referring Physician: Trenton Frock, MD  Reason for Consultation: Establishing goals of care  HPI/Patient Profile: 89 y.o. female  with past medical history of  COPD, mild cognitive impairment, Osteoarthritis, HLD, HTN, DDD, anxiety, B12 deficiency admitted on 07/14/2023 with altered mental status.    Clinical Assessment and Goals of Care:  Patient admitted to hospital medicine service with weakness, falls UTI. Hospital course complicated by chest pain, patient found to have PE.   Palliative consult for goals of care discussions has been recommended.  Palliative medicine is specialized medical care for people living with serious illness. It focuses on providing relief from the symptoms and stress of a serious illness. The goal is to improve quality of life for both the patient and the family. Goals of care: Broad aims of medical therapy in relation to the patient's values and preferences. Our aim is to provide medical care aimed at enabling patients to achieve the goals that matter most to them, given the circumstances of their particular medical situation and their constraints.  Patient remains admitted to hospital service, she has recently been having recurrent falls, she has bilateral hip pain b 12 deficiency.  Acute issues pertaining to this hospitalization are the diagnosis of acute pulmonary embolism and patient has been started on heparin.  CODE STATUS and broad goals of care discussions undertaken.  Patient's family is appreciative of Dr. Betsey Brow from hospitalist service explaining to them in detail about scope of current hospitalization.  HCPOA Daughter Megan Baldwin who has arrived from [place].  Also has a nephew who lives locally.  Patient was living by  herself up until this hospitalization.   SUMMARY OF RECOMMENDATIONS   Full code for now, discussed goals of care with patient, nephew and daughter who has arrived from [place] to be at the bedside.  Patient has previously prepared advance care planning documents, these will be reviewed.  Overall, full code full scope care for now, patient and family does not believe that she would want to be on artificial mechanical means for extended period of time.  We will continue to explore goals of care.  At present, discussed about disposition options.  Patient lives alone.  Discussed about skilled nursing facility rehabilitation attempt with the addition of palliative services.  Patient voices opinion that she would rather go back home towards the end of this hospitalization, however she will continue to discuss with her family with regards to safest possible discharge plan. Continue anticoagulation, continue current mode of care, palliative services to follow along Thank you for the consult  Code Status/Advance Care Planning: Full code   Symptom Management:     Palliative Prophylaxis:  Delirium Protocol  Additional Recommendations (Limitations, Scope, Preferences): Full Scope Treatment  Psycho-social/Spiritual:  Desire for further Chaplaincy support:yes Additional Recommendations: Caregiving  Support/Resources  Prognosis:  Unable to determine  Discharge Planning: Skilled  Nursing Facility for rehab with Palliative care service follow-up      Primary Diagnoses: Present on Admission:  Acute metabolic encephalopathy   I have reviewed the medical record, interviewed the patient and family, and examined the patient. The following aspects are pertinent.  Past Medical History:  Diagnosis Date   Allergic rhinitis    Anxiety    Aortic valve regurgitation 10/12/2010   CKD (chronic kidney disease) stage 3, GFR 30-59 ml/min (HCC) 09/26/2017   COPD (chronic obstructive pulmonary disease)  (HCC)    Disc disease, degenerative, cervical    Diverticulosis of colon    DJD (degenerative joint disease)    LS spine   Gallstones    History of colonic polyps    Hyperlipidemia    Hypertension    Osteoporosis    Social History   Socioeconomic History   Marital status: Widowed    Spouse name: Not on file   Number of children: 2   Years of education: Not on file   Highest education level: Not on file  Occupational History   Occupation: social case worker    Comment: (substance abuse)  Tobacco Use   Smoking status: Former   Smokeless tobacco: Never  Advertising account planner   Vaping status: Never Used  Substance and Sexual Activity   Alcohol use: No    Alcohol/week: 0.0 standard drinks of alcohol   Drug use: No   Sexual activity: Not on file  Other Topics Concern   Not on file  Social History Narrative   Not on file   Social Drivers of Health   Financial Resource Strain: Not on file  Food Insecurity: Patient Unable To Answer (07/14/2023)   Hunger Vital Sign    Worried About Running Out of Food in the Last Year: Patient unable to answer    Ran Out of Food in the Last Year: Patient unable to answer  Transportation Needs: No Transportation Needs (07/08/2023)   PRAPARE - Administrator, Civil Service (Medical): No    Lack of Transportation (Non-Medical): No  Physical Activity: Not on file  Stress: Not on file  Social Connections: Not on file   Family History  Problem Relation Age of Onset   Stroke Other        F 1st degree relative   Parkinsonism Father    Cancer Other        1st degree relative <50   Coronary artery disease Other        F 1st degree relative <60   Diabetes Other        1st degree relative   Hypertension Other    Diabetes Mother    Cancer Mother    Scheduled Meds:  acetaminophen   1,000 mg Oral TID   amLODipine   5 mg Oral Daily   diclofenac  Sodium  2 g Topical QID   feeding supplement  237 mL Oral BID BM   rosuvastatin   10 mg Oral Daily    Continuous Infusions:  heparin 950 Units/hr (07/16/23 1308)   PRN Meds:.ipratropium-albuterol , nitroGLYCERIN, ondansetron  **OR** ondansetron  (ZOFRAN ) IV, mouth rinse, senna-docusate Medications Prior to Admission:  Prior to Admission medications   Medication Sig Start Date End Date Taking? Authorizing Provider  amLODipine  (NORVASC ) 5 MG tablet Take 1 tablet (5 mg total) by mouth daily. 05/13/23  Yes Roslyn Coombe, MD  labetalol  (NORMODYNE ) 200 MG tablet Take 1 tablet (200 mg total) by mouth 2 (two) times daily. 05/13/23  Yes Roslyn Coombe, MD  spironolactone  (ALDACTONE ) 50 MG tablet Take 1 tablet (50 mg total) by mouth 2 (two) times daily. 05/13/23  Yes Roslyn Coombe, MD  traMADol  (ULTRAM ) 50 MG tablet Take 1 tablet (50 mg total) by mouth every 6 (six) hours as needed. Patient taking differently: Take 50 mg by mouth every 6 (six) hours as needed (for pain). 05/13/23  Yes Roslyn Coombe, MD  vitamin B-12 (CYANOCOBALAMIN ) 1000 MCG tablet Take 1 tablet (1,000 mcg total) by mouth daily. 05/28/20  Yes Roslyn Coombe, MD  diazepam  (VALIUM ) 5 MG tablet TAKE 1 TABLET (5 MG TOTAL) BY MOUTH EVERY 12 (TWELVE) HOURS AS NEEDED FOR ANXIETY. Patient not taking: Reported on 07/14/2023 01/26/23   Roslyn Coombe, MD   No Known Allergies Review of Systems +weakness  Physical Exam Weak appearing elderly lady resting in bed Nonlabored breathing Regular work of breathing Abdomen is soft and nontender Patient does not have peripheral edema Awake alert answers a few questions appropriately Mood and affect within normal limits Vital Signs: BP 134/81 (BP Location: Left Arm)   Pulse 83   Temp 97.8 F (36.6 C) (Oral)   Resp 18   Ht 5\' 3"  (1.6 m)   Wt 68 kg   SpO2 100%   BMI 26.57 kg/m  Pain Scale: Not given for pain   Pain Score: 0-No pain   SpO2: SpO2: 100 % O2 Device:SpO2: 100 % O2 Flow Rate: .   IO: Intake/output summary:  Intake/Output Summary (Last 24 hours) at 07/16/2023 1701 Last data filed at  07/16/2023 1500 Gross per 24 hour  Intake 215.49 ml  Output 1850 ml  Net -1634.51 ml    LBM: Last BM Date : 07/15/23 Baseline Weight: Weight: 68 kg Most recent weight: Weight: 68 kg     Palliative Assessment/Data:    PPS 50% Time In:  1700 Time Out:  1815 Time Total:  75 Greater than 50%  of this time was spent counseling and coordinating care related to the above assessment and plan.  Signed by: Lujean Sake, MD   Please contact Palliative Medicine Team phone at [phone] for questions and concerns.  For individual provider: See Tilford Foley

## 2023-07-17 ENCOUNTER — Inpatient Hospital Stay (HOSPITAL_COMMUNITY): Payer: Medicare (Managed Care)

## 2023-07-17 DIAGNOSIS — G9341 Metabolic encephalopathy: Secondary | ICD-10-CM | POA: Diagnosis not present

## 2023-07-17 DIAGNOSIS — R7989 Other specified abnormal findings of blood chemistry: Secondary | ICD-10-CM

## 2023-07-17 DIAGNOSIS — R079 Chest pain, unspecified: Secondary | ICD-10-CM

## 2023-07-17 DIAGNOSIS — Z515 Encounter for palliative care: Secondary | ICD-10-CM

## 2023-07-17 DIAGNOSIS — I2699 Other pulmonary embolism without acute cor pulmonale: Secondary | ICD-10-CM | POA: Diagnosis not present

## 2023-07-17 LAB — URINALYSIS, W/ REFLEX TO CULTURE (INFECTION SUSPECTED)
Bilirubin Urine: NEGATIVE
Glucose, UA: NEGATIVE mg/dL
Hgb urine dipstick: NEGATIVE
Ketones, ur: NEGATIVE mg/dL
Leukocytes,Ua: NEGATIVE
Nitrite: NEGATIVE
Protein, ur: NEGATIVE mg/dL
Specific Gravity, Urine: 1.005 (ref 1.005–1.030)
pH: 7 (ref 5.0–8.0)

## 2023-07-17 LAB — BASIC METABOLIC PANEL WITH GFR
Anion gap: 7 (ref 5–15)
BUN: 15 mg/dL (ref 8–23)
CO2: 22 mmol/L (ref 22–32)
Calcium: 8.5 mg/dL — ABNORMAL LOW (ref 8.9–10.3)
Chloride: 106 mmol/L (ref 98–111)
Creatinine, Ser: 0.86 mg/dL (ref 0.44–1.00)
GFR, Estimated: >60 mL/min (ref >60)
Glucose, Bld: 99 mg/dL (ref 70–99)
Potassium: 3.9 mmol/L (ref 3.5–5.1)
Sodium: 135 mmol/L (ref 135–145)

## 2023-07-17 LAB — ECHOCARDIOGRAM COMPLETE
AR max vel: 3.67 cm2
AV Area VTI: 3.37 cm2
AV Area mean vel: 3.41 cm2
AV Mean grad: 7 mmHg
AV Peak grad: 11.7 mmHg
Ao pk vel: 1.71 m/s
Area-P 1/2: 3.34 cm2
Height: 63 in
MV VTI: 2.1 cm2
P 1/2 time: 372 ms
S' Lateral: 2 cm
Weight: 2400 [oz_av]

## 2023-07-17 LAB — CBC
HCT: 31.7 % — ABNORMAL LOW (ref 36.0–46.0)
Hemoglobin: 9.7 g/dL — ABNORMAL LOW (ref 12.0–15.0)
MCH: 29.7 pg (ref 26.0–34.0)
MCHC: 30.6 g/dL (ref 30.0–36.0)
MCV: 96.9 fL (ref 80.0–100.0)
Platelets: 106 10*3/uL — ABNORMAL LOW (ref 150–400)
RBC: 3.27 MIL/uL — ABNORMAL LOW (ref 3.87–5.11)
RDW: 16.2 % — ABNORMAL HIGH (ref 11.5–15.5)
WBC: 8.7 10*3/uL (ref 4.0–10.5)
nRBC: 0 % (ref 0.0–0.2)

## 2023-07-17 LAB — TROPONIN I (HIGH SENSITIVITY): Troponin I (High Sensitivity): 250 ng/L (ref <18)

## 2023-07-17 LAB — HEPARIN LEVEL (UNFRACTIONATED): Heparin Unfractionated: 0.47 [IU]/mL (ref 0.30–0.70)

## 2023-07-17 MED ORDER — DONEPEZIL HCL 5 MG PO TABS
5.0000 mg | ORAL_TABLET | Freq: Every day | ORAL | Status: DC
  Administered 2023-07-17 – 2023-07-21 (×5): 5 mg via ORAL
  Filled 2023-07-17 (×5): qty 1

## 2023-07-17 MED ORDER — APIXABAN 5 MG PO TABS
10.0000 mg | ORAL_TABLET | Freq: Two times a day (BID) | ORAL | Status: DC
  Administered 2023-07-17 – 2023-07-22 (×10): 10 mg via ORAL
  Filled 2023-07-17 (×10): qty 2

## 2023-07-17 MED ORDER — SODIUM CHLORIDE 0.9 % IV SOLN
1.0000 g | INTRAVENOUS | Status: DC
  Administered 2023-07-17 – 2023-07-19 (×3): 1 g via INTRAVENOUS
  Filled 2023-07-17 (×3): qty 10

## 2023-07-17 MED ORDER — APIXABAN 5 MG PO TABS: 5.0000 mg | ORAL_TABLET | Freq: Two times a day (BID) | ORAL | Status: DC

## 2023-07-17 NOTE — Progress Notes (Signed)
 PHARMACY - ANTICOAGULATION CONSULT NOTE  Pharmacy Consult for heparin >> Eliquis Indication: pulmonary embolus  No Known Allergies  Patient Measurements: Height: 5\' 3"  (160 cm) Weight: 68 kg (150 lb) IBW/kg (Calculated) : 52.4 HEPARIN DW (KG): 66.3  Vital Signs: Temp: 99.3 F (37.4 C) (05/18 1324) Temp Source: Oral (05/18 1324) BP: 145/72 (05/18 1324) Pulse Rate: 81 (05/18 1324)  Labs: Recent Labs    07/16/23 0107 07/16/23 0242 07/16/23 0609 07/16/23 0749 07/16/23 1409 07/16/23 2058 07/17/23 0705  HGB 10.1*  --   --   --  9.5*  --  9.7*  HCT 31.7*  --   --   --  31.1*  --  31.7*  PLT 92*  --   --   --  96*  --  106*  APTT  --   --  47*  --   --   --   --   LABPROT  --   --  16.9*  --   --   --   --   INR  --   --  1.4*  --   --   --   --   HEPARINUNFRC  --   --   --   --   --  0.40 0.47  CREATININE 0.93  --   --   --  0.95  --  0.86  TROPONINIHS 260* 276* 250* 216*  --   --   --     Estimated Creatinine Clearance: 41.8 mL/min (by C-G formula based on SCr of 0.86 mg/dL).   Medical History: Past Medical History:  Diagnosis Date   Allergic rhinitis    Anxiety    Aortic valve regurgitation 10/12/2010   CKD (chronic kidney disease) stage 3, GFR 30-59 ml/min (HCC) 09/26/2017   COPD (chronic obstructive pulmonary disease) (HCC)    Disc disease, degenerative, cervical    Diverticulosis of colon    DJD (degenerative joint disease)    LS spine   Gallstones    History of colonic polyps    Hyperlipidemia    Hypertension    Osteoporosis     Medications: No anticoagulants PTA  Assessment: Pt is an 54 yoF with PMH significant for COPD, HTN, HLD, recurrent falls. CTA positive for bilateral PE. Pharmacy consulted to dose heparin.   Today, 07/17/23 Heparin level 0.47 - remains therapeutic on heparin 950 units/hr Hgb 9.7, plts 106--low, stable  SCr <1 No s/sx of bleeding reported 5/18 doppler + for acute R LE DVT & age indeterminate L DVT To transition to Eliquis  today   Goal of Therapy:  Heparin level 0.3-0.7 units/ml Monitor platelets by anticoagulation protocol: Yes   Plan:  At 2200> DC heparin drip & give first dose of Eliquis DC heparin level, continue CBC Eliquis 10 mg po bid x 7 days followed by Eliquis 5 mg po bid Will educate pt & provide 30 day free card prior to discharge  Rubie Corona, Pharm.D Use secure chat for questions 07/17/2023 4:46 PM

## 2023-07-17 NOTE — Progress Notes (Signed)
 Echocardiogram 2D Echocardiogram has been performed.  Megan Baldwin N Prisila Dlouhy,RDCS 07/17/2023, 11:46 AM

## 2023-07-17 NOTE — Discharge Instructions (Addendum)
 Information on my medicine - ELIQUIS  (apixaban )  This medication education was reviewed with me or my healthcare representative as part of my discharge preparation.  Why was Eliquis  prescribed for you? Eliquis  was prescribed to treat blood clots that may have been found in the veins of your legs (deep vein thrombosis) or in your lungs (pulmonary embolism) and to reduce the risk of them occurring again.  What do You need to know about Eliquis  ? The starting dose is 10 mg (two 5 mg tablets) taken TWICE daily for the FIRST SEVEN (7) DAYS, then the dose is reduced to ONE 5 mg tablet taken TWICE daily.  Eliquis  may be taken with or without food. After discussion with Dr. Farrel Hones, you will take one 5mg  tablet TWICE daily when discharged from the hospital.  Try to take the dose about the same time in the morning and in the evening. If you have difficulty swallowing the tablet whole please discuss with your pharmacist how to take the medication safely.  Take Eliquis  exactly as prescribed and DO NOT stop taking Eliquis  without talking to the doctor who prescribed the medication.  Stopping may increase your risk of developing a new blood clot.  Refill your prescription before you run out.  After discharge, you should have regular check-up appointments with your healthcare provider that is prescribing your Eliquis .    What do you do if you miss a dose? If a dose of ELIQUIS  is not taken at the scheduled time, take it as soon as possible on the same day and twice-daily administration should be resumed. The dose should not be doubled to make up for a missed dose.  Important Safety Information A possible side effect of Eliquis  is bleeding. You should call your healthcare provider right away if you experience any of the following: Bleeding from an injury or your nose that does not stop. Unusual colored urine (red or dark brown) or unusual colored stools (red or black). Unusual bruising for unknown  reasons. A serious fall or if you hit your head (even if there is no bleeding).  Some medicines may interact with Eliquis  and might increase your risk of bleeding or clotting while on Eliquis . To help avoid this, consult your healthcare provider or pharmacist prior to using any new prescription or non-prescription medications, including herbals, vitamins, non-steroidal anti-inflammatory drugs (NSAIDs) and supplements.  This website has more information on Eliquis  (apixaban ): [email or web address] .com/eliquis [name]

## 2023-07-17 NOTE — Progress Notes (Signed)
 PHARMACY - ANTICOAGULATION CONSULT NOTE  Pharmacy Consult for heparin Indication: pulmonary embolus  No Known Allergies  Patient Measurements: Height: 5\' 3"  (160 cm) Weight: 68 kg (150 lb) IBW/kg (Calculated) : 52.4 HEPARIN DW (KG): 66.3  Vital Signs: Temp: 98.7 F (37.1 C) (05/18 0437) Temp Source: Oral (05/18 0437) BP: 120/99 (05/18 0437) Pulse Rate: 87 (05/18 0437)  Labs: Recent Labs    07/16/23 0107 07/16/23 0242 07/16/23 0609 07/16/23 0749 07/16/23 1409 07/16/23 2058 07/17/23 0705  HGB 10.1*  --   --   --  9.5*  --  9.7*  HCT 31.7*  --   --   --  31.1*  --  31.7*  PLT 92*  --   --   --  96*  --  106*  APTT  --   --  47*  --   --   --   --   LABPROT  --   --  16.9*  --   --   --   --   INR  --   --  1.4*  --   --   --   --   HEPARINUNFRC  --   --   --   --   --  0.40 0.47  CREATININE 0.93  --   --   --  0.95  --  0.86  TROPONINIHS 260* 276* 250* 216*  --   --   --     Estimated Creatinine Clearance: 41.8 mL/min (by C-G formula based on SCr of 0.86 mg/dL).   Medical History: Past Medical History:  Diagnosis Date   Allergic rhinitis    Anxiety    Aortic valve regurgitation 10/12/2010   CKD (chronic kidney disease) stage 3, GFR 30-59 ml/min (HCC) 09/26/2017   COPD (chronic obstructive pulmonary disease) (HCC)    Disc disease, degenerative, cervical    Diverticulosis of colon    DJD (degenerative joint disease)    LS spine   Gallstones    History of colonic polyps    Hyperlipidemia    Hypertension    Osteoporosis     Medications: No anticoagulants PTA  Assessment: Pt is an 73 yoF with PMH significant for COPD, HTN, HLD, recurrent falls. CTA positive for bilateral PE. Pharmacy consulted to dose heparin.   Today, 07/17/23 Heparin level 0.47 - remains therapeutic on heparin 950 units/hr Hgb 9.7, plts 106--low, stable  SCr <1 No s/sx of bleeding reported  Goal of Therapy:  Heparin level 0.3-0.7 units/ml Monitor platelets by anticoagulation  protocol: Yes   Plan:  Continue heparin infusion at 950 units/hr CBC, heparin level daily Monitor for signs of bleeding   Roselee Cong, PharmD Clinical Pharmacist  5/18/20258:25 AM

## 2023-07-17 NOTE — Progress Notes (Signed)
 Called by nursing staff for patient restlessness and temp 100.7. Antibiotic stopped yesterday as urine culture was no growth and afebrile.  However, she has been getting fairly regular Tylenol . Will restart antibiotics and culture urine and blood.

## 2023-07-17 NOTE — Plan of Care (Signed)
  Problem: Safety: Goal: Ability to remain free from injury will improve Outcome: Progressing   Problem: Fluid Volume: Goal: Will show no signs and symptoms of excessive bleeding Outcome: Progressing   Problem: Clinical Measurements: Goal: Complications related to the disease process, condition or treatment will be avoided or minimized Outcome: Progressing

## 2023-07-17 NOTE — NC FL2 (Signed)
 [facility]  MEDICAID FL2 LEVEL OF CARE FORM     IDENTIFICATION  Patient Name: Megan Baldwin Birthdate: 11/19/1933 Sex: female Admission Date (Current Location): 07/14/2023  [place] [facility] and [place] Number:  Producer, television/film/video and Address:  [facility],  [address] [place], [place] [address]      Provider Number: [id number]  Attending Physician Name and Address:  Trenton Frock, MD  Relative Name and Phone Number:  Lynann, Demetrius (Daughter)  [phone] (Mobile)    Current Level of Care: Hospital Recommended Level of Care: Skilled Nursing Facility Prior Approval Number:    Date Approved/Denied:   PASRR Number: Pending  Discharge Plan: SNF    Current Diagnoses: Patient Active Problem List   Diagnosis Date Noted   Palliative care by specialist 07/17/2023   Acute metabolic encephalopathy 07/14/2023   Acute cystitis without hematuria 07/14/2023   Normocytic anemia 07/14/2023   Urinary frequency 06/22/2023   Bilateral hip joint arthritis 05/14/2023   Fall 05/14/2023   Gait disorder 05/14/2023   Vitamin D  deficiency 05/14/2023   Edema 05/14/2023   Memory changes 04/27/2022   Recurrent falls 04/27/2022   Rib pain on left side 04/27/2022   Primary osteoarthritis of left hip 04/22/2021   Weight loss 06/03/2020   B12 deficiency 06/03/2020   Depression 01/21/2019   Vertigo 04/19/2018   Hyperglycemia 09/26/2017   CKD (chronic kidney disease) stage 3, GFR 30-59 ml/min (HCC) 09/26/2017   Dizziness 05/03/2014   Bilateral hearing loss 12/19/2013   COPD exacerbation (HCC) 04/10/2013   Pain, joint, shoulder region, left 12/21/2012   Aortic valve regurgitation 10/12/2010   Encounter for well adult exam with abnormal findings 10/12/2010   COPD (chronic obstructive pulmonary disease) (HCC) 05/01/2009   SHOULDER PAIN, RIGHT 04/18/2009   NECK PAIN, RIGHT 04/18/2009   PARESTHESIA 08/09/2008   LUNG NODULE 08/02/2007   Hyperlipidemia 12/30/2006   Anxiety state  12/30/2006   Essential hypertension 12/30/2006   Allergic rhinitis 12/30/2006   Diverticulosis of colon 12/30/2006   Osteopenia 12/30/2006   History of colonic polyps 12/30/2006   Calculus of gallbladder 12/29/2006   OSTEOARTHRITIS, LUMBOSACRAL SPINE 12/29/2006   DEGENERATIVE DISC DISEASE, CERVICAL SPINE 12/29/2006   COMPUTERIZED TOMOGRAPHY, CHEST, ABNORMAL 12/29/2006    Orientation RESPIRATION BLADDER Height & Weight     Self, Place  Normal Incontinent Weight: 150 lb (68 kg) Height:  5\' 3"  (160 cm)  BEHAVIORAL SYMPTOMS/MOOD NEUROLOGICAL BOWEL NUTRITION STATUS      Continent Diet (regular)  AMBULATORY STATUS COMMUNICATION OF NEEDS Skin   Limited Assist Verbally Normal                       Personal Care Assistance Level of Assistance  Bathing, Feeding, Dressing Bathing Assistance: Limited assistance Feeding assistance: Independent Dressing Assistance: Limited assistance     Functional Limitations Info  Sight, Hearing, Speech Sight Info: Adequate Hearing Info: Adequate Speech Info: Adequate    SPECIAL CARE FACTORS FREQUENCY  PT (By licensed PT), OT (By licensed OT)     PT Frequency: 5 x a week OT Frequency: 5 x a week            Contractures Contractures Info: Not present    Additional Factors Info  Code Status, Allergies Code Status Info: full Allergies Info: NKA           Current Medications (07/17/2023):  This is the current hospital active medication list Current Facility-Administered Medications  Medication Dose Route Frequency Provider Last Rate Last Admin  acetaminophen  (TYLENOL ) tablet 1,000 mg  1,000 mg Oral TID Vita Grip, MD   1,000 mg at 07/16/23 2311   amLODipine  (NORVASC ) tablet 5 mg  5 mg Oral Daily Amponsah, Prosper M, MD   5 mg at 07/16/23 0905   diclofenac  Sodium (VOLTAREN ) 1 % topical gel 2 g  2 g Topical QID Vita Grip, MD   2 g at 07/16/23 1645   feeding supplement (ENSURE ENLIVE / ENSURE PLUS) liquid 237 mL   237 mL Oral BID BM Amponsah, Prosper M, MD   237 mL at 07/16/23 0904   heparin ADULT infusion 100 units/mL (25000 units/250mL)  950 Units/hr Intravenous Continuous Shireen Dory, RPH 9.5 mL/hr at 07/16/23 1308 950 Units/hr at 07/16/23 1308   ipratropium-albuterol  (DUONEB) 0.5-2.5 (3) MG/3ML nebulizer solution 3 mL  3 mL Nebulization Q6H PRN Amponsah, Prosper M, MD       nitroGLYCERIN (NITROSTAT) SL tablet 0.4 mg  0.4 mg Sublingual Q5 min PRN Chavez, Abigail, NP       ondansetron  (ZOFRAN ) tablet 4 mg  4 mg Oral Q6H PRN Amponsah, Prosper M, MD       Or   ondansetron  (ZOFRAN ) injection 4 mg  4 mg Intravenous Q6H PRN Amponsah, Prosper M, MD       Oral care mouth rinse  15 mL Mouth Rinse PRN Trenton Frock, MD       rosuvastatin  (CRESTOR ) tablet 10 mg  10 mg Oral Daily Chavez, Abigail, NP   10 mg at 07/16/23 0517   senna-docusate (Senokot-S) tablet 1 tablet  1 tablet Oral QHS PRN Amponsah, Prosper M, MD         Discharge Medications: Please see discharge summary for a list of discharge medications.  Relevant Imaging Results:  Relevant Lab Results:   Additional Information [id number]  Arta Lark Evangaline Jou, LCSW

## 2023-07-17 NOTE — Progress Notes (Signed)
 Progress Note  Patient Name: Megan Baldwin Date of Encounter: 07/17/2023  Primary Cardiologist: None   Subjective   Feels very well. Has no recollection of her chest pain episode.  Inpatient Medications    Scheduled Meds:  acetaminophen   1,000 mg Oral TID   amLODipine   5 mg Oral Daily   diclofenac  Sodium  2 g Topical QID   feeding supplement  237 mL Oral BID BM   rosuvastatin   10 mg Oral Daily   Continuous Infusions:  heparin 950 Units/hr (07/16/23 1308)   PRN Meds: ipratropium-albuterol , nitroGLYCERIN, ondansetron  **OR** ondansetron  (ZOFRAN ) IV, mouth rinse, senna-docusate   Vital Signs    Vitals:   07/16/23 0254 07/16/23 1234 07/16/23 2001 07/17/23 0437  BP: (!) 152/78 134/81 128/83 (!) 120/99  Pulse: 77 83 90 87  Resp: 18     Temp: 99.3 F (37.4 C) 97.8 F (36.6 C) 98.3 F (36.8 C) 98.7 F (37.1 C)  TempSrc: Oral Oral Oral Oral  SpO2: 100% 100% 100% 98%  Weight:      Height:        Intake/Output Summary (Last 24 hours) at 07/17/2023 0855 Last data filed at 07/17/2023 0640 Gross per 24 hour  Intake 215.49 ml  Output 400 ml  Net -184.51 ml   Filed Weights   07/14/23 1614  Weight: 68 kg    Telemetry    Sinus rhythm - Personally Reviewed  ECG    No new   Physical Exam   GEN: No acute distress.   Neck: No JVD Cardiac: RRR, no murmurs, rubs, or gallops.  Respiratory: Clear to auscultation bilaterally. GI: Soft, nontender, non-distended  MS: No edema; No deformity. Neuro:  Nonfocal  Psych: Normal affect   Labs    Chemistry Recent Labs  Lab 07/14/23 1648 07/14/23 1704 07/16/23 0107 07/16/23 1409 07/17/23 0705  NA 134*   < > 132* 135 135  K 4.0   < > 3.7 4.2 3.9  CL 103   < > 102 107 106  CO2 21*   < > 20* 18* 22  GLUCOSE 83   < > 107* 120* 99  BUN 21   < > 17 16 15   CREATININE 1.01*   < > 0.93 0.95 0.86  CALCIUM  9.2   < > 8.8* 8.6* 8.5*  PROT 7.5  --   --   --   --   ALBUMIN 3.6  --   --   --   --   AST 28  --   --   --   --    ALT 17  --   --   --   --   ALKPHOS 65  --   --   --   --   BILITOT 1.0  --   --   --   --   GFRNONAA 54*   < > 59* 58* >60  ANIONGAP 10   < > 10 10 7    < > = values in this interval not displayed.     Hematology Recent Labs  Lab 07/16/23 0107 07/16/23 1409 07/17/23 0705  WBC 6.0 7.0 8.7  RBC 3.37* 3.15* 3.27*  HGB 10.1* 9.5* 9.7*  HCT 31.7* 31.1* 31.7*  MCV 94.1 98.7 96.9  MCH 30.0 30.2 29.7  MCHC 31.9 30.5 30.6  RDW 15.9* 16.3* 16.2*  PLT 92* 96* 106*    Cardiac EnzymesNo results for input(s): "TROPONINI" in the last 168 hours. No results for input(s): "TROPIPOC" in the last  168 hours.   BNP Recent Labs  Lab 07/16/23 0107  BNP 139.4*     DDimer  Recent Labs  Lab 07/16/23 0242 07/16/23 0609  DDIMER 18.98* 18.87*     Summary of Pertinent studies    TTE: ordered   Labs: reviewed  Patient Profile     89 y.o. female  with a history of hypertension, hyperlipidemia, COPD, mild cognitive impairment who is being seen today for the evaluation of troponin elevation at the request of Dr. Betsey Brow.   Assessment & Plan    Troponin elevation, chest pain - Acute PE Troponin is slightly elevated and in a fairly flat trajectory CT PE returned positive for PE I think it reasonable to update TTE Continue rosuvastatin    Acute cystitis Managed with Rocephin       For questions or updates, please contact [facility] Please consult [email or web address] for contact info under Cardiology/STEMI.      Signed, Efraim Grange, MD 07/17/2023, 8:55 AM

## 2023-07-17 NOTE — Progress Notes (Signed)
 PROGRESS NOTE  Megan Baldwin  WJX:[id number] DOB: Apr 12, 1933 DOA: 07/14/2023 PCP: Roslyn Coombe, MD  Consultants  Brief Narrative: 89 y.o. female with medical history significant for anxiety, COPD, mild cognitive impairment, vitamin B12 deficiency, osteoarthritis, HLD, HTN, DDD, and osteoporosis who presented to EMS for evaluation of altered mental status.  Per daughter, patient's friend came by her house and noticed that patient was on the ground. Patient has history of recurrent falls and has fallen about 3 times in the past 2 months (daughter thinks this is likely due to patient not using her walker appropriately). Patient has been complaining of some left leg pain and over the last year, has had a decline in her cognition. Brought to ED for same, found to have UTI.  Admitted for the same.    Assessment & Plan: Acute metabolic encephalopathy - Initially presumed in the setting of UTI with underlying mild cognitive impairment and memory loss, but now cultures negative, so less likely UTI as culprit. - Seems like she is rather having acceleration of the decline of her baseline cognition.  Believe she is at least mild if not moderate dementia. -Will start Aricept here.  Also want to maximize B12 stores in light of notable B12 deficiency, see below - Recommend outpatient neurology follow-up and discussed this with daughter  PE: - No chest pain, no respiratory/cardiopulmonary symptoms. - Switching to Eliquis today. - Bilateral Dopplers revealed DVTs bilaterally today.  Daughter not surprised by this as she has had lower extremity swelling for a couple of weeks prior to admission.  Also had episode of chest pain about a week prior to admission according to family with some shortness of breath.  Resolved, so they didn't bring her to the ER. - Echo repeated this admit, no evidence of heart strain, EF good.     # Acute cystitis, working diagnosis thus far: - Presented with altered mental status and  concern for infection on U/A.  Since admit, though, she's been afebrile, with no leukocytosis - culture is no growth, making UTI much less likely.   - Antibiotics stopped 5/17 and no change in subjective or objective findings so we will continue to hold.   # Normocytic anemia - Hgb stabilized at 10.1   - Has severe vitamin B12 deficiency  # Thrombocytopenia: Fortunately uptrending.  106 today, 69 on admission.   # HTN - BP stable with SBP in the 130s to 140s - Patient tried amlodipine , spironolactone  and labetalol , PCP suspects medication noncompliance - Resume low-dose amlodipine  for now, can add other BP meds as needed   # Vitamin B12 deficiency - Vitamin B12 level significantly low at 73 2 months ago - This is likely contributing to patient's cognitive deficits and short term memory loss - Start IM vitamin B12 1000 mcg daily injections x3 doses while inpatient - Resume oral supplementation at discharge   # Recurrent falls # Bilateral hip osteoarthritis - Per daughter, patient has history of falls and has fallen about 3 times in the past 2 months - X-ray of the hips shows advanced osteoarthritis bilaterally but no acute bony abnormality - as noted above, it's unclear how long she was down at home with most recent fall - Pain control with scheduled Tylenol  and Voltaren  gel - PT/OT eval and treat - Fall precautions   # BLE edema - Has 1+ BLE edema at baseline, improving since admit, thought due to venous insufficiency by her PCP - longstanding, Doppler ultrasounds revealed bilateral DVTs.  Already on  Eliquis.   # COPD - Stable -As needed DuoNebs   # Anxiety - Stable     DVT prophylaxis:  Place TED hose Start: 07/14/23 2037 Place and maintain sequential compression device Start: 07/14/23 1911  Code Status:   Code Status: Full Code Family Communication: called daughter Level of care: Med-Surg Status is: Inpatient Dispo:  pending resolution of UTI   Consults called:  cardiology   Subjective: Patient awake and alert this AM.  Knows she's in the hospital, remembers me from yesterday.  Eating breakfast, no chest pain/SOB/palpitations.    Objective: Vitals:   07/16/23 1234 07/16/23 2001 07/17/23 0437 07/17/23 1324  BP: 134/81 128/83 (!) 120/99 (!) 145/72  Pulse: 83 90 87 81  Resp:    16  Temp: 97.8 F (36.6 C) 98.3 F (36.8 C) 98.7 F (37.1 C) 99.3 F (37.4 C)  TempSrc: Oral Oral Oral Oral  SpO2: 100% 100% 98% 100%  Weight:      Height:        Intake/Output Summary (Last 24 hours) at 07/17/2023 1502 Last data filed at 07/17/2023 1407 Gross per 24 hour  Intake 480 ml  Output 400 ml  Net 80 ml   Filed Weights   07/14/23 1614  Weight: 68 kg   Body mass index is 26.57 kg/m.  Gen: 89 y.o. female in no apparent distress.  Nontoxic Pulm: Non-labored breathing.  Clear to auscultation bilaterally.  CV: Regular rate and rhythm.  GI: Abdomen soft, non-tender, non-distended, with normoactive bowel sounds. No organomegaly or masses felt. Ext: Warm, no deformities, trace pedal edema Skin: No rashes, lesions  Neuro: Alert, oriented to person and hospital, not which one, not year.  Moving all limbs symmetrically.  Able to recognize her daughter Psych: Calm. Mood & affect appropriate.     I have personally reviewed the following labs and images: CBC: Recent Labs  Lab 07/14/23 1648 07/14/23 1704 07/15/23 0353 07/16/23 0107 07/16/23 1409 07/17/23 0705  WBC 5.4  --  4.2 6.0 7.0 8.7  NEUTROABS 3.4  --   --   --   --   --   HGB 9.4* 10.2* 9.2* 10.1* 9.5* 9.7*  HCT 29.2* 30.0* 28.5* 31.7* 31.1* 31.7*  MCV 93.6  --  93.4 94.1 98.7 96.9  PLT 69*  --  71* 92* 96* 106*   BMP &GFR Recent Labs  Lab 07/14/23 1648 07/14/23 1704 07/15/23 0353 07/16/23 0107 07/16/23 1409 07/17/23 0705  NA 134* 135 135 132* 135 135  K 4.0 4.1 3.7 3.7 4.2 3.9  CL 103 105 106 102 107 106  CO2 21*  --  19* 20* 18* 22  GLUCOSE 83 78 72 107* 120* 99  BUN 21 20 17  17 16 15   CREATININE 1.01* 1.00 0.88 0.93 0.95 0.86  CALCIUM  9.2  --  8.4* 8.8* 8.6* 8.5*   Estimated Creatinine Clearance: 41.8 mL/min (by C-G formula based on SCr of 0.86 mg/dL). Liver & Pancreas: Recent Labs  Lab 07/14/23 1648  AST 28  ALT 17  ALKPHOS 65  BILITOT 1.0  PROT 7.5  ALBUMIN 3.6   No results for input(s): "LIPASE", "AMYLASE" in the last 168 hours. Recent Labs  Lab 07/14/23 1648  AMMONIA <13   Diabetic: No results for input(s): "HGBA1C" in the last 72 hours. No results for input(s): "GLUCAP" in the last 168 hours. Cardiac Enzymes: No results for input(s): "CKTOTAL", "CKMB", "CKMBINDEX", "TROPONINI" in the last 168 hours. No results for input(s): "PROBNP" in the last  8760 hours. Coagulation Profile: Recent Labs  Lab 07/16/23 0609  INR 1.4*   Thyroid  Function Tests: Recent Labs    07/14/23 1648  TSH 0.901   Lipid Profile: Recent Labs    07/16/23 0451  CHOL 176  HDL 55  LDLCALC 104*  TRIG 85  CHOLHDL 3.2   Anemia Panel: Recent Labs    07/15/23 0353  FOLATE 6.0  FERRITIN 166  TIBC 269  IRON 36   Urine analysis:    Component Value Date/Time   COLORURINE YELLOW 07/14/2023 1642   APPEARANCEUR CLEAR 07/14/2023 1642   LABSPEC 1.014 07/14/2023 1642   PHURINE 6.0 07/14/2023 1642   GLUCOSEU NEGATIVE 07/14/2023 1642   GLUCOSEU NEGATIVE 05/13/2023 1439   HGBUR NEGATIVE 07/14/2023 1642   BILIRUBINUR NEGATIVE 07/14/2023 1642   KETONESUR NEGATIVE 07/14/2023 1642   PROTEINUR NEGATIVE 07/14/2023 1642   UROBILINOGEN 1.0 05/13/2023 1439   NITRITE NEGATIVE 07/14/2023 1642   LEUKOCYTESUR MODERATE (A) 07/14/2023 1642   Sepsis Labs: Invalid input(s): "PROCALCITONIN", "LACTICIDVEN"  Microbiology: Recent Results (from the past 240 hours)  Urine Culture     Status: None   Collection Time: 07/14/23  4:42 PM   Specimen: Urine, Random  Result Value Ref Range Status   Specimen Description   Final    URINE, RANDOM Performed at [facility], [address], [place], [place] [address]    Special Requests   Final    NONE Reflexed from [id number] Performed at [facility], [address] [place], [place] [address]    Culture   Final    NO GROWTH Performed at [facility], [address], [place], [place] [address]    Report Status 07/15/2023 FINAL  Final    Radiology Studies: VAS US  LOWER EXTREMITY VENOUS (DVT) Result Date: 07/17/2023  Lower Venous DVT Study Patient Name:  Megan Baldwin  Date of Exam:   07/17/2023 Medical Rec #: [id number]        Accession #:    [id number] Date of Birth: 1933-10-27         Patient Gender: F Patient Age:   41 years Exam Location:  [facility] Procedure:      VAS US  LOWER EXTREMITY VENOUS (DVT) Referring Phys: ABIGAIL CHAVEZ --------------------------------------------------------------------------------  Indications: Elevated D-dimer (18.87).  Limitations: Poor ultrasound/tissue interface and patient movement (restlessness). Comparison Study: No previous exams Performing Technologist: Jody Hill RVT, RDMS  Examination Guidelines: A complete evaluation includes B-mode imaging, spectral Doppler, color Doppler, and power Doppler as needed of all accessible portions of each vessel. Bilateral testing is considered an integral part of a complete examination. Limited examinations for reoccurring indications may be performed as noted. The reflux portion of the exam is performed with the patient in reverse Trendelenburg.  +---------+---------------+---------+-----------+----------+--------------+ RIGHT    CompressibilityPhasicitySpontaneityPropertiesThrombus Aging +---------+---------------+---------+-----------+----------+--------------+ CFV      Full           Yes      Yes                                 +---------+---------------+---------+-----------+----------+--------------+ SFJ      Full                                                         +---------+---------------+---------+-----------+----------+--------------+  FV Prox  Full           Yes      Yes                                 +---------+---------------+---------+-----------+----------+--------------+ FV Mid   Full           Yes      Yes                                 +---------+---------------+---------+-----------+----------+--------------+ FV DistalNone           No       No                   Acute          +---------+---------------+---------+-----------+----------+--------------+ PFV      Full           Yes      Yes                                 +---------+---------------+---------+-----------+----------+--------------+ POP      None           No       No                   Acute          +---------+---------------+---------+-----------+----------+--------------+ PTV      None           No       No                   Acute          +---------+---------------+---------+-----------+----------+--------------+ PERO     None           No       No                   Acute          +---------+---------------+---------+-----------+----------+--------------+   +---------+---------------+---------+-----------+----------+-----------------+ LEFT     CompressibilityPhasicitySpontaneityPropertiesThrombus Aging    +---------+---------------+---------+-----------+----------+-----------------+ CFV      Full           Yes      Yes                                    +---------+---------------+---------+-----------+----------+-----------------+ SFJ      Partial                                      Age Indeterminate +---------+---------------+---------+-----------+----------+-----------------+ FV Prox  Partial        Yes      Yes                  Age Indeterminate +---------+---------------+---------+-----------+----------+-----------------+ FV Mid   Partial        Yes      Yes                  Age Indeterminate  +---------+---------------+---------+-----------+----------+-----------------+ FV DistalFull           Yes      Yes                                    +---------+---------------+---------+-----------+----------+-----------------+  PFV      Partial        No       Yes                  Age Indeterminate +---------+---------------+---------+-----------+----------+-----------------+ POP      Full           Yes      Yes                                    +---------+---------------+---------+-----------+----------+-----------------+ PTV      Full                                                           +---------+---------------+---------+-----------+----------+-----------------+ PERO     Full                                                           +---------+---------------+---------+-----------+----------+-----------------+    Summary: BILATERAL: -No evidence of popliteal cyst, bilaterally. RIGHT: - Findings consistent with acute deep vein thrombosis involving the right femoral vein, right popliteal vein, right posterior tibial veins, and right peroneal veins.   LEFT: - Findings consistent with age indeterminate deep vein thrombosis involving the SF junction, left femoral vein, and left proximal profunda vein.   *See table(s) above for measurements and observations.    Preliminary    ECHOCARDIOGRAM COMPLETE Result Date: 07/17/2023    ECHOCARDIOGRAM REPORT   Patient Name:   Megan Baldwin Date of Exam: 07/17/2023 Medical Rec #:  [id number]       Height:       63.0 in Accession #:    [id number]      Weight:       150.0 lb Date of Birth:  [birth year]        BSA:          1.711 m Patient Age:    89 years        BP:           120/99 mmHg Patient Gender: F               HR:           85 bpm. Exam Location:  Inpatient Procedure: 2D Echo, 3D Echo, Color Doppler, Cardiac Doppler and Strain Analysis            (Both Spectral and Color Flow Doppler were utilized during            procedure).  Indications:    Pulmonary Embolus  History:        Patient has prior history of Echocardiogram examinations, most                 recent 10/23/2010. Risk Factors:Hypertension and Dyslipidemia.                 Chronic Kidney Diseas.  Sonographer:    Travis Friedman RDCS Referring Phys: [id number] AUGUSTUS E MEALOR IMPRESSIONS  1. Left ventricular ejection fraction, by estimation, is 60 to 65%. Left ventricular ejection fraction by 3D volume is  61 %. The left ventricle has normal function. The left ventricle has no regional wall motion abnormalities. There is mild left ventricular hypertrophy of the basal-septal segment. Left ventricular diastolic parameters are consistent with Grade I diastolic dysfunction (impaired relaxation). The average left ventricular global longitudinal strain is -18.1 %. The global longitudinal strain is normal.  2. Right ventricular systolic function is normal. The right ventricular size is normal. There is normal pulmonary artery systolic pressure. The estimated right ventricular systolic pressure is 31.9 mmHg.  3. Left atrial size was severely dilated.  4. The mitral valve is degenerative. No evidence of mitral valve regurgitation. Mild mitral stenosis. The mean mitral valve gradient is 4.0 mmHg. Severe mitral annular calcification.  5. The aortic valve is tricuspid. There is moderate calcification of the aortic valve. Aortic valve regurgitation is mild to moderate. No aortic stenosis is present. Aortic valve mean gradient measures 7.0 mmHg. Aortic valve Vmax measures 1.71 m/s.  6. The inferior vena cava is normal in size with greater than 50% respiratory variability, suggesting right atrial pressure of 3 mmHg. FINDINGS  Left Ventricle: Left ventricular ejection fraction, by estimation, is 60 to 65%. Left ventricular ejection fraction by 3D volume is 61 %. The left ventricle has normal function. The left ventricle has no regional wall motion abnormalities. The average left ventricular global  longitudinal strain is -18.1 %. Strain was performed and the global longitudinal strain is normal. The left ventricular internal cavity size was normal in size. There is mild left ventricular hypertrophy of the basal-septal segment. Left ventricular diastolic parameters are consistent with Grade I diastolic dysfunction (impaired relaxation). Right Ventricle: The right ventricular size is normal. No increase in right ventricular wall thickness. Right ventricular systolic function is normal. There is normal pulmonary artery systolic pressure. The tricuspid regurgitant velocity is 2.69 m/s, and  with an assumed right atrial pressure of 3 mmHg, the estimated right ventricular systolic pressure is 31.9 mmHg. Left Atrium: Left atrial size was severely dilated. Right Atrium: Right atrial size was normal in size. Pericardium: There is no evidence of pericardial effusion. Mitral Valve: The mitral valve is degenerative in appearance. There is moderate thickening of the anterior mitral valve leaflet(s). There is moderate calcification of the anterior mitral valve leaflet(s). Mildly decreased mobility of the mitral valve leaflets. Severe mitral annular calcification. No evidence of mitral valve regurgitation. Mild mitral valve stenosis. MV peak gradient, 9.7 mmHg. The mean mitral valve gradient is 4.0 mmHg. Tricuspid Valve: The tricuspid valve is normal in structure. Tricuspid valve regurgitation is mild . No evidence of tricuspid stenosis. Aortic Valve: The aortic valve is tricuspid. There is moderate calcification of the aortic valve. Aortic valve regurgitation is mild to moderate. Aortic regurgitation PHT measures 372 msec. No aortic stenosis is present. Aortic valve mean gradient measures 7.0 mmHg. Aortic valve peak gradient measures 11.7 mmHg. Aortic valve area, by VTI measures 3.37 cm. Pulmonic Valve: The pulmonic valve was normal in structure. Pulmonic valve regurgitation is not visualized. No evidence of pulmonic  stenosis. Aorta: The aortic root is normal in size and structure. Venous: The inferior vena cava is normal in size with greater than 50% respiratory variability, suggesting right atrial pressure of 3 mmHg. IAS/Shunts: No atrial level shunt detected by color flow Doppler. Additional Comments: 3D was performed not requiring image post processing on an independent workstation and was normal.  LEFT VENTRICLE PLAX 2D LVIDd:         3.60 cm  Diastology LVIDs:         2.00 cm         LV e' medial:    0.05 cm/s LV PW:         1.20 cm         LV E/e' medial:  24.1 LV IVS:        1.20 cm         LV e' lateral:   0.06 cm/s LVOT diam:     2.10 cm         LV E/e' lateral: 19.8 LV SV:         109 LV SV Index:   64              2D Longitudinal LVOT Area:     3.46 cm        Strain                                2D Strain GLS   -18.1 %                                Avg:                                 3D Volume EF                                LV 3D EF:    Left                                             ventricul                                             ar                                             ejection                                             fraction                                             by 3D                                             volume is  61 %.                                 3D Volume EF:                                3D EF:        61 %                                LV EDV:       136 ml                                LV ESV:       54 ml                                LV SV:        82 ml RIGHT VENTRICLE             IVC RV Basal diam:  3.70 cm     IVC diam: 1.20 cm RV S prime:     11.80 cm/s TAPSE (M-mode): 2.5 cm LEFT ATRIUM            Index        RIGHT ATRIUM           Index LA diam:      3.90 cm  2.28 cm/m   RA Area:     16.40 cm LA Vol (A2C): 36.7 ml  21.45 ml/m  RA Volume:   42.20 ml  24.66 ml/m LA Vol (A4C): 106.0 ml 61.95 ml/m  AORTIC  VALVE AV Area (Vmax):    3.67 cm AV Area (Vmean):   3.41 cm AV Area (VTI):     3.37 cm AV Vmax:           171.00 cm/s AV Vmean:          122.000 cm/s AV VTI:            0.323 m AV Peak Grad:      11.7 mmHg AV Mean Grad:      7.0 mmHg LVOT Vmax:         181.00 cm/s LVOT Vmean:        120.000 cm/s LVOT VTI:          0.314 m LVOT/AV VTI ratio: 0.97 AI PHT:            372 msec  AORTA Ao Asc diam: 3.20 cm MITRAL VALVE                TRICUSPID VALVE MV Area (PHT): 3.34 cm     TR Peak grad:   28.9 mmHg MV Area VTI:   2.10 cm     TR Vmax:        269.00 cm/s MV Peak grad:  9.7 mmHg MV Mean grad:  4.0 mmHg     SHUNTS MV Vmax:       1.56 m/s     Systemic VTI:  0.31 m MV Vmean:      90.1 cm/s    Systemic Diam: 2.10 cm MV Decel Time: 227 msec MV E velocity: 1.10 cm/s MV A velocity:  167.00 cm/s MV E/A ratio:  0.01 Megan Gathers MD Electronically signed by Megan Gathers MD Signature Date/Time: 07/17/2023/12:08:33 PM    Final     Scheduled Meds:  acetaminophen   1,000 mg Oral TID   amLODipine   5 mg Oral Daily   diclofenac  Sodium  2 g Topical QID   feeding supplement  237 mL Oral BID BM   rosuvastatin   10 mg Oral Daily   Continuous Infusions:  heparin 950 Units/hr (07/17/23 1233)     LOS: 3 days   35 minutes with more than 50% spent in reviewing records, counseling patient/family and coordinating care.  Trenton Frock, MD [facility] [email or web address] 07/17/2023, 3:02 PM

## 2023-07-17 NOTE — Progress Notes (Signed)
 BLE venous duplex has been completed.  Preliminary findings given to Dr. Betsey Brow.   Results can be found under chart review under CV PROC. 07/17/2023 1:46 PM Sosha Shepherd RVT, RDMS

## 2023-07-17 NOTE — Progress Notes (Signed)
 Daily Progress Note   Patient Name: Megan Baldwin       Date: 07/17/2023 DOB: [birth year]  Age: 89 y.o. MRN#: [id number] Attending Physician: Trenton Frock, MD Primary Care Physician: Roslyn Coombe, MD Admit Date: 07/14/2023  Reason for Consultation/Follow-up: Establishing goals of care  Subjective: No acute distress.   Length of Stay: 3  Current Medications: Scheduled Meds:   acetaminophen   1,000 mg Oral TID   amLODipine   5 mg Oral Daily   diclofenac  Sodium  2 g Topical QID   feeding supplement  237 mL Oral BID BM   rosuvastatin   10 mg Oral Daily    Continuous Infusions:  heparin 950 Units/hr (07/16/23 1308)    PRN Meds: ipratropium-albuterol , nitroGLYCERIN, ondansetron  **OR** ondansetron  (ZOFRAN ) IV, mouth rinse, senna-docusate  Physical Exam         No acute distress Resting comfortably No edema  Vital Signs: BP (!) 120/99 (BP Location: Right Arm)   Pulse 87   Temp 98.7 F (37.1 C) (Oral)   Resp 18   Ht 5\' 3"  (1.6 m)   Wt 68 kg   SpO2 98%   BMI 26.57 kg/m  SpO2: SpO2: 98 % O2 Device: O2 Device: Room Air O2 Flow Rate:    Intake/output summary:  Intake/Output Summary (Last 24 hours) at 07/17/2023 1219 Last data filed at 07/17/2023 1032 Gross per 24 hour  Intake 395.49 ml  Output 400 ml  Net -4.51 ml   LBM: Last BM Date : 07/17/23 Baseline Weight: Weight: 68 kg Most recent weight: Weight: 68 kg       Palliative Assessment/Data:      Patient Active Problem List   Diagnosis Date Noted   Palliative care by specialist 07/17/2023   Acute metabolic encephalopathy 07/14/2023   Acute cystitis without hematuria 07/14/2023   Normocytic anemia 07/14/2023   Urinary frequency 06/22/2023   Bilateral hip joint arthritis 05/14/2023   Fall 05/14/2023    Gait disorder 05/14/2023   Vitamin D  deficiency 05/14/2023   Edema 05/14/2023   Memory changes 04/27/2022   Recurrent falls 04/27/2022   Rib pain on left side 04/27/2022   Primary osteoarthritis of left hip 04/22/2021   Weight loss 06/03/2020   B12 deficiency 06/03/2020   Depression 01/21/2019   Vertigo 04/19/2018   Hyperglycemia 09/26/2017   CKD (chronic  kidney disease) stage 3, GFR 30-59 ml/min (HCC) 09/26/2017   Dizziness 05/03/2014   Bilateral hearing loss 12/19/2013   COPD exacerbation (HCC) 04/10/2013   Pain, joint, shoulder region, left 12/21/2012   Aortic valve regurgitation 10/12/2010   Encounter for well adult exam with abnormal findings 10/12/2010   COPD (chronic obstructive pulmonary disease) (HCC) 05/01/2009   SHOULDER PAIN, RIGHT 04/18/2009   NECK PAIN, RIGHT 04/18/2009   PARESTHESIA 08/09/2008   LUNG NODULE 08/02/2007   Hyperlipidemia 12/30/2006   Anxiety state 12/30/2006   Essential hypertension 12/30/2006   Allergic rhinitis 12/30/2006   Diverticulosis of colon 12/30/2006   Osteopenia 12/30/2006   History of colonic polyps 12/30/2006   Calculus of gallbladder 12/29/2006   OSTEOARTHRITIS, LUMBOSACRAL SPINE 12/29/2006   DEGENERATIVE DISC DISEASE, CERVICAL SPINE 12/29/2006   COMPUTERIZED TOMOGRAPHY, CHEST, ABNORMAL 12/29/2006    Palliative Care Assessment & Plan   Patient Profile:    Assessment:  89 y.o. female  with a history of hypertension, hyperlipidemia, COPD, mild cognitive impairment  PE   Recommendations/Plan:  Full code for now Recommend SNF rehab with palliative on discharge. ECHO done results pending, monitor PO intake, PT participation and hospital course for now.     Code Status:    Code Status Orders  (From admission, onward)           Start     Ordered   07/14/23 1901  Full code  Continuous       Question:  By:  Answer:  Consent: discussion documented in EHR   07/14/23 1902           Code Status History     This  patient has a current code status but no historical code status.      Advance Directive Documentation    Flowsheet Row Most Recent Value  Type of Advance Directive Healthcare Power of Attorney  Pre-existing out of facility DNR order (yellow form or pink MOST form) --  "MOST" Form in Place? --       Prognosis:  Unable to determine  Discharge Planning: Skilled Nursing Facility for rehab with Palliative care service follow-up  Care plan was discussed with IDT  Thank you for allowing the Palliative Medicine Team to assist in the care of this patient.  Low MDM.      Greater than 50%  of this time was spent counseling and coordinating care related to the above assessment and plan.  Lujean Sake, MD  Please contact Palliative Medicine Team phone at [phone] for questions and concerns.

## 2023-07-18 DIAGNOSIS — G9341 Metabolic encephalopathy: Secondary | ICD-10-CM | POA: Diagnosis not present

## 2023-07-18 DIAGNOSIS — I2699 Other pulmonary embolism without acute cor pulmonale: Secondary | ICD-10-CM | POA: Diagnosis not present

## 2023-07-18 DIAGNOSIS — R7989 Other specified abnormal findings of blood chemistry: Secondary | ICD-10-CM

## 2023-07-18 LAB — BASIC METABOLIC PANEL WITH GFR
Anion gap: 10 (ref 5–15)
BUN: 12 mg/dL (ref 8–23)
CO2: 19 mmol/L — ABNORMAL LOW (ref 22–32)
Calcium: 8.2 mg/dL — ABNORMAL LOW (ref 8.9–10.3)
Chloride: 103 mmol/L (ref 98–111)
Creatinine, Ser: 0.84 mg/dL (ref 0.44–1.00)
GFR, Estimated: >60 mL/min (ref >60)
Glucose, Bld: 95 mg/dL (ref 70–99)
Potassium: 3.7 mmol/L (ref 3.5–5.1)
Sodium: 132 mmol/L — ABNORMAL LOW (ref 135–145)

## 2023-07-18 LAB — CBC
HCT: 28 % — ABNORMAL LOW (ref 36.0–46.0)
Hemoglobin: 9 g/dL — ABNORMAL LOW (ref 12.0–15.0)
MCH: 30.2 pg (ref 26.0–34.0)
MCHC: 32.1 g/dL (ref 30.0–36.0)
MCV: 94 fL (ref 80.0–100.0)
Platelets: 113 10*3/uL — ABNORMAL LOW (ref 150–400)
RBC: 2.98 MIL/uL — ABNORMAL LOW (ref 3.87–5.11)
RDW: 16.1 % — ABNORMAL HIGH (ref 11.5–15.5)
WBC: 12.3 10*3/uL — ABNORMAL HIGH (ref 4.0–10.5)
nRBC: 0 % (ref 0.0–0.2)

## 2023-07-18 LAB — VITAMIN B12: Vitamin B-12: 1205 pg/mL — ABNORMAL HIGH (ref 180–914)

## 2023-07-18 MED ORDER — SODIUM CHLORIDE 0.9 % IV SOLN
500.0000 mg | Freq: Every day | INTRAVENOUS | Status: DC
  Administered 2023-07-18 – 2023-07-19 (×2): 500 mg via INTRAVENOUS
  Filled 2023-07-18 (×3): qty 5

## 2023-07-18 MED ORDER — CYANOCOBALAMIN 1000 MCG/ML IJ SOLN
1000.0000 ug | Freq: Every day | INTRAMUSCULAR | Status: DC
  Administered 2023-07-18 – 2023-07-19 (×2): 1000 ug via INTRAMUSCULAR
  Filled 2023-07-18 (×2): qty 1

## 2023-07-18 NOTE — Plan of Care (Signed)
 Discussed with the patient plan of care for the evening, pain management and bedtime medications with some teach back displayed.  Problem: Education: Goal: Knowledge of General Education information will improve Description: Including pain rating scale, medication(s)/side effects and non-pharmacologic comfort measures Outcome: Progressing   Problem: Health Behavior/Discharge Planning: Goal: Ability to manage health-related needs will improve Outcome: Progressing   Problem: Pain Managment: Goal: General experience of comfort will improve and/or be controlled Outcome: Progressing

## 2023-07-18 NOTE — TOC Progression Note (Signed)
 Transition of Care [facility]) - Progression Note    Patient Details  Name: Megan Baldwin MRN: [id number] Date of Birth: 03-31-1933  [facility] CM/SW Contact  Gertha Ku, LCSW Phone Number: 07/18/2023, 3:26 PM  Clinical Narrative:    CSW presented bed offers to pt and pt's daughter [name]. Pt's daughter is requesting time to review. TOC to follow.     Expected Discharge Plan:  (TBD) Barriers to Discharge: Continued Medical Work up  Expected Discharge Plan and Services                                               Social Determinants of Health (SDOH) Interventions SDOH Screenings   Food Insecurity: Patient Unable To Answer (07/14/2023)  Housing: Unknown (07/08/2023)  Transportation Needs: No Transportation Needs (07/08/2023)  Utilities: Not At Risk (07/08/2023)  Depression (PHQ2-9): Low Risk  (07/08/2023)  Tobacco Use: Medium Risk (07/14/2023)    Readmission Risk Interventions     No data to display

## 2023-07-18 NOTE — Plan of Care (Signed)

## 2023-07-18 NOTE — Plan of Care (Signed)

## 2023-07-18 NOTE — Progress Notes (Signed)
 Physical Therapy Treatment Patient Details Name: Megan Baldwin MRN: [id number] DOB: 22-Jan-1934 Today's Date: 07/18/2023   History of Present Illness 89 yo female admitted with acute metabolic encephalopathy, cystitis, acute PE, bil DVTs. PMH: anxiety, COPD, mild cognitive impairment, vitamin B12 deficiency, osteoarthritis, HLD, HTN, DDD, and osteoporosis.    PT Comments  Pt agreeable to working with therapy. She continues to have confusion. She is cooperative and participated well. Assisted pt over to bsc-pt able to indicate that she needed to use bathroom-she did not remember that she has a purewick on. Total A for toileting hygiene, Mod A for pulling depends up. Pt then walked a short distance from bsc to recliner. Dyspnea 2/4 but overall she tolerated session well. Patient will benefit from continued inpatient follow up therapy, <3 hours/day     If plan is discharge home, recommend the following: A little help with walking and/or transfers;A little help with bathing/dressing/bathroom;Assistance with cooking/housework;Assist for transportation;Help with stairs or ramp for entrance   Can travel by private vehicle        Equipment Recommendations  None recommended by PT    Recommendations for Other Services       Precautions / Restrictions Precautions Precautions: Fall Restrictions Weight Bearing Restrictions Per Provider Order: No     Mobility  Bed Mobility Overal bed mobility: Needs Assistance Bed Mobility: Supine to Sit     Supine to sit: Contact guard, HOB elevated, Used rails     General bed mobility comments: increased time. heavy reliance on UEs, bedaril. cues provided.    Transfers Overall transfer level: Needs assistance Equipment used: Rolling walker (2 wheels) Transfers: Sit to/from Stand Sit to Stand: Min assist   Step pivot transfers: Min assist       General transfer comment: cues for hand placement. assist to rise, steady, control descent. stand pivot  using rw to bsc.    Ambulation/Gait Ambulation/Gait assistance: Min assist Gait Distance (Feet): 5 Feet Assistive device: Rolling walker (2 wheels) Gait Pattern/deviations: Step-through pattern, Decreased stride length       General Gait Details: cues for safety, direction. assist to stabilize pt and manage with RW. dyspnea 2/4.   Stairs             Wheelchair Mobility     Tilt Bed    Modified Rankin (Stroke Patients Only)       Balance Overall balance assessment: Needs assistance         Standing balance support: Bilateral upper extremity supported, Reliant on assistive device for balance, During functional activity Standing balance-Leahy Scale: Poor                              Communication Communication Communication: No apparent difficulties  Cognition Arousal: Alert Behavior During Therapy: WFL for tasks assessed/performed   PT - Cognitive impairments: No family/caregiver present to determine baseline, Memory, Problem solving, Orientation   Orientation impairments: Place, Time, Situation                   PT - Cognition Comments: pleasant. cooperative. Following commands: Impaired Following commands impaired: Follows one step commands with increased time    Cueing Cueing Techniques: Verbal cues, Tactile cues  Exercises      General Comments        Pertinent Vitals/Pain Pain Assessment Pain Assessment: No/denies pain    Home Living  Prior Function            PT Goals (current goals can now be found in the care plan section) Progress towards PT goals: Progressing toward goals    Frequency    Min 2X/week      PT Plan      Co-evaluation              AM-PAC PT "6 Clicks" Mobility   Outcome Measure  Help needed turning from your back to your side while in a flat bed without using bedrails?: A Little Help needed moving from lying on your back to sitting on the side  of a flat bed without using bedrails?: A Little Help needed moving to and from a bed to a chair (including a wheelchair)?: A Little Help needed standing up from a chair using your arms (e.g., wheelchair or bedside chair)?: A Little Help needed to walk in hospital room?: A Lot Help needed climbing 3-5 steps with a railing? : A Lot 6 Click Score: 16    End of Session Equipment Utilized During Treatment: Gait belt Activity Tolerance: Patient tolerated treatment well Patient left: in chair;with call bell/phone within reach;with chair alarm set   PT Visit Diagnosis: History of falling (Z91.81);Pain;Muscle weakness (generalized) (M62.81);Difficulty in walking, not elsewhere classified (R26.2);Unsteadiness on feet (R26.81)     Time: 1478-2956 PT Time Calculation (min) (ACUTE ONLY): 28 min  Charges:    $Gait Training: 8-22 mins $Therapeutic Activity: 8-22 mins PT General Charges $$ ACUTE PT VISIT: 1 Visit                         Tanda Falter, PT Acute Rehabilitation  Office: [phone]

## 2023-07-18 NOTE — Progress Notes (Signed)
 Daily Progress Note   Patient Name: Megan Baldwin       Date: 07/18/2023 DOB: 26-Nov-1933  Age: 89 y.o. MRN#: [id number] Attending Physician: Enrigue Harvard, DO Primary Care Physician: Roslyn Coombe, MD Admit Date: 07/14/2023  Reason for Consultation/Follow-up: Establishing goals of care  Subjective: Confused this am, no distress, PT and cardiology note reviewed.   Length of Stay: 4  Current Medications: Scheduled Meds:  . acetaminophen   1,000 mg Oral TID  . amLODipine   5 mg Oral Daily  . apixaban   10 mg Oral BID   Followed by  . [START ON 07/24/2023] apixaban   5 mg Oral BID  . cyanocobalamin   1,000 mcg Intramuscular Daily  . diclofenac  Sodium  2 g Topical QID  . donepezil   5 mg Oral QHS  . feeding supplement  237 mL Oral BID BM  . rosuvastatin   10 mg Oral Daily    Continuous Infusions: . cefTRIAXone  (ROCEPHIN )  IV 200 mL/hr at 07/18/23 0307    PRN Meds: ipratropium-albuterol , nitroGLYCERIN , ondansetron  **OR** ondansetron  (ZOFRAN ) IV, mouth rinse, senna-docusate  Physical Exam         No acute distress Resting comfortably No edema  Vital Signs: BP 131/66 (BP Location: Left Arm)   Pulse 89   Temp 100 F (37.8 C) (Oral)   Resp 16   Ht 5\' 3"  (1.6 m)   Wt 68 kg   SpO2 100%   BMI 26.57 kg/m  SpO2: SpO2: 100 % O2 Device: O2 Device: Room Air O2 Flow Rate:    Intake/output summary:  Intake/Output Summary (Last 24 hours) at 07/18/2023 1140 Last data filed at 07/18/2023 4098 Gross per 24 hour  Intake 340 ml  Output 750 ml  Net -410 ml   LBM: Last BM Date : 07/17/23 Baseline Weight: Weight: 68 kg Most recent weight: Weight: 68 kg       Palliative Assessment/Data:      Patient Active Problem List   Diagnosis Date Noted  . Acute pulmonary embolism without  acute cor pulmonale (HCC) 07/18/2023  . Elevated troponin 07/18/2023  . Palliative care by specialist 07/17/2023  . Acute metabolic encephalopathy 07/14/2023  . Acute cystitis without hematuria 07/14/2023  . Normocytic anemia 07/14/2023  . Urinary frequency 06/22/2023  . Bilateral hip joint arthritis 05/14/2023  . Fall 05/14/2023  .  Gait disorder 05/14/2023  . Vitamin D  deficiency 05/14/2023  . Edema 05/14/2023  . Memory changes 04/27/2022  . Recurrent falls 04/27/2022  . Rib pain on left side 04/27/2022  . Primary osteoarthritis of left hip 04/22/2021  . Weight loss 06/03/2020  . B12 deficiency 06/03/2020  . Depression 01/21/2019  . Vertigo 04/19/2018  . Hyperglycemia 09/26/2017  . CKD (chronic kidney disease) stage 3, GFR 30-59 ml/min (HCC) 09/26/2017  . Dizziness 05/03/2014  . Bilateral hearing loss 12/19/2013  . COPD exacerbation (HCC) 04/10/2013  . Pain, joint, shoulder region, left 12/21/2012  . Aortic valve regurgitation 10/12/2010  . Encounter for well adult exam with abnormal findings 10/12/2010  . COPD (chronic obstructive pulmonary disease) (HCC) 05/01/2009  . SHOULDER PAIN, RIGHT 04/18/2009  . NECK PAIN, RIGHT 04/18/2009  . PARESTHESIA 08/09/2008  . LUNG NODULE 08/02/2007  . Hyperlipidemia 12/30/2006  . Anxiety state 12/30/2006  . Essential hypertension 12/30/2006  . Allergic rhinitis 12/30/2006  . Diverticulosis of colon 12/30/2006  . Osteopenia 12/30/2006  . History of colonic polyps 12/30/2006  . Calculus of gallbladder 12/29/2006  . OSTEOARTHRITIS, LUMBOSACRAL SPINE 12/29/2006  . DEGENERATIVE DISC DISEASE, CERVICAL SPINE 12/29/2006  . COMPUTERIZED TOMOGRAPHY, CHEST, ABNORMAL 12/29/2006    Palliative Care Assessment & Plan   Patient Profile:    Assessment:  89 y.o. female  with a history of hypertension, hyperlipidemia, COPD, mild cognitive impairment  PE   Recommendations/Plan:  Full code for now, advance care planning documents on chart  reviewed.  Continue current mode of care.  Recommend SNF rehab with palliative on discharge, monitor PO intake, PT participation and hospital course for now.     Code Status:    Code Status Orders  (From admission, onward)           Start     Ordered   07/14/23 1901  Full code  Continuous       Question:  By:  Answer:  Consent: discussion documented in EHR   07/14/23 1902           Code Status History     This patient has a current code status but no historical code status.      Advance Directive Documentation    Flowsheet Row Most Recent Value  Type of Advance Directive Healthcare Power of Attorney  Pre-existing out of facility DNR order (yellow form or pink MOST form) --  "MOST" Form in Place? --       Prognosis:  Unable to determine  Discharge Planning: Skilled Nursing Facility for rehab with Palliative care service follow-up  Care plan was discussed with IDT  Thank you for allowing the Palliative Medicine Team to assist in the care of this patient.  Low MDM.      Greater than 50%  of this time was spent counseling and coordinating care related to the above assessment and plan.  Lujean Sake, MD  Please contact Palliative Medicine Team phone at [phone] for questions and concerns.

## 2023-07-18 NOTE — Progress Notes (Signed)
 Progress Note  Patient Name: Megan Baldwin Date of Encounter: 07/18/2023  Primary Cardiologist: None   Subjective   BP 135/73. Cr 0.84. Hgb 9.0.  She is confused this morning, oriented to person only.  She denies any chest pain or dyspnea  Inpatient Medications    Scheduled Meds:  acetaminophen   1,000 mg Oral TID   amLODipine   5 mg Oral Daily   apixaban   10 mg Oral BID   Followed by   Cecily Cohen ON 07/24/2023] apixaban   5 mg Oral BID   diclofenac  Sodium  2 g Topical QID   donepezil   5 mg Oral QHS   feeding supplement  237 mL Oral BID BM   rosuvastatin   10 mg Oral Daily   Continuous Infusions:  cefTRIAXone  (ROCEPHIN )  IV 200 mL/hr at 07/18/23 0307   PRN Meds: ipratropium-albuterol , nitroGLYCERIN , ondansetron  **OR** ondansetron  (ZOFRAN ) IV, mouth rinse, senna-docusate   Vital Signs    Vitals:   07/17/23 1717 07/17/23 1900 07/17/23 2003 07/18/23 0441  BP:   134/70 135/73  Pulse:   89 90  Resp:   20 16  Temp: (!) 100.7 F (38.2 C) (!) 100.4 F (38 C) 99.4 F (37.4 C) 99.4 F (37.4 C)  TempSrc: Oral  Oral Oral  SpO2:   100% 100%  Weight:      Height:        Intake/Output Summary (Last 24 hours) at 07/18/2023 0826 Last data filed at 07/18/2023 0307 Gross per 24 hour  Intake 580 ml  Output 750 ml  Net -170 ml   Filed Weights   07/14/23 1614  Weight: 68 kg    Telemetry    Sinus rhythm - Personally Reviewed  ECG    No new   Physical Exam   GEN: No acute distress.   Neck: No JVD Cardiac: RRR, no murmurs, rubs, or gallops.  Respiratory: Clear to auscultation bilaterally. GI: Soft, nontender, non-distended  MS: No edema; No deformity. Neuro:  Nonfocal  Psych: Oriented to person only  Labs    Chemistry Recent Labs  Lab 07/14/23 1648 07/14/23 1704 07/16/23 1409 07/17/23 0705 07/18/23 0350  NA 134*   < > 135 135 132*  K 4.0   < > 4.2 3.9 3.7  CL 103   < > 107 106 103  CO2 21*   < > 18* 22 19*  GLUCOSE 83   < > 120* 99 95  BUN 21   < > 16 15  12   CREATININE 1.01*   < > 0.95 0.86 0.84  CALCIUM  9.2   < > 8.6* 8.5* 8.2*  PROT 7.5  --   --   --   --   ALBUMIN 3.6  --   --   --   --   AST 28  --   --   --   --   ALT 17  --   --   --   --   ALKPHOS 65  --   --   --   --   BILITOT 1.0  --   --   --   --   GFRNONAA 54*   < > 58* >60 >60  ANIONGAP 10   < > 10 7 10    < > = values in this interval not displayed.     Hematology Recent Labs  Lab 07/16/23 1409 07/17/23 0705 07/18/23 0350  WBC 7.0 8.7 12.3*  RBC 3.15* 3.27* 2.98*  HGB 9.5* 9.7* 9.0*  HCT 31.1*  31.7* 28.0*  MCV 98.7 96.9 94.0  MCH 30.2 29.7 30.2  MCHC 30.5 30.6 32.1  RDW 16.3* 16.2* 16.1*  PLT 96* 106* 113*    Cardiac EnzymesNo results for input(s): "TROPONINI" in the last 168 hours. No results for input(s): "TROPIPOC" in the last 168 hours.   BNP Recent Labs  Lab 07/16/23 0107  BNP 139.4*     DDimer  Recent Labs  Lab 07/16/23 0242 07/16/23 0609  DDIMER 18.98* 18.87*     Summary of Pertinent studies    TTE: EF 60 to 65%, normal RV function, severe left atrial enlargement, mild mitral stenosis, mild to moderate aortic regurgitation  Labs: reviewed  Patient Profile     89 y.o. female  with a history of hypertension, hyperlipidemia, COPD, mild cognitive impairment who is being seen today for the evaluation of troponin elevation at the request of Dr. Betsey Brow.   Assessment & Plan    Troponin elevation, chest pain - Acute PE Troponin is slightly elevated and in a fairly flat trajectory CT PE returned positive for PE Echocardiogram shows normal LVEF, no evidence of right heart strain with normal RV size and systolic function Continue rosuvastatin    Acute cystitis Managed with Rocephin   Valvular heart disease Mild mitral stenosis, mild to moderate aortic regurgitation.  Recommend outpatient monitoring  [facility] will sign off.   Medication Recommendations: Continue Eliquis  Other recommendations (labs, testing, etc):  None Follow up as an outpatient: We will schedule   For questions or updates, please contact [facility] Please consult [email or web address] for contact info under Cardiology/STEMI.      Signed, Wendie Hamburg, MD 07/18/2023, 8:26 AM

## 2023-07-18 NOTE — Progress Notes (Signed)
 PROGRESS NOTE  Megan Baldwin  WUJ:[id number] DOB: 1933/07/16 DOA: 07/14/2023 PCP: Roslyn Coombe, MD  Consultants  Brief Narrative: 89 y.o. female with medical history significant for anxiety, COPD, mild cognitive impairment, vitamin B12 deficiency, osteoarthritis, HLD, HTN, DDD, and osteoporosis who presented to EMS for evaluation of altered mental status.  Per daughter, patient's friend came by her house and noticed that patient was on the ground. Patient has history of recurrent falls and has fallen about 3 times in the past 2 months (daughter thinks this is likely due to patient not using her walker appropriately). Patient has been complaining of some left leg pain and over the last year, has had a decline in her cognition. Brought to ED for same, found to have UT and PE/DVT.  Assessment & Plan: Acute metabolic encephalopathy - Initially presumed in the setting of UTI with underlying mild cognitive impairment and memory loss - Seems like she is rather having acceleration of the decline of her baseline cognition.  Believe she is at least mild if not moderate dementia. -Aricept   - maximize B12 stores in light of notable B12 deficiency, see below - Recommend outpatient neurology follow-up   PE: - No chest pain, no respiratory/cardiopulmonary symptoms. - Eliquis  today. - Bilateral Dopplers revealed DVTs bilaterally today.  Daughter not surprised by this as she has had lower extremity swelling for a couple of weeks prior to admission.  Also had episode of chest pain about a week prior to admission according to family with some shortness of breath.  Resolved, so they didn't bring her to the ER. - Echo repeated this admit, no evidence of heart strain, EF good.     Fever - Unclear source-could be from blood clots - Questionable pneumonia versus UTI: Will treat for both -Monitor symptoms    Acute cystitis, working diagnosis thus far: - Presented with altered mental status and concern for  infection on U/A.  Since admit, though, she's been afebrile, with no leukocytosis - culture is no growth but she does have fever so will treat with rocephin    Normocytic anemia - Hgb stabilized at 9-10 -repleted B12  Thrombocytopenia: Fortunately uptrending.     HTN - BP stable with SBP in the 130s to 140s - Patient tried amlodipine , spironolactone  and labetalol , PCP suspects medication noncompliance - Resume low-dose amlodipine  for now, can add other BP meds as needed   Vitamin B12 deficiency - Vitamin B12 level significantly low at 73 2 months ago - This is likely contributing to patient's cognitive deficits and short term memory loss - Start IM vitamin B12 1000 mcg - Resume oral supplementation at discharge   # Recurrent falls # Bilateral hip osteoarthritis - Per daughter, patient has history of falls and has fallen about 3 times in the past 2 months - X-ray of the hips shows advanced osteoarthritis bilaterally but no acute bony abnormality - as noted above, it's unclear how long she was down at home with most recent fall - Pain control with scheduled Tylenol  and Voltaren  gel - PT/OT eval and treat - Fall precautions   # BLE edema - Has 1+ BLE edema at baseline, improving since admit, thought due to venous insufficiency by her PCP - longstanding, Doppler ultrasounds revealed bilateral DVTs.  Already on Eliquis .   # COPD - Stable -As needed DuoNebs   # Anxiety - Stable     DVT prophylaxis:  Place TED hose Start: 07/14/23 2037 Place and maintain sequential compression device Start: 07/14/23 1911 apixaban  (ELIQUIS )  tablet 10 mg  apixaban  (ELIQUIS ) tablet 5 mg  Code Status:   Code Status: Full Code Family Communication: called daughter Level of care: Med-Surg Status is: Inpatient  SNF once afebrile   Consults called: cardiology   Subjective: Confused this morning, concerned as to why people are in her house  Objective: Vitals:   07/17/23 1900 07/17/23 2003  07/18/23 0441 07/18/23 0911  BP:  134/70 135/73 131/66  Pulse:  89 90 89  Resp:  20 16 16   Temp: (!) 100.4 F (38 C) 99.4 F (37.4 C) 99.4 F (37.4 C) 100 F (37.8 C)  TempSrc:  Oral Oral Oral  SpO2:  100% 100% 100%  Weight:      Height:        Intake/Output Summary (Last 24 hours) at 07/18/2023 1143 Last data filed at 07/18/2023 0307 Gross per 24 hour  Intake 340 ml  Output 750 ml  Net -410 ml   Filed Weights   07/14/23 1614  Weight: 68 kg   Body mass index is 26.57 kg/m.   General: Appearance:     Overweight female in no acute distress     Lungs:     respirations unlabored  Heart:    Normal heart rate. Normal rhythm.    MS:   All extremities are intact.   Neurologic: Awake but confused, cooperative but mistrusting      I have personally reviewed the following labs and images: CBC: Recent Labs  Lab 07/14/23 1648 07/14/23 1704 07/15/23 0353 07/16/23 0107 07/16/23 1409 07/17/23 0705 07/18/23 0350  WBC 5.4  --  4.2 6.0 7.0 8.7 12.3*  NEUTROABS 3.4  --   --   --   --   --   --   HGB 9.4*   < > 9.2* 10.1* 9.5* 9.7* 9.0*  HCT 29.2*   < > 28.5* 31.7* 31.1* 31.7* 28.0*  MCV 93.6  --  93.4 94.1 98.7 96.9 94.0  PLT 69*  --  71* 92* 96* 106* 113*   < > = values in this interval not displayed.   BMP &GFR Recent Labs  Lab 07/15/23 0353 07/16/23 0107 07/16/23 1409 07/17/23 0705 07/18/23 0350  NA 135 132* 135 135 132*  K 3.7 3.7 4.2 3.9 3.7  CL 106 102 107 106 103  CO2 19* 20* 18* 22 19*  GLUCOSE 72 107* 120* 99 95  BUN 17 17 16 15 12   CREATININE 0.88 0.93 0.95 0.86 0.84  CALCIUM  8.4* 8.8* 8.6* 8.5* 8.2*   Estimated Creatinine Clearance: 42.8 mL/min (by C-G formula based on SCr of 0.84 mg/dL). Liver & Pancreas: Recent Labs  Lab 07/14/23 1648  AST 28  ALT 17  ALKPHOS 65  BILITOT 1.0  PROT 7.5  ALBUMIN 3.6   No results for input(s): "LIPASE", "AMYLASE" in the last 168 hours. Recent Labs  Lab 07/14/23 1648  AMMONIA <13   Diabetic: No  results for input(s): "HGBA1C" in the last 72 hours. No results for input(s): "GLUCAP" in the last 168 hours. Cardiac Enzymes: No results for input(s): "CKTOTAL", "CKMB", "CKMBINDEX", "TROPONINI" in the last 168 hours. No results for input(s): "PROBNP" in the last 8760 hours. Coagulation Profile: Recent Labs  Lab 07/16/23 0609  INR 1.4*   Thyroid  Function Tests: No results for input(s): "TSH", "T4TOTAL", "FREET4", "T3FREE", "THYROIDAB" in the last 72 hours.  Lipid Profile: Recent Labs    07/16/23 0451  CHOL 176  HDL 55  LDLCALC 104*  TRIG 85  CHOLHDL 3.2  Anemia Panel: Recent Labs    07/18/23 0849  VITAMINB12 1,205*   Urine analysis:    Component Value Date/Time   COLORURINE STRAW (A) 07/17/2023 2200   APPEARANCEUR CLEAR 07/17/2023 2200   LABSPEC 1.005 07/17/2023 2200   PHURINE 7.0 07/17/2023 2200   GLUCOSEU NEGATIVE 07/17/2023 2200   GLUCOSEU NEGATIVE 05/13/2023 1439   HGBUR NEGATIVE 07/17/2023 2200   BILIRUBINUR NEGATIVE 07/17/2023 2200   KETONESUR NEGATIVE 07/17/2023 2200   PROTEINUR NEGATIVE 07/17/2023 2200   UROBILINOGEN 1.0 05/13/2023 1439   NITRITE NEGATIVE 07/17/2023 2200   LEUKOCYTESUR NEGATIVE 07/17/2023 2200   Sepsis Labs: Invalid input(s): "PROCALCITONIN", "LACTICIDVEN"  Microbiology: Recent Results (from the past 240 hours)  Urine Culture     Status: None   Collection Time: 07/14/23  4:42 PM   Specimen: Urine, Random  Result Value Ref Range Status   Specimen Description   Final    URINE, RANDOM Performed at [facility], [address], [place], [place] [address]    Special Requests   Final    NONE Reflexed from [id number] Performed at [facility], [address] [place], [place] [address]    Culture   Final    NO GROWTH Performed at [facility], [address] [place], [place] [address]    Report Status 07/15/2023 FINAL  Final  Culture, blood (Routine X 2) w Reflex to ID Panel     Status:  None (Preliminary result)   Collection Time: 07/17/23  6:44 PM   Specimen: BLOOD  Result Value Ref Range Status   Specimen Description   Final    BLOOD BLOOD LEFT HAND Performed at [facility], [address] [place], [place] [address]    Special Requests   Final    BOTTLES DRAWN AEROBIC AND ANAEROBIC Blood Culture results may not be optimal due to an inadequate volume of blood received in culture bottles Performed at [facility], [address], [place], [place] [address]    Culture   Final    NO GROWTH < 12 HOURS Performed at [facility], [address] [place], [place] [address]    Report Status PENDING  Incomplete  Culture, blood (Routine X 2) w Reflex to ID Panel     Status: None (Preliminary result)   Collection Time: 07/17/23  6:44 PM   Specimen: BLOOD  Result Value Ref Range Status   Specimen Description   Final    BLOOD BLOOD LEFT HAND Performed at [facility], [address], [place], [place] [address]    Special Requests   Final    Blood Culture results may not be optimal due to an inadequate volume of blood received in culture bottles BOTTLES DRAWN AEROBIC AND ANAEROBIC Performed at [facility], [address], [place], [place] [address]    Culture   Final    NO GROWTH < 12 HOURS Performed at [facility], [address], [place], [place] [address]    Report Status PENDING  Incomplete    Radiology Studies: VAS US  LOWER EXTREMITY VENOUS (DVT) Result Date: 07/17/2023  Lower Venous DVT Study Patient Name:  HYE TRAWICK  Date of Exam:   07/17/2023 Medical Rec #: [id number]        Accession #:    [id number] Date of Birth: [birth year]         Patient Gender: F Patient Age:   50 years Exam Location:  [facility] Procedure:      VAS US  LOWER EXTREMITY VENOUS (DVT) Referring Phys: ABIGAIL CHAVEZ --------------------------------------------------------------------------------   Indications: Elevated D-dimer (18.87), and pulmonary embolism.  Limitations: Poor ultrasound/tissue interface and patient movement (restlessness). Comparison Study: No previous exams Performing Technologist: Jody Hill RVT, RDMS  Examination Guidelines: A complete evaluation includes B-mode imaging, spectral Doppler, color Doppler, and power Doppler as needed of all accessible portions of each vessel. Bilateral testing is considered an integral part of a complete examination. Limited examinations for reoccurring indications may be performed as noted. The reflux portion of the exam is performed with the patient in reverse Trendelenburg.  +---------+---------------+---------+-----------+----------+--------------+ RIGHT    CompressibilityPhasicitySpontaneityPropertiesThrombus Aging +---------+---------------+---------+-----------+----------+--------------+ CFV      Full           Yes      Yes                                 +---------+---------------+---------+-----------+----------+--------------+ SFJ      Full                                                        +---------+---------------+---------+-----------+----------+--------------+ FV Prox  Full           Yes      Yes                                 +---------+---------------+---------+-----------+----------+--------------+ FV Mid   Full           Yes      Yes                                 +---------+---------------+---------+-----------+----------+--------------+ FV DistalNone           No       No                   Acute          +---------+---------------+---------+-----------+----------+--------------+ PFV      Full           Yes      Yes                                 +---------+---------------+---------+-----------+----------+--------------+ POP      None           No       No                   Acute          +---------+---------------+---------+-----------+----------+--------------+ PTV       None           No       No                   Acute          +---------+---------------+---------+-----------+----------+--------------+ PERO     None           No       No  Acute          +---------+---------------+---------+-----------+----------+--------------+   +---------+---------------+---------+-----------+----------+-----------------+ LEFT     CompressibilityPhasicitySpontaneityPropertiesThrombus Aging    +---------+---------------+---------+-----------+----------+-----------------+ CFV      Full           Yes      Yes                                    +---------+---------------+---------+-----------+----------+-----------------+ SFJ      Partial                                      Age Indeterminate +---------+---------------+---------+-----------+----------+-----------------+ FV Prox  Partial        Yes      Yes                  Age Indeterminate +---------+---------------+---------+-----------+----------+-----------------+ FV Mid   Partial        Yes      Yes                  Age Indeterminate +---------+---------------+---------+-----------+----------+-----------------+ FV DistalFull           Yes      Yes                                    +---------+---------------+---------+-----------+----------+-----------------+ PFV      Partial        No       Yes                  Age Indeterminate +---------+---------------+---------+-----------+----------+-----------------+ POP      Full           Yes      Yes                                    +---------+---------------+---------+-----------+----------+-----------------+ PTV      Full                                                           +---------+---------------+---------+-----------+----------+-----------------+ PERO     Full                                                            +---------+---------------+---------+-----------+----------+-----------------+     Summary: BILATERAL: -No evidence of popliteal cyst, bilaterally. RIGHT: - Findings consistent with acute deep vein thrombosis involving the right femoral vein, right popliteal vein, right posterior tibial veins, and right peroneal veins.   LEFT: - Findings consistent with age indeterminate deep vein thrombosis involving the SF junction, left femoral vein, and left proximal profunda vein.   *See table(s) above for measurements and observations.    Preliminary    ECHOCARDIOGRAM COMPLETE Result Date: 07/17/2023    ECHOCARDIOGRAM REPORT   Patient Name:   MILADY FLEENER Date of Exam: 07/17/2023 Medical Rec #:  [id number]       Height:       63.0 in Accession #:    [id number]      Weight:       150.0 lb Date of Birth:  05-29-1933        BSA:          1.711 m Patient Age:    89 years        BP:           120/99 mmHg Patient Gender: F               HR:           85 bpm. Exam Location:  Inpatient Procedure: 2D Echo, 3D Echo, Color Doppler, Cardiac Doppler and Strain Analysis            (Both Spectral and Color Flow Doppler were utilized during            procedure). Indications:    Pulmonary Embolus  History:        Patient has prior history of Echocardiogram examinations, most                 recent 10/23/2010. Risk Factors:Hypertension and Dyslipidemia.                 Chronic Kidney Diseas.  Sonographer:    Travis Friedman RDCS Referring Phys: [id number] AUGUSTUS E MEALOR IMPRESSIONS  1. Left ventricular ejection fraction, by estimation, is 60 to 65%. Left ventricular ejection fraction by 3D volume is 61 %. The left ventricle has normal function. The left ventricle has no regional wall motion abnormalities. There is mild left ventricular hypertrophy of the basal-septal segment. Left ventricular diastolic parameters are consistent with Grade I diastolic dysfunction (impaired relaxation). The average left ventricular global longitudinal strain is  -18.1 %. The global longitudinal strain is normal.  2. Right ventricular systolic function is normal. The right ventricular size is normal. There is normal pulmonary artery systolic pressure. The estimated right ventricular systolic pressure is 31.9 mmHg.  3. Left atrial size was severely dilated.  4. The mitral valve is degenerative. No evidence of mitral valve regurgitation. Mild mitral stenosis. The mean mitral valve gradient is 4.0 mmHg. Severe mitral annular calcification.  5. The aortic valve is tricuspid. There is moderate calcification of the aortic valve. Aortic valve regurgitation is mild to moderate. No aortic stenosis is present. Aortic valve mean gradient measures 7.0 mmHg. Aortic valve Vmax measures 1.71 m/s.  6. The inferior vena cava is normal in size with greater than 50% respiratory variability, suggesting right atrial pressure of 3 mmHg. FINDINGS  Left Ventricle: Left ventricular ejection fraction, by estimation, is 60 to 65%. Left ventricular ejection fraction by 3D volume is 61 %. The left ventricle has normal function. The left ventricle has no regional wall motion abnormalities. The average left ventricular global longitudinal strain is -18.1 %. Strain was performed and the global longitudinal strain is normal. The left ventricular internal cavity size was normal in size. There is mild left ventricular hypertrophy of the basal-septal segment. Left ventricular diastolic parameters are consistent with Grade I diastolic dysfunction (impaired relaxation). Right Ventricle: The right ventricular size is normal. No increase in right ventricular wall thickness. Right ventricular systolic function is normal. There is normal pulmonary artery systolic pressure. The tricuspid regurgitant velocity is 2.69 m/s, and  with an assumed right atrial pressure of 3 mmHg, the estimated right ventricular systolic pressure is 31.9 mmHg. Left Atrium: Left  atrial size was severely dilated. Right Atrium: Right atrial  size was normal in size. Pericardium: There is no evidence of pericardial effusion. Mitral Valve: The mitral valve is degenerative in appearance. There is moderate thickening of the anterior mitral valve leaflet(s). There is moderate calcification of the anterior mitral valve leaflet(s). Mildly decreased mobility of the mitral valve leaflets. Severe mitral annular calcification. No evidence of mitral valve regurgitation. Mild mitral valve stenosis. MV peak gradient, 9.7 mmHg. The mean mitral valve gradient is 4.0 mmHg. Tricuspid Valve: The tricuspid valve is normal in structure. Tricuspid valve regurgitation is mild . No evidence of tricuspid stenosis. Aortic Valve: The aortic valve is tricuspid. There is moderate calcification of the aortic valve. Aortic valve regurgitation is mild to moderate. Aortic regurgitation PHT measures 372 msec. No aortic stenosis is present. Aortic valve mean gradient measures 7.0 mmHg. Aortic valve peak gradient measures 11.7 mmHg. Aortic valve area, by VTI measures 3.37 cm. Pulmonic Valve: The pulmonic valve was normal in structure. Pulmonic valve regurgitation is not visualized. No evidence of pulmonic stenosis. Aorta: The aortic root is normal in size and structure. Venous: The inferior vena cava is normal in size with greater than 50% respiratory variability, suggesting right atrial pressure of 3 mmHg. IAS/Shunts: No atrial level shunt detected by color flow Doppler. Additional Comments: 3D was performed not requiring image post processing on an independent workstation and was normal.  LEFT VENTRICLE PLAX 2D LVIDd:         3.60 cm         Diastology LVIDs:         2.00 cm         LV e' medial:    0.05 cm/s LV PW:         1.20 cm         LV E/e' medial:  24.1 LV IVS:        1.20 cm         LV e' lateral:   0.06 cm/s LVOT diam:     2.10 cm         LV E/e' lateral: 19.8 LV SV:         109 LV SV Index:   64              2D Longitudinal LVOT Area:     3.46 cm        Strain                                 2D Strain GLS   -18.1 %                                Avg:                                 3D Volume EF                                LV 3D EF:    Left  ventricul                                             ar                                             ejection                                             fraction                                             by 3D                                             volume is                                             61 %.                                 3D Volume EF:                                3D EF:        61 %                                LV EDV:       136 ml                                LV ESV:       54 ml                                LV SV:        82 ml RIGHT VENTRICLE             IVC RV Basal diam:  3.70 cm     IVC diam: 1.20 cm RV S prime:     11.80 cm/s TAPSE (M-mode): 2.5 cm LEFT ATRIUM            Index        RIGHT ATRIUM           Index LA diam:      3.90 cm  2.28 cm/m   RA Area:     16.40 cm LA Vol (A2C): 36.7 ml  21.45 ml/m  RA Volume:   42.20 ml  24.66 ml/m LA Vol (A4C): 106.0 ml 61.95 ml/m  AORTIC  VALVE AV Area (Vmax):    3.67 cm AV Area (Vmean):   3.41 cm AV Area (VTI):     3.37 cm AV Vmax:           171.00 cm/s AV Vmean:          122.000 cm/s AV VTI:            0.323 m AV Peak Grad:      11.7 mmHg AV Mean Grad:      7.0 mmHg LVOT Vmax:         181.00 cm/s LVOT Vmean:        120.000 cm/s LVOT VTI:          0.314 m LVOT/AV VTI ratio: 0.97 AI PHT:            372 msec  AORTA Ao Asc diam: 3.20 cm MITRAL VALVE                TRICUSPID VALVE MV Area (PHT): 3.34 cm     TR Peak grad:   28.9 mmHg MV Area VTI:   2.10 cm     TR Vmax:        269.00 cm/s MV Peak grad:  9.7 mmHg MV Mean grad:  4.0 mmHg     SHUNTS MV Vmax:       1.56 m/s     Systemic VTI:  0.31 m MV Vmean:      90.1 cm/s    Systemic Diam: 2.10 cm MV Decel Time: 227 msec MV E velocity: 1.10 cm/s MV A velocity: 167.00 cm/s MV  E/A ratio:  0.01 Megan Gathers MD Electronically signed by Megan Gathers MD Signature Date/Time: 07/17/2023/12:08:33 PM    Final     Scheduled Meds:  acetaminophen   1,000 mg Oral TID   amLODipine   5 mg Oral Daily   apixaban   10 mg Oral BID   Followed by   Megan Baldwin ON 07/24/2023] apixaban   5 mg Oral BID   cyanocobalamin   1,000 mcg Intramuscular Daily   diclofenac  Sodium  2 g Topical QID   donepezil   5 mg Oral QHS   feeding supplement  237 mL Oral BID BM   rosuvastatin   10 mg Oral Daily   Continuous Infusions:  cefTRIAXone  (ROCEPHIN )  IV 200 mL/hr at 07/18/23 0307     LOS: 4 days   35 minutes with more than 50% spent in reviewing records, counseling patient/family and coordinating care.  Enrigue Harvard, DO [facility] [email or web address] 07/18/2023, 11:43 AM

## 2023-07-19 ENCOUNTER — Other Ambulatory Visit (HOSPITAL_COMMUNITY): Payer: Self-pay

## 2023-07-19 ENCOUNTER — Telehealth (HOSPITAL_COMMUNITY): Payer: Self-pay | Admitting: Pharmacy Technician

## 2023-07-19 DIAGNOSIS — G9341 Metabolic encephalopathy: Secondary | ICD-10-CM | POA: Diagnosis not present

## 2023-07-19 LAB — CBC
HCT: 28.8 % — ABNORMAL LOW (ref 36.0–46.0)
Hemoglobin: 9 g/dL — ABNORMAL LOW (ref 12.0–15.0)
MCH: 29.8 pg (ref 26.0–34.0)
MCHC: 31.3 g/dL (ref 30.0–36.0)
MCV: 95.4 fL (ref 80.0–100.0)
Platelets: 134 10*3/uL — ABNORMAL LOW (ref 150–400)
RBC: 3.02 MIL/uL — ABNORMAL LOW (ref 3.87–5.11)
RDW: 16.1 % — ABNORMAL HIGH (ref 11.5–15.5)
WBC: 10 10*3/uL (ref 4.0–10.5)
nRBC: 0 % (ref 0.0–0.2)

## 2023-07-19 NOTE — Progress Notes (Addendum)
 PROGRESS NOTE  Megan Baldwin  WUJ:[id number] DOB: 1933-12-16 DOA: 07/14/2023 PCP: Roslyn Coombe, MD  Consultants  Brief Narrative: 89 y.o. female with medical history significant for anxiety, COPD, mild cognitive impairment, vitamin B12 deficiency, osteoarthritis, HLD, HTN, DDD, and osteoporosis who presented to EMS for evaluation of altered mental status.  Per daughter, patient's friend came by her house and noticed that patient was on the ground. Patient has history of recurrent falls and has fallen about 3 times in the past 2 months (daughter thinks this is likely due to patient not using her walker appropriately). Patient has been complaining of some left leg pain and over the last year, has had a decline in her cognition. Brought to ED for same, found to have UT and PE/DVT.  Plan is for eventual SNF but needs to remain afebrile prior to discharge (unclear source of fever: Questionable UTI versus pneumonia but could be blood clots).  Assessment & Plan: Acute metabolic encephalopathy - Initially presumed in the setting of UTI with underlying mild cognitive impairment and memory loss - Seems like she is rather having acceleration of the decline of her baseline cognition.  Believe she is at least mild if not moderate dementia. -Aricept   - maximize B12 stores in light of notable B12 deficiency-- repleted - Recommend outpatient neurology follow-up   PE: - No chest pain, no respiratory/cardiopulmonary symptoms. - Eliquis  today. - Bilateral Dopplers revealed DVTs bilaterally today.  Daughter not surprised by this as she has had lower extremity swelling for a couple of weeks prior to admission.  Also had episode of chest pain about a week prior to admission according to family with some shortness of breath.  Resolved, so they didn't bring her to the ER. - Echo repeated this admit, no evidence of heart strain, EF good.     Fever - Unclear source-could be from blood clots - Questionable pneumonia  versus UTI: Will treat for both -Monitor symptoms   Acute cystitis, working diagnosis thus far: - Presented with altered mental status and concern for infection on U/A.  Since admit, though, she's been afebrile, with no leukocytosis - culture is no growth but she does have fever so will treat with rocephin  x 5 days   Normocytic anemia - Hgb stabilized at 9-10 -repleted B12  Thrombocytopenia: Fortunately uptrending.     HTN - holding BP meds for now with parameters due to low BP   Vitamin B12 deficiency - Vitamin B12 level significantly low at 73 2 months ago - This is likely contributing to patient's cognitive deficits and short term memory loss - given IM vitamin B12 1000 mcg - Resume oral supplementation at discharge   # Recurrent falls # Bilateral hip osteoarthritis - Per daughter, patient has history of falls and has fallen about 3 times in the past 2 months - X-ray of the hips shows advanced osteoarthritis bilaterally but no acute bony abnormality - as noted above, it's unclear how long she was down at home with most recent fall - Pain control with scheduled Tylenol  and Voltaren  gel - PT/OT eval and treat - Fall precautions   # BLE edema - Has 1+ BLE edema at baseline, improving since admit, thought due to venous insufficiency by her PCP - longstanding, Doppler ultrasounds revealed bilateral DVTs.  Already on Eliquis .   # COPD - Stable -As needed DuoNebs   # Anxiety - Stable     DVT prophylaxis:  Place TED hose Start: 07/14/23 2037 Place and maintain sequential compression  device Start: 07/14/23 1911 apixaban  (ELIQUIS ) tablet 10 mg  apixaban  (ELIQUIS ) tablet 5 mg  Code Status:   Code Status: Full Code Family Communication: called daughter 5/20 Level of care: Med-Surg Status is: Inpatient  SNF once afebrile   Consults called: cardiology   Subjective: More oriented this AM-- knew she was in the hospital  Objective: Vitals:   07/18/23 1700 07/18/23  1958 07/19/23 0420 07/19/23 1051  BP: (!) 116/105 120/62 (!) 142/76 96/85  Pulse: 77 83 88 90  Resp:    20  Temp: 98.7 F (37.1 C) 99 F (37.2 C) 98.6 F (37 C) 98.6 F (37 C)  TempSrc: Oral Oral Oral Oral  SpO2: 100% 99% 96% 100%  Weight:      Height:        Intake/Output Summary (Last 24 hours) at 07/19/2023 1115 Last data filed at 07/19/2023 0945 Gross per 24 hour  Intake 4226.67 ml  Output 425 ml  Net 3801.67 ml   Filed Weights   07/14/23 1614  Weight: 68 kg   Body mass index is 26.57 kg/m.   General: Appearance:     Overweight female in no acute distress     Lungs:     Clear to auscultation bilaterally, respirations unlabored  Heart:    Normal heart rate. Normal rhythm.    MS:   All extremities are intact.   Neurologic:   Awake, alert      I have personally reviewed the following labs and images: CBC: Recent Labs  Lab 07/14/23 1648 07/14/23 1704 07/16/23 0107 07/16/23 1409 07/17/23 0705 07/18/23 0350 07/19/23 0410  WBC 5.4   < > 6.0 7.0 8.7 12.3* 10.0  NEUTROABS 3.4  --   --   --   --   --   --   HGB 9.4*   < > 10.1* 9.5* 9.7* 9.0* 9.0*  HCT 29.2*   < > 31.7* 31.1* 31.7* 28.0* 28.8*  MCV 93.6   < > 94.1 98.7 96.9 94.0 95.4  PLT 69*   < > 92* 96* 106* 113* 134*   < > = values in this interval not displayed.   BMP &GFR Recent Labs  Lab 07/15/23 0353 07/16/23 0107 07/16/23 1409 07/17/23 0705 07/18/23 0350  NA 135 132* 135 135 132*  K 3.7 3.7 4.2 3.9 3.7  CL 106 102 107 106 103  CO2 19* 20* 18* 22 19*  GLUCOSE 72 107* 120* 99 95  BUN 17 17 16 15 12   CREATININE 0.88 0.93 0.95 0.86 0.84  CALCIUM  8.4* 8.8* 8.6* 8.5* 8.2*   Estimated Creatinine Clearance: 42.8 mL/min (by C-G formula based on SCr of 0.84 mg/dL). Liver & Pancreas: Recent Labs  Lab 07/14/23 1648  AST 28  ALT 17  ALKPHOS 65  BILITOT 1.0  PROT 7.5  ALBUMIN 3.6   No results for input(s): "LIPASE", "AMYLASE" in the last 168 hours. Recent Labs  Lab 07/14/23 1648  AMMONIA  <13   Diabetic: No results for input(s): "HGBA1C" in the last 72 hours. No results for input(s): "GLUCAP" in the last 168 hours. Cardiac Enzymes: No results for input(s): "CKTOTAL", "CKMB", "CKMBINDEX", "TROPONINI" in the last 168 hours. No results for input(s): "PROBNP" in the last 8760 hours. Coagulation Profile: Recent Labs  Lab 07/16/23 0609  INR 1.4*   Thyroid  Function Tests: No results for input(s): "TSH", "T4TOTAL", "FREET4", "T3FREE", "THYROIDAB" in the last 72 hours.  Lipid Profile: No results for input(s): "CHOL", "HDL", "LDLCALC", "TRIG", "CHOLHDL", "LDLDIRECT" in  the last 72 hours.  Anemia Panel: Recent Labs    07/18/23 0849  VITAMINB12 1,205*   Urine analysis:    Component Value Date/Time   COLORURINE STRAW (A) 07/17/2023 2200   APPEARANCEUR CLEAR 07/17/2023 2200   LABSPEC 1.005 07/17/2023 2200   PHURINE 7.0 07/17/2023 2200   GLUCOSEU NEGATIVE 07/17/2023 2200   GLUCOSEU NEGATIVE 05/13/2023 1439   HGBUR NEGATIVE 07/17/2023 2200   BILIRUBINUR NEGATIVE 07/17/2023 2200   KETONESUR NEGATIVE 07/17/2023 2200   PROTEINUR NEGATIVE 07/17/2023 2200   UROBILINOGEN 1.0 05/13/2023 1439   NITRITE NEGATIVE 07/17/2023 2200   LEUKOCYTESUR NEGATIVE 07/17/2023 2200   Sepsis Labs: Invalid input(s): "PROCALCITONIN", "LACTICIDVEN"  Microbiology: Recent Results (from the past 240 hours)  Urine Culture     Status: None   Collection Time: 07/14/23  4:42 PM   Specimen: Urine, Random  Result Value Ref Range Status   Specimen Description   Final    URINE, RANDOM Performed at [facility], [address], [place], [place] [address]    Special Requests   Final    NONE Reflexed from [id number] Performed at [facility] [address], [place], [place] [address]    Culture   Final    NO GROWTH Performed at [facility], [address] [place], [place] [address]    Report Status 07/15/2023 FINAL  Final  Culture, blood (Routine X 2)  w Reflex to ID Panel     Status: None (Preliminary result)   Collection Time: 07/17/23  6:44 PM   Specimen: BLOOD  Result Value Ref Range Status   Specimen Description   Final    BLOOD BLOOD LEFT HAND Performed at [facility], [address], [place], [place] [address]    Special Requests   Final    BOTTLES DRAWN AEROBIC AND ANAEROBIC Blood Culture results may not be optimal due to an inadequate volume of blood received in culture bottles Performed at [facility], [address] [place], [place] [address]    Culture   Final    NO GROWTH 2 DAYS Performed at [facility], [address], [place], [place] [address]    Report Status PENDING  Incomplete  Culture, blood (Routine X 2) w Reflex to ID Panel     Status: None (Preliminary result)   Collection Time: 07/17/23  6:44 PM   Specimen: BLOOD  Result Value Ref Range Status   Specimen Description   Final    BLOOD BLOOD LEFT HAND Performed at [facility], [address], [place], [place] [address]    Special Requests   Final    Blood Culture results may not be optimal due to an inadequate volume of blood received in culture bottles BOTTLES DRAWN AEROBIC AND ANAEROBIC Performed at [facility], [address], [place], [place] [address]    Culture   Final    NO GROWTH 2 DAYS Performed at [facility], [address], [place], [place] [address]    Report Status PENDING  Incomplete    Radiology Studies: No results found.   Scheduled Meds:  acetaminophen   1,000 mg Oral TID   amLODipine   5 mg Oral Daily   apixaban   10 mg Oral BID   Followed by   Cecily Cohen ON 07/24/2023] apixaban   5 mg Oral BID   cyanocobalamin   1,000 mcg Intramuscular Daily   diclofenac  Sodium  2 g Topical QID  donepezil   5 mg Oral QHS   feeding supplement  237 mL Oral BID BM   rosuvastatin   10 mg Oral Daily   Continuous Infusions:  azithromycin  500 mg (07/19/23 1059)    cefTRIAXone  (ROCEPHIN )  IV Stopped (07/18/23 2026)     LOS: 5 days   35 minutes with more than 50% spent in reviewing records, counseling patient/family and coordinating care.  Enrigue Harvard, DO [facility] [email or web address] 07/19/2023, 11:15 AM

## 2023-07-19 NOTE — Telephone Encounter (Signed)
 Patient Product/process development scientist completed.    The patient is insured through [employer]. Patient has Medicare and is not eligible for a copay card, but may be able to apply for patient assistance or Medicare RX Payment Plan (Patient Must reach out to their plan, if eligible for payment plan), if available.    Ran test claim for Eliquis  5 mg and the current 30 day co-pay is $45.00.   This test claim was processed through [facility]- copay amounts may vary at other pharmacies due to pharmacy/plan contracts, or as the patient moves through the different stages of their insurance plan.     Morgan Arab, CPHT Pharmacy Technician III Certified Patient Advocate [facility] Patient Advocate Team Direct Number: [phone]  Fax: [phone]

## 2023-07-19 NOTE — Progress Notes (Signed)
 Occupational Therapy Treatment Patient Details Name: Megan Baldwin MRN: [id number] DOB: [birth year] Today's Date: 07/19/2023   History of present illness 89 yo female admitted with acute metabolic encephalopathy, cystitis, acute PE, bil DVTs. PMH: anxiety, COPD, mild cognitive impairment, vitamin B12 deficiency, osteoarthritis, HLD, HTN, DDD, and osteoporosis.   OT comments  Patient seen for skilled OT this afternoon. Patient open to all therapy activity presented but deferred OOB to recliner as patient reports she just lied down after being up in recliner. Able to perform seated and standing EOB with RW for BADL's and simple mobility as outlined below. Addressed functioanl balance, safety and activity tolerance this visit with emphasis on falls prevention and increasing processing and initiation. OT will continue to progress toward goals while on Acute unit. Patient will benefit from continued inpatient follow up therapy, <3 hours/day.        If plan is discharge home, recommend the following:  A little help with walking and/or transfers;A little help with bathing/dressing/bathroom;Assistance with cooking/housework;Direct supervision/assist for medications management;Direct supervision/assist for financial management;Assist for transportation;Help with stairs or ramp for entrance;Supervision due to cognitive status   Equipment Recommendations  None recommended by OT       Precautions / Restrictions Precautions Precautions: Fall Precaution/Restrictions Comments: incontinent-has depends in room Restrictions Weight Bearing Restrictions Per Provider Order: No       Mobility Bed Mobility Overal bed mobility: Needs Assistance Bed Mobility: Supine to Sit, Sit to Supine     Supine to sit: Contact guard, HOB elevated, Used rails Sit to supine: Contact guard assist, HOB elevated, Used rails   General bed mobility comments: side stepped with RW 5 steps up to head of bed prior to returning  to supine    Transfers Overall transfer level: Needs assistance Equipment used: Rolling walker (2 wheels) Transfers: Sit to/from Stand Sit to Stand: Min assist     Step pivot transfers: Min assist     General transfer comment: increased processing time for all steps of STS and side stepping mobility     Balance Overall balance assessment: Needs assistance, History of Falls Sitting-balance support: Feet supported, Single extremity supported Sitting balance-Leahy Scale: Good     Standing balance support: Bilateral upper extremity supported, During functional activity, Reliant on assistive device for balance Standing balance-Leahy Scale: Poor                             ADL either performed or assessed with clinical judgement   ADL Overall ADL's : Needs assistance/impaired     Grooming: Set up;Sitting;Supervision/safety;Wash/dry hands;Wash/dry face;Oral care (worked on brief standing tolerance during oral care with RW support and min A for balance unilaterally, needs seated rests between standing tasks)                                      Extremity/Trunk Assessment Upper Extremity Assessment Upper Extremity Assessment: Overall WFL for tasks assessed   Lower Extremity Assessment Lower Extremity Assessment: Generalized weakness;Defer to PT evaluation                 Communication Communication Communication: No apparent difficulties   Cognition Arousal: Alert Behavior During Therapy: [facility] for tasks assessed/performed     Orientation impairments: Time   Memory impairment (select all impairments): Short-term memory Attention impairment (select first level of impairment): Alternating attention, Divided attention Executive functioning impairment (select  all impairments): Organization, Problem solving OT - Cognition Comments: needs cues to maintain attention to 2 step ADL task and cues to initiate mobility due to delay in processing                  Following commands: Intact Following commands impaired: Follows one step commands with increased time      Cueing   Cueing Techniques: Verbal cues, Tactile cues        General Comments rests required between simple tasks    Pertinent Vitals/ Pain       Pain Assessment Pain Assessment: Faces Faces Pain Scale: Hurts little more Pain Location: "my legs" Pain Descriptors / Indicators: Discomfort, Grimacing Pain Intervention(s): Limited activity within patient's tolerance, Monitored during session, Repositioned, Relaxation   Frequency  Min 2X/week        Progress Toward Goals  OT Goals(current goals can now be found in the care plan section)  Progress towards OT goals: Progressing toward goals  Acute Rehab OT Goals OT Goal Formulation: With patient Time For Goal Achievement: 07/30/23 Potential to Achieve Goals: Good ADL Goals Pt Will Perform Grooming: with supervision;standing Pt Will Perform Lower Body Dressing: with supervision;sit to/from stand;sitting/lateral leans Pt Will Transfer to Toilet: with supervision;ambulating Pt Will Perform Toileting - Clothing Manipulation and hygiene: with supervision;sit to/from stand  Plan         AM-PAC OT "6 Clicks" Daily Activity     Outcome Measure   Help from another person eating meals?: A Little Help from another person taking care of personal grooming?: A Little Help from another person toileting, which includes using toliet, bedpan, or urinal?: A Little Help from another person bathing (including washing, rinsing, drying)?: A Little Help from another person to put on and taking off regular upper body clothing?: A Little Help from another person to put on and taking off regular lower body clothing?: A Lot 6 Click Score: 17    End of Session Equipment Utilized During Treatment: Rolling walker (2 wheels);Gait belt  OT Visit Diagnosis: Unsteadiness on feet (R26.81);History of falling (Z91.81);Muscle  weakness (generalized) (M62.81)   Activity Tolerance Patient limited by fatigue   Patient Left in bed;with call bell/phone within reach;with bed alarm set   Nurse Communication Mobility status        Time: 1610-9604 OT Time Calculation (min): 15 min  Charges: OT General Charges $OT Visit: 1 Visit OT Treatments $Self Care/Home Management : 8-22 mins  Kaysan Peixoto OT/L Acute Rehabilitation Department  [phone]  07/19/2023, 4:26 PM

## 2023-07-19 NOTE — Progress Notes (Signed)
 Physical Therapy Treatment Patient Details Name: Megan Baldwin MRN: [id number] DOB: [birth year] Today's Date: 07/19/2023   History of Present Illness 89 yo female admitted with acute metabolic encephalopathy, cystitis, acute PE, bil DVTs. PMH: anxiety, COPD, mild cognitive impairment, vitamin B12 deficiency, osteoarthritis, HLD, HTN, DDD, and osteoporosis.    PT Comments  Pt agreeable to working with therapy. She had a coughing spell at start of session that seemed to wear her out before mobilizing. Pt requested assistance to use the bathroom. Unfortunately, session time spent on assisting pt to bsc, toileting hygiene, changing gown/depends. She c/o some chest discomfort during session. She had some dyspnea as well. Pt fatigued so assisted her back to bed. Pt remains confused. Patient will benefit from continued inpatient follow up therapy, <3 hours/day    If plan is discharge home, recommend the following: A little help with bathing/dressing/bathroom;Assistance with cooking/housework;Assist for transportation;Help with stairs or ramp for entrance;A lot of help with walking and/or transfers   Can travel by private vehicle     No  Equipment Recommendations  None recommended by PT    Recommendations for Other Services       Precautions / Restrictions Precautions Precautions: Fall Precaution/Restrictions Comments: incontinent-has depends in room Restrictions Weight Bearing Restrictions Per Provider Order: No     Mobility  Bed Mobility Overal bed mobility: Needs Assistance Bed Mobility: Supine to Sit, Sit to Supine     Supine to sit: Contact guard, HOB elevated, Used rails Sit to supine: Contact guard assist, HOB elevated, Used rails   General bed mobility comments: increased time. heavy reliance on UEs, bedarail. cues provided.    Transfers Overall transfer level: Needs assistance Equipment used: Rolling walker (2 wheels) Transfers: Sit to/from Stand Sit to Stand: Min  assist   Step pivot transfers: Min assist       General transfer comment: cues for hand placement. assist to rise, steady, control descent. stand pivot using rw to bsc. Step pivot x 2 with RW. Pt very fatigued on today. Had coughing spell during session that seems to wear her out before mobilizing. Fall risk.    Ambulation/Gait               General Gait Details: Deferred on today due to having to assist pt on/off commode, change gown/depends, perform hygiene   Stairs             Wheelchair Mobility     Tilt Bed    Modified Rankin (Stroke Patients Only)       Balance Overall balance assessment: Needs assistance, History of Falls         Standing balance support: Bilateral upper extremity supported, Reliant on assistive device for balance, During functional activity Standing balance-Leahy Scale: Poor                              Communication Communication Communication: No apparent difficulties  Cognition Arousal: Alert Behavior During Therapy: WFL for tasks assessed/performed   PT - Cognitive impairments: No family/caregiver present to determine baseline, Memory, Problem solving, Orientation                       PT - Cognition Comments: pleasant. cooperative. Following commands: Intact Following commands impaired: Follows one step commands with increased time    Cueing Cueing Techniques: Verbal cues, Tactile cues  Exercises      General Comments  Pertinent Vitals/Pain Pain Assessment Pain Assessment: Faces Faces Pain Scale: Hurts even more Pain Location: L LE Pain Descriptors / Indicators: Discomfort, Grimacing Pain Intervention(s): Limited activity within patient's tolerance, Monitored during session, Repositioned    Home Living                          Prior Function            PT Goals (current goals can now be found in the care plan section) Progress towards PT goals: Progressing toward  goals    Frequency    Min 2X/week      PT Plan      Co-evaluation              AM-PAC PT "6 Clicks" Mobility   Outcome Measure  Help needed turning from your back to your side while in a flat bed without using bedrails?: A Little Help needed moving from lying on your back to sitting on the side of a flat bed without using bedrails?: A Little Help needed moving to and from a bed to a chair (including a wheelchair)?: A Little Help needed standing up from a chair using your arms (e.g., wheelchair or bedside chair)?: A Little Help needed to walk in hospital room?: A Lot Help needed climbing 3-5 steps with a railing? : Total 6 Click Score: 15    End of Session Equipment Utilized During Treatment: Gait belt Activity Tolerance: Patient tolerated treatment well;Patient limited by fatigue Patient left: in bed;with call bell/phone within reach;with bed alarm set   PT Visit Diagnosis: History of falling (Z91.81);Pain;Muscle weakness (generalized) (M62.81);Difficulty in walking, not elsewhere classified (R26.2);Unsteadiness on feet (R26.81) Pain - Right/Left: Right Pain - part of body: Leg     Time: 1610-9604 PT Time Calculation (min) (ACUTE ONLY): 37 min  Charges:    $Therapeutic Activity: 23-37 mins PT General Charges $$ ACUTE PT VISIT: 1 Visit                        Tanda Falter, PT Acute Rehabilitation  Office: [phone]

## 2023-07-20 DIAGNOSIS — I2699 Other pulmonary embolism without acute cor pulmonale: Secondary | ICD-10-CM

## 2023-07-20 DIAGNOSIS — I10 Essential (primary) hypertension: Secondary | ICD-10-CM

## 2023-07-20 DIAGNOSIS — G9341 Metabolic encephalopathy: Secondary | ICD-10-CM | POA: Diagnosis not present

## 2023-07-20 DIAGNOSIS — J449 Chronic obstructive pulmonary disease, unspecified: Secondary | ICD-10-CM

## 2023-07-20 DIAGNOSIS — I824Z3 Acute embolism and thrombosis of unspecified deep veins of distal lower extremity, bilateral: Secondary | ICD-10-CM | POA: Diagnosis not present

## 2023-07-20 LAB — BASIC METABOLIC PANEL WITH GFR
Anion gap: 7 (ref 5–15)
BUN: 17 mg/dL (ref 8–23)
CO2: 20 mmol/L — ABNORMAL LOW (ref 22–32)
Calcium: 7.9 mg/dL — ABNORMAL LOW (ref 8.9–10.3)
Chloride: 107 mmol/L (ref 98–111)
Creatinine, Ser: 0.9 mg/dL (ref 0.44–1.00)
GFR, Estimated: >60 mL/min (ref >60)
Glucose, Bld: 85 mg/dL (ref 70–99)
Potassium: 3.5 mmol/L (ref 3.5–5.1)
Sodium: 134 mmol/L — ABNORMAL LOW (ref 135–145)

## 2023-07-20 LAB — CBC
HCT: 27.6 % — ABNORMAL LOW (ref 36.0–46.0)
Hemoglobin: 8.7 g/dL — ABNORMAL LOW (ref 12.0–15.0)
MCH: 30.4 pg (ref 26.0–34.0)
MCHC: 31.5 g/dL (ref 30.0–36.0)
MCV: 96.5 fL (ref 80.0–100.0)
Platelets: 159 10*3/uL (ref 150–400)
RBC: 2.86 MIL/uL — ABNORMAL LOW (ref 3.87–5.11)
RDW: 16.4 % — ABNORMAL HIGH (ref 11.5–15.5)
WBC: 7.8 10*3/uL (ref 4.0–10.5)
nRBC: 0 % (ref 0.0–0.2)

## 2023-07-20 MED ORDER — AZITHROMYCIN 250 MG PO TABS
500.0000 mg | ORAL_TABLET | Freq: Every day | ORAL | Status: DC
  Administered 2023-07-20 – 2023-07-21 (×2): 500 mg via ORAL
  Filled 2023-07-20 (×2): qty 2

## 2023-07-20 NOTE — Subjective & Objective (Addendum)
 Pt seen and examined. She is eating breakfast. Stable. No issues overnight. Still awaiting PASSAR number.

## 2023-07-20 NOTE — Assessment & Plan Note (Addendum)
 Prior to 07-20-2023 - No chest pain, no respiratory/cardiopulmonary symptoms. - Eliquis  today. - Bilateral Dopplers revealed DVTs bilaterally today.  Daughter not surprised by this as she has had lower extremity swelling for a couple of weeks prior to admission.  Also had episode of chest pain about a week prior to admission according to family with some shortness of breath.  Resolved, so they didn't bring her to the ER. - Echo repeated this admit, no evidence of heart strain, EF good.    07-20-2023 stable on Eliquis . On RA.  07-21-2023 stable. On Eliquis . Will need 6 months of systemic anticoagulation treatment.  07-22-2023 stable on Eliquis . Will need 6 months of treatment with Eliquis .

## 2023-07-20 NOTE — Progress Notes (Signed)
 PROGRESS NOTE    Megan Baldwin  VHQ:[id number] DOB: 1933-04-08 DOA: 07/14/2023 PCP: Roslyn Coombe, MD  Subjective: Stable. Tolerating Eliquis . I called pt's dtr Evette on phone. Gave her update. Awaiting SNF placement. Awaiting PASSAR number.   Hospital Course: HPI: Megan Baldwin is a 89 y.o. female with medical history significant for anxiety, COPD, mild cognitive impairment, vitamin B12 deficiency, osteoarthritis, HLD, HTN, DDD, and osteoporosis who presented to EMS for evaluation of altered mental status. Patient altered so unable to give accurate history. Per daughter, patient's friend came by her house and noticed that patient was on the ground. Patient has history of recurrent falls and has fallen about 3 times in the past 2 months (daughter thinks this is likely due to patient not using her walker appropriately). Patient has been complaining of some left leg pain and over the last year, has had a decline in her cognition. During a recent MMSE by home health RN, patient was unable to recall the 3 items but does not have a formal diagnosis for dementia. At bedside, patient able to tell me her name, DOB and that she is currently in hospital but disoriented to time, and situation and thinks she is in the ER.  She denies any fevers, chills, abdominal pain, cough, SOB, nausea or vomiting.    Significant Events: Admitted 07/14/2023 for acute metabolic encephalopathy   Admission Labs: UA Moderate LE, WBC 21-50 Urine culture negative Na 134, K 4.0, CO2 of 21, BUN 21, Scr 1.01, glu 83 WBC 5.4, HgB 9.4, plt 69 TSH 0.9 NH3 < 13  Admission Imaging Studies: CXR Interval small amount of patchy density at the right lateral lung base, which could represent atelectasis or pneumonia. 2. Possible small right pleural effusion. 3. Stable mild cardiomegaly. CT head No CT evidence for acute intracranial abnormality. 2. Atrophy and advanced chronic small vessel ischemic changes of the white matter. Small  chronic appearing cortical/subcortical infarct at the left frontal vertex. Pelvic XR Advanced osteoarthritis in the hips bilaterally. No acute bony abnormality  Significant Labs:   Significant Imaging Studies: 07-16-2023 CTPA Positive for bilateral pulmonary PE with multifocal left lower lobe pulmonary infarcts. Trace left pleural effusion. RV/LV Ratio = 1.0 suggesting intermediate risk PE. Please refer to the "Code PE Focused" order set in EPIC.2.  Aortic Atherosclerosis (ICD10-I70.0).  Cholelithiasis.  Antibiotic Therapy: Anti-infectives (From admission, onward)    Start     Dose/Rate Route Frequency Ordered Stop   07/18/23 1300  azithromycin  (ZITHROMAX ) 500 mg in sodium chloride  0.9 % 250 mL IVPB        500 mg 250 mL/hr over 60 Minutes Intravenous Daily 07/18/23 1148     07/17/23 2000  cefTRIAXone  (ROCEPHIN ) 1 g in sodium chloride  0.9 % 100 mL IVPB        1 g 200 mL/hr over 30 Minutes Intravenous Every 24 hours 07/17/23 1802     07/15/23 1800  cefTRIAXone  (ROCEPHIN ) 1 g in sodium chloride  0.9 % 100 mL IVPB  Status:  Discontinued        1 g 200 mL/hr over 30 Minutes Intravenous Every 24 hours 07/14/23 2029 07/16/23 1252   07/14/23 1815  cefTRIAXone  (ROCEPHIN ) 1 g in sodium chloride  0.9 % 100 mL IVPB        1 g 200 mL/hr over 30 Minutes Intravenous  Once 07/14/23 1805 07/14/23 2059       Procedures:   Consultants: Cardiology Palliative care    Assessment and Plan: * Acute metabolic encephalopathy-resolved as  of 07/20/2023 Prior to 07-20-2023 - Initially presumed in the setting of UTI with underlying mild cognitive impairment and memory loss - Seems like she is rather having acceleration of the decline of her baseline cognition.  Believe she is at least mild if not moderate dementia. -Aricept   - maximize B12 stores in light of notable B12 deficiency-- repleted - Recommend outpatient neurology follow-up   07-20-2023 resolved. Pt is awake and alert now. Going to SNF.  Acute  pulmonary embolism without acute cor pulmonale (HCC) Prior to 07-20-2023 - No chest pain, no respiratory/cardiopulmonary symptoms. - Eliquis  today. - Bilateral Dopplers revealed DVTs bilaterally today.  Daughter not surprised by this as she has had lower extremity swelling for a couple of weeks prior to admission.  Also had episode of chest pain about a week prior to admission according to family with some shortness of breath.  Resolved, so they didn't bring her to the ER. - Echo repeated this admit, no evidence of heart strain, EF good.    07-20-2023 stable on Eliquis . On RA.  Lower leg DVT (deep venous thromboembolism), acute, bilateral (HCC) Prior to 07-20-2023. Bilateral LE U/S showed DVT in both legs  07-20-2023 continue with Eliquis .  Normocytic anemia Prior to 07-20-2023 Hgb stabilized at 9-10 -repleted B12   07-20-2023 stable. HgB 8.7. no iron deficiency  Iron/TIBC/Ferritin/ %Sat    Component Value Date/Time   IRON 36 07/15/2023 0353   TIBC 269 07/15/2023 0353   FERRITIN 166 07/15/2023 0353   IRONPCTSAT 13 07/15/2023 0353      Bilateral hip joint arthritis Prior to 07-20-2023 - Per daughter, patient has history of falls and has fallen about 3 times in the past 2 months - X-ray of the hips shows advanced osteoarthritis bilaterally but no acute bony abnormality - as noted above, it's unclear how long she was down at home with most recent fall - Pain control with scheduled Tylenol  and Voltaren  gel - PT/OT eval and treat - Fall precautions  07-20-2023 stable. On tylenol  prn. Awaiting SNF placement.  COPD (chronic obstructive pulmonary disease) (HCC) 07-20-2023 stable on prn duonebs.  Essential hypertension 07-20-2023 stable on norvasc .  Elevated troponin-resolved as of 07/20/2023 07-20-2023 due to acute PE. Resolved.  Acute cystitis without hematuria-resolved as of 07/20/2023 Prior to 07-20-2023 - Presented with altered mental status and concern for infection on  U/A.  Since admit, though, she's been afebrile, with no leukocytosis - culture is no growth but she does have fever so will treat with rocephin  x 5 days  07-20-2023 resolved.   DVT prophylaxis: Place TED hose Start: 07/14/23 2037 Place and maintain sequential compression device Start: 07/14/23 1911 apixaban  (ELIQUIS ) tablet 10 mg  apixaban  (ELIQUIS ) tablet 5 mg     Code Status: Full Code Family Communication: called pt's dtr Evette via phone Disposition Plan: SNF Reason for continuing need for hospitalization: medically stable today for DC to SNF. Awaiting PASSAR number.  Objective: Vitals:   07/19/23 1342 07/19/23 2126 07/20/23 0552 07/20/23 1323  BP: 128/72 129/69 127/66 120/61  Pulse: 92 82 79 84  Resp: (!) 22 20 18 18   Temp: 98.3 F (36.8 C) 99.1 F (37.3 C) 99.3 F (37.4 C) 98.4 F (36.9 C)  TempSrc: Oral Oral Oral Oral  SpO2: 98% 99% 100% 100%  Weight:      Height:        Intake/Output Summary (Last 24 hours) at 07/20/2023 1615 Last data filed at 07/20/2023 1400 Gross per 24 hour  Intake 920 ml  Output 175 ml  Net 745 ml   Filed Weights   07/14/23 1614  Weight: 68 kg    Examination:  Physical Exam Vitals and nursing note reviewed.  HENT:     Head: Normocephalic and atraumatic.     Nose: Nose normal.  Eyes:     General: No scleral icterus. Cardiovascular:     Rate and Rhythm: Normal rate and regular rhythm.  Pulmonary:     Effort: Pulmonary effort is normal.     Breath sounds: Normal breath sounds.  Abdominal:     General: Bowel sounds are normal.     Palpations: Abdomen is soft.  Musculoskeletal:     Right lower leg: Edema present.     Left lower leg: Edema present.     Comments: Trace pretibial edema bilaterally  Skin:    General: Skin is warm and dry.     Capillary Refill: Capillary refill takes less than 2 seconds.  Neurological:     Mental Status: She is alert.     Data Reviewed: I have personally reviewed following labs and imaging  studies  CBC: Recent Labs  Lab 07/14/23 1648 07/14/23 1704 07/16/23 1409 07/17/23 0705 07/18/23 0350 07/19/23 0410 07/20/23 0421  WBC 5.4   < > 7.0 8.7 12.3* 10.0 7.8  NEUTROABS 3.4  --   --   --   --   --   --   HGB 9.4*   < > 9.5* 9.7* 9.0* 9.0* 8.7*  HCT 29.2*   < > 31.1* 31.7* 28.0* 28.8* 27.6*  MCV 93.6   < > 98.7 96.9 94.0 95.4 96.5  PLT 69*   < > 96* 106* 113* 134* 159   < > = values in this interval not displayed.   Basic Metabolic Panel: Recent Labs  Lab 07/16/23 0107 07/16/23 1409 07/17/23 0705 07/18/23 0350 07/20/23 0421  NA 132* 135 135 132* 134*  K 3.7 4.2 3.9 3.7 3.5  CL 102 107 106 103 107  CO2 20* 18* 22 19* 20*  GLUCOSE 107* 120* 99 95 85  BUN 17 16 15 12 17   CREATININE 0.93 0.95 0.86 0.84 0.90  CALCIUM  8.8* 8.6* 8.5* 8.2* 7.9*   GFR: Estimated Creatinine Clearance: 40 mL/min (by C-G formula based on SCr of 0.9 mg/dL). Liver Function Tests: Recent Labs  Lab 07/14/23 1648  AST 28  ALT 17  ALKPHOS 65  BILITOT 1.0  PROT 7.5  ALBUMIN 3.6   Recent Labs  Lab 07/14/23 1648  AMMONIA <13   Coagulation Profile: Recent Labs  Lab 07/16/23 0609  INR 1.4*   BNP (last 3 results) Recent Labs    07/16/23 0107  BNP 139.4*   Anemia Panel: Recent Labs    07/18/23 0849  VITAMINB12 1,205*   Sepsis Labs: Recent Labs  Lab 07/14/23 1659  LATICACIDVEN 1.4    Recent Results (from the past 240 hours)  Urine Culture     Status: None   Collection Time: 07/14/23  4:42 PM   Specimen: Urine, Random  Result Value Ref Range Status   Specimen Description   Final    URINE, RANDOM Performed at [facility], [address] [place], [place] [address]    Special Requests   Final    NONE Reflexed from [id number] Performed at [facility], [address] [place], [place] [address]    Culture   Final    NO GROWTH Performed at [facility], [address] [place], [place] [address]    Report Status  07/15/2023 FINAL  Final  Culture, blood (Routine X 2) w Reflex to ID Panel     Status: None (Preliminary result)   Collection Time: 07/17/23  6:44 PM   Specimen: BLOOD  Result Value Ref Range Status   Specimen Description   Final    BLOOD BLOOD LEFT HAND Performed at [facility], [address], [place], [place] [address]    Special Requests   Final    BOTTLES DRAWN AEROBIC AND ANAEROBIC Blood Culture results may not be optimal due to an inadequate volume of blood received in culture bottles Performed at [facility], [address], [place], [place] [address]    Culture   Final    NO GROWTH 3 DAYS Performed at [facility], [address], [place], [place] [address]    Report Status PENDING  Incomplete  Culture, blood (Routine X 2) w Reflex to ID Panel     Status: None (Preliminary result)   Collection Time: 07/17/23  6:44 PM   Specimen: BLOOD  Result Value Ref Range Status   Specimen Description   Final    BLOOD BLOOD LEFT HAND Performed at [facility], [address], [place], [place] [address]    Special Requests   Final    Blood Culture results may not be optimal due to an inadequate volume of blood received in culture bottles BOTTLES DRAWN AEROBIC AND ANAEROBIC Performed at [facility], [address], [place], [place] [address]    Culture   Final    NO GROWTH 3 DAYS Performed at [facility], [address], [place], [place] [address]    Report Status PENDING  Incomplete    Scheduled Meds:  acetaminophen   1,000 mg Oral TID   amLODipine   5 mg Oral Daily   apixaban   10 mg Oral BID   Followed by   Cecily Cohen ON 07/24/2023] apixaban   5 mg Oral BID   azithromycin   500 mg Oral Daily   diclofenac  Sodium  2 g Topical QID   donepezil   5 mg Oral QHS   feeding supplement  237 mL Oral BID BM   rosuvastatin   10 mg Oral Daily   Continuous Infusions:   LOS: 6 days   Time spent: 50  minutes  Unk Garb, DO  [facility]  07/20/2023, 4:15 PM

## 2023-07-20 NOTE — Assessment & Plan Note (Addendum)
 Prior to 07-20-2023 Hgb stabilized at 9-10 -repleted B12   07-20-2023 stable. HgB 8.7. no iron deficiency  Iron/TIBC/Ferritin/ %Sat    Component Value Date/Time   IRON 36 07/15/2023 0353   TIBC 269 07/15/2023 0353   FERRITIN 166 07/15/2023 0353   IRONPCTSAT 13 07/15/2023 0353   07-21-2023 stable

## 2023-07-20 NOTE — Assessment & Plan Note (Addendum)
 07-20-2023 stable on norvasc .  07-21-2023 stable  07-22-2023 stable.

## 2023-07-20 NOTE — Assessment & Plan Note (Signed)
 Prior to 07-20-2023 - Presented with altered mental status and concern for infection on U/A.  Since admit, though, she's been afebrile, with no leukocytosis - culture is no growth but she does have fever so will treat with rocephin  x 5 days  07-20-2023 resolved.

## 2023-07-20 NOTE — Assessment & Plan Note (Signed)
 07-20-2023 due to acute PE. Resolved.

## 2023-07-20 NOTE — Assessment & Plan Note (Signed)
 Prior to 07-20-2023 - Initially presumed in the setting of UTI with underlying mild cognitive impairment and memory loss - Seems like she is rather having acceleration of the decline of her baseline cognition.  Believe she is at least mild if not moderate dementia. -Aricept   - maximize B12 stores in light of notable B12 deficiency-- repleted - Recommend outpatient neurology follow-up   07-20-2023 resolved. Pt is awake and alert now. Going to SNF.

## 2023-07-20 NOTE — Assessment & Plan Note (Addendum)
 Prior to 07-20-2023. Bilateral LE U/S showed DVT in both legs  07-20-2023 continue with Eliquis .  07-21-2023 stable. Will need 6 months of systemic anticoagulation treatment.  07-22-2023 stable

## 2023-07-20 NOTE — Assessment & Plan Note (Addendum)
 Prior to 07-20-2023 - Per daughter, patient has history of falls and has fallen about 3 times in the past 2 months - X-ray of the hips shows advanced osteoarthritis bilaterally but no acute bony abnormality - as noted above, it's unclear how long she was down at home with most recent fall - Pain control with scheduled Tylenol  and Voltaren  gel - PT/OT eval and treat - Fall precautions  07-20-2023 stable. On tylenol  prn. Awaiting SNF placement.  07-22-2023 stable  07-21-2023 stable

## 2023-07-20 NOTE — Hospital Course (Signed)
 HPI: Megan Baldwin is a 89 y.o. female with medical history significant for anxiety, COPD, mild cognitive impairment, vitamin B12 deficiency, osteoarthritis, HLD, HTN, DDD, and osteoporosis who presented to EMS for evaluation of altered mental status. Patient altered so unable to give accurate history. Per daughter, patient's friend came by her house and noticed that patient was on the ground. Patient has history of recurrent falls and has fallen about 3 times in the past 2 months (daughter thinks this is likely due to patient not using her walker appropriately). Patient has been complaining of some left leg pain and over the last year, has had a decline in her cognition. During a recent MMSE by home health RN, patient was unable to recall the 3 items but does not have a formal diagnosis for dementia. At bedside, patient able to tell me her name, DOB and that she is currently in hospital but disoriented to time, and situation and thinks she is in the ER.  She denies any fevers, chills, abdominal pain, cough, SOB, nausea or vomiting.    Significant Events: Admitted 07/14/2023 for acute metabolic encephalopathy   Admission Labs: UA Moderate LE, WBC 21-50 Urine culture negative Na 134, K 4.0, CO2 of 21, BUN 21, Scr 1.01, glu 83 WBC 5.4, HgB 9.4, plt 69 TSH 0.9 NH3 < 13  Admission Imaging Studies: CXR Interval small amount of patchy density at the right lateral lung base, which could represent atelectasis or pneumonia. 2. Possible small right pleural effusion. 3. Stable mild cardiomegaly. CT head No CT evidence for acute intracranial abnormality. 2. Atrophy and advanced chronic small vessel ischemic changes of the white matter. Small chronic appearing cortical/subcortical infarct at the left frontal vertex. Pelvic XR Advanced osteoarthritis in the hips bilaterally. No acute bony abnormality  Significant Labs:   Significant Imaging Studies: 07-16-2023 CTPA Positive for bilateral pulmonary PE with  multifocal left lower lobe pulmonary infarcts. Trace left pleural effusion. RV/LV Ratio = 1.0 suggesting intermediate risk PE. Please refer to the "Code PE Focused" order set in EPIC.2.  Aortic Atherosclerosis (ICD10-I70.0).  Cholelithiasis.  Antibiotic Therapy: Anti-infectives (From admission, onward)    Start     Dose/Rate Route Frequency Ordered Stop   07/18/23 1300  azithromycin  (ZITHROMAX ) 500 mg in sodium chloride  0.9 % 250 mL IVPB        500 mg 250 mL/hr over 60 Minutes Intravenous Daily 07/18/23 1148     07/17/23 2000  cefTRIAXone  (ROCEPHIN ) 1 g in sodium chloride  0.9 % 100 mL IVPB        1 g 200 mL/hr over 30 Minutes Intravenous Every 24 hours 07/17/23 1802     07/15/23 1800  cefTRIAXone  (ROCEPHIN ) 1 g in sodium chloride  0.9 % 100 mL IVPB  Status:  Discontinued        1 g 200 mL/hr over 30 Minutes Intravenous Every 24 hours 07/14/23 2029 07/16/23 1252   07/14/23 1815  cefTRIAXone  (ROCEPHIN ) 1 g in sodium chloride  0.9 % 100 mL IVPB        1 g 200 mL/hr over 30 Minutes Intravenous  Once 07/14/23 1805 07/14/23 2059       Procedures:   Consultants: Cardiology Palliative care

## 2023-07-20 NOTE — TOC Progression Note (Signed)
 Transition of Care [facility]) - Progression Note    Patient Details  Name: Megan Baldwin MRN: [id number] Date of Birth: 1933/07/19  Transition of Care [facility]) CM/SW Contact  Gertha Ku, LCSW Phone Number: 07/20/2023, 9:15 AM  Clinical Narrative:     The pt's daughter has chosen [employer] for her mother's placement. The pt's PASRR is still pending. CSW sent a message to [facility] at [facility]; she will begin the insurance authorization once the pt is medically stable and the PASRR is assigned. TOC to follow.   Expected Discharge Plan: Skilled Nursing Facility Barriers to Discharge: Continued Medical Work up, English as a second language teacher, Engineer, mining)  Expected Discharge Plan and Services                                               Social Determinants of Health (SDOH) Interventions SDOH Screenings   Food Insecurity: Patient Unable To Answer (07/14/2023)  Housing: Unknown (07/08/2023)  Transportation Needs: No Transportation Needs (07/08/2023)  Utilities: Not At Risk (07/08/2023)  Depression (PHQ2-9): Low Risk  (07/08/2023)  Tobacco Use: Medium Risk (07/14/2023)    Readmission Risk Interventions     No data to display

## 2023-07-20 NOTE — Assessment & Plan Note (Addendum)
 07-20-2023 stable on prn duonebs.  07-21-2023 stable  07-22-2023 stable

## 2023-07-21 DIAGNOSIS — I10 Essential (primary) hypertension: Secondary | ICD-10-CM | POA: Diagnosis not present

## 2023-07-21 DIAGNOSIS — I2699 Other pulmonary embolism without acute cor pulmonale: Secondary | ICD-10-CM | POA: Diagnosis not present

## 2023-07-21 DIAGNOSIS — M160 Bilateral primary osteoarthritis of hip: Secondary | ICD-10-CM | POA: Diagnosis not present

## 2023-07-21 DIAGNOSIS — J449 Chronic obstructive pulmonary disease, unspecified: Secondary | ICD-10-CM | POA: Diagnosis not present

## 2023-07-21 LAB — CBC
HCT: 27.9 % — ABNORMAL LOW (ref 36.0–46.0)
Hemoglobin: 8.5 g/dL — ABNORMAL LOW (ref 12.0–15.0)
MCH: 29.5 pg (ref 26.0–34.0)
MCHC: 30.5 g/dL (ref 30.0–36.0)
MCV: 96.9 fL (ref 80.0–100.0)
Platelets: 179 10*3/uL (ref 150–400)
RBC: 2.88 MIL/uL — ABNORMAL LOW (ref 3.87–5.11)
RDW: 16.4 % — ABNORMAL HIGH (ref 11.5–15.5)
WBC: 4.7 10*3/uL (ref 4.0–10.5)
nRBC: 0 % (ref 0.0–0.2)

## 2023-07-21 NOTE — TOC Progression Note (Signed)
 Transition of Care [facility]) - Progression Note    Patient Details  Name: Megan Baldwin MRN: [id number] Date of Birth: 01-Sep-1933  Transition of Care [facility]) CM/SW Contact  Gertha Ku, LCSW Phone Number: 07/21/2023, 8:38 AM  Clinical Narrative:     Pt's PASRR is pending , with QHMP assigned for review. Auth pending. TOC to follow.  Expected Discharge Plan: Skilled Nursing Facility Barriers to Discharge: Continued Medical Work up, English as a second language teacher, Engineer, mining)  Expected Discharge Plan and Services                                               Social Determinants of Health (SDOH) Interventions SDOH Screenings   Food Insecurity: Patient Unable To Answer (07/14/2023)  Housing: Unknown (07/08/2023)  Transportation Needs: No Transportation Needs (07/08/2023)  Utilities: Not At Risk (07/08/2023)  Depression (PHQ2-9): Low Risk  (07/08/2023)  Tobacco Use: Medium Risk (07/14/2023)    Readmission Risk Interventions     No data to display

## 2023-07-21 NOTE — Progress Notes (Signed)
 PROGRESS NOTE    Megan Baldwin  ZOX:[id number] DOB: 07/18/1933 DOA: 07/14/2023 PCP: Roslyn Coombe, MD  Subjective: Stable. No issues overnight. Still awaiting PASSAR number.   Hospital Course: HPI: Megan Baldwin is a 89 y.o. female with medical history significant for anxiety, COPD, mild cognitive impairment, vitamin B12 deficiency, osteoarthritis, HLD, HTN, DDD, and osteoporosis who presented to EMS for evaluation of altered mental status. Patient altered so unable to give accurate history. Per daughter, patient's friend came by her house and noticed that patient was on the ground. Patient has history of recurrent falls and has fallen about 3 times in the past 2 months (daughter thinks this is likely due to patient not using her walker appropriately). Patient has been complaining of some left leg pain and over the last year, has had a decline in her cognition. During a recent MMSE by home health RN, patient was unable to recall the 3 items but does not have a formal diagnosis for dementia. At bedside, patient able to tell me her name, DOB and that she is currently in hospital but disoriented to time, and situation and thinks she is in the ER.  She denies any fevers, chills, abdominal pain, cough, SOB, nausea or vomiting.    Significant Events: Admitted 07/14/2023 for acute metabolic encephalopathy   Admission Labs: UA Moderate LE, WBC 21-50 Urine culture negative Na 134, K 4.0, CO2 of 21, BUN 21, Scr 1.01, glu 83 WBC 5.4, HgB 9.4, plt 69 TSH 0.9 NH3 < 13  Admission Imaging Studies: CXR Interval small amount of patchy density at the right lateral lung base, which could represent atelectasis or pneumonia. 2. Possible small right pleural effusion. 3. Stable mild cardiomegaly. CT head No CT evidence for acute intracranial abnormality. 2. Atrophy and advanced chronic small vessel ischemic changes of the white matter. Small chronic appearing cortical/subcortical infarct at the left frontal  vertex. Pelvic XR Advanced osteoarthritis in the hips bilaterally. No acute bony abnormality  Significant Labs:   Significant Imaging Studies: 07-16-2023 CTPA Positive for bilateral pulmonary PE with multifocal left lower lobe pulmonary infarcts. Trace left pleural effusion. RV/LV Ratio = 1.0 suggesting intermediate risk PE. Please refer to the "Code PE Focused" order set in EPIC.2.  Aortic Atherosclerosis (ICD10-I70.0).  Cholelithiasis.  Antibiotic Therapy: Anti-infectives (From admission, onward)    Start     Dose/Rate Route Frequency Ordered Stop   07/18/23 1300  azithromycin  (ZITHROMAX ) 500 mg in sodium chloride  0.9 % 250 mL IVPB        500 mg 250 mL/hr over 60 Minutes Intravenous Daily 07/18/23 1148     07/17/23 2000  cefTRIAXone  (ROCEPHIN ) 1 g in sodium chloride  0.9 % 100 mL IVPB        1 g 200 mL/hr over 30 Minutes Intravenous Every 24 hours 07/17/23 1802     07/15/23 1800  cefTRIAXone  (ROCEPHIN ) 1 g in sodium chloride  0.9 % 100 mL IVPB  Status:  Discontinued        1 g 200 mL/hr over 30 Minutes Intravenous Every 24 hours 07/14/23 2029 07/16/23 1252   07/14/23 1815  cefTRIAXone  (ROCEPHIN ) 1 g in sodium chloride  0.9 % 100 mL IVPB        1 g 200 mL/hr over 30 Minutes Intravenous  Once 07/14/23 1805 07/14/23 2059       Procedures:   Consultants: Cardiology Palliative care    Assessment and Plan: * Acute metabolic encephalopathy-resolved as of 07/20/2023 Prior to 07-20-2023 - Initially presumed in the setting  of UTI with underlying mild cognitive impairment and memory loss - Seems like she is rather having acceleration of the decline of her baseline cognition.  Believe she is at least mild if not moderate dementia. -Aricept   - maximize B12 stores in light of notable B12 deficiency-- repleted - Recommend outpatient neurology follow-up   07-20-2023 resolved. Pt is awake and alert now. Going to SNF.  Acute pulmonary embolism without acute cor pulmonale (HCC) Prior to  07-20-2023 - No chest pain, no respiratory/cardiopulmonary symptoms. - Eliquis  today. - Bilateral Dopplers revealed DVTs bilaterally today.  Daughter not surprised by this as she has had lower extremity swelling for a couple of weeks prior to admission.  Also had episode of chest pain about a week prior to admission according to family with some shortness of breath.  Resolved, so they didn't bring her to the ER. - Echo repeated this admit, no evidence of heart strain, EF good.    07-20-2023 stable on Eliquis . On RA.  07-21-2023 stable. On Eliquis . Will need 6 months of systemic anticoagulation treatment.  Lower leg DVT (deep venous thromboembolism), acute, bilateral (HCC) Prior to 07-20-2023. Bilateral LE U/S showed DVT in both legs  07-20-2023 continue with Eliquis .  07-21-2023 stable. Will need 6 months of systemic anticoagulation treatment.  Normocytic anemia Prior to 07-20-2023 Hgb stabilized at 9-10 -repleted B12   07-20-2023 stable. HgB 8.7. no iron deficiency  Iron/TIBC/Ferritin/ %Sat    Component Value Date/Time   IRON 36 07/15/2023 0353   TIBC 269 07/15/2023 0353   FERRITIN 166 07/15/2023 0353   IRONPCTSAT 13 07/15/2023 0353   07-21-2023 stable  Bilateral hip joint arthritis Prior to 07-20-2023 - Per daughter, patient has history of falls and has fallen about 3 times in the past 2 months - X-ray of the hips shows advanced osteoarthritis bilaterally but no acute bony abnormality - as noted above, it's unclear how long she was down at home with most recent fall - Pain control with scheduled Tylenol  and Voltaren  gel - PT/OT eval and treat - Fall precautions  07-20-2023 stable. On tylenol  prn. Awaiting SNF placement.  07-21-2023 stable  COPD (chronic obstructive pulmonary disease) (HCC) 07-20-2023 stable on prn duonebs.  07-21-2023 stable  Essential hypertension 07-20-2023 stable on norvasc .  07-21-2023 stable  Elevated troponin-resolved as of  07/20/2023 07-20-2023 due to acute PE. Resolved.  Acute cystitis without hematuria-resolved as of 07/20/2023 Prior to 07-20-2023 - Presented with altered mental status and concern for infection on U/A.  Since admit, though, she's been afebrile, with no leukocytosis - culture is no growth but she does have fever so will treat with rocephin  x 5 days  07-20-2023 resolved.   DVT prophylaxis: Place TED hose Start: 07/14/23 2037 Place and maintain sequential compression device Start: 07/14/23 1911 apixaban  (ELIQUIS ) tablet 10 mg  apixaban  (ELIQUIS ) tablet 5 mg     Code Status: Full Code Family Communication: no family at bedside. Spoke to pt's dtr Evette yesterday by phone Disposition Plan: SNF Reason for continuing need for hospitalization: medically stable for DC. Awaiting PASSAR number, insurance authorization, etc.  Objective: Vitals:   07/20/23 0552 07/20/23 1323 07/20/23 2110 07/21/23 0531  BP: 127/66 120/61 100/86 139/79  Pulse: 79 84 82 81  Resp: 18 18 18 18   Temp: 99.3 F (37.4 C) 98.4 F (36.9 C) 98.6 F (37 C) 98 F (36.7 C)  TempSrc: Oral Oral Oral Oral  SpO2: 100% 100% 100% 100%  Weight:      Height:  Intake/Output Summary (Last 24 hours) at 07/21/2023 1043 Last data filed at 07/21/2023 0900 Gross per 24 hour  Intake 320 ml  Output 150 ml  Net 170 ml   Filed Weights   07/14/23 1614  Weight: 68 kg   Examination:  Physical Exam Vitals and nursing note reviewed.  Constitutional:      General: She is not in acute distress.    Appearance: She is not toxic-appearing or diaphoretic.  HENT:     Head: Normocephalic and atraumatic.  Cardiovascular:     Rate and Rhythm: Normal rate and regular rhythm.  Pulmonary:     Effort: Pulmonary effort is normal.     Breath sounds: Normal breath sounds.  Abdominal:     General: Bowel sounds are normal. There is no distension.     Palpations: Abdomen is soft.  Skin:    General: Skin is warm and dry.     Capillary  Refill: Capillary refill takes less than 2 seconds.   Data Reviewed: I have personally reviewed following labs and imaging studies  CBC: Recent Labs  Lab 07/14/23 1648 07/14/23 1704 07/17/23 0705 07/18/23 0350 07/19/23 0410 07/20/23 0421 07/21/23 0412  WBC 5.4   < > 8.7 12.3* 10.0 7.8 4.7  NEUTROABS 3.4  --   --   --   --   --   --   HGB 9.4*   < > 9.7* 9.0* 9.0* 8.7* 8.5*  HCT 29.2*   < > 31.7* 28.0* 28.8* 27.6* 27.9*  MCV 93.6   < > 96.9 94.0 95.4 96.5 96.9  PLT 69*   < > 106* 113* 134* 159 179   < > = values in this interval not displayed.   Basic Metabolic Panel: Recent Labs  Lab 07/16/23 0107 07/16/23 1409 07/17/23 0705 07/18/23 0350 07/20/23 0421  NA 132* 135 135 132* 134*  K 3.7 4.2 3.9 3.7 3.5  CL 102 107 106 103 107  CO2 20* 18* 22 19* 20*  GLUCOSE 107* 120* 99 95 85  BUN 17 16 15 12 17   CREATININE 0.93 0.95 0.86 0.84 0.90  CALCIUM  8.8* 8.6* 8.5* 8.2* 7.9*   GFR: Estimated Creatinine Clearance: 40 mL/min (by C-G formula based on SCr of 0.9 mg/dL). Liver Function Tests: Recent Labs  Lab 07/14/23 1648  AST 28  ALT 17  ALKPHOS 65  BILITOT 1.0  PROT 7.5  ALBUMIN 3.6    Recent Labs  Lab 07/14/23 1648  AMMONIA <13   Coagulation Profile: Recent Labs  Lab 07/16/23 0609  INR 1.4*   BNP (last 3 results) Recent Labs    07/16/23 0107  BNP 139.4*   Sepsis Labs: Recent Labs  Lab 07/14/23 1659  LATICACIDVEN 1.4    Recent Results (from the past 240 hours)  Urine Culture     Status: None   Collection Time: 07/14/23  4:42 PM   Specimen: Urine, Random  Result Value Ref Range Status   Specimen Description   Final    URINE, RANDOM Performed at [facility], [address] [place], [place] [address]    Special Requests   Final    NONE Reflexed from [id number] Performed at [facility], [address] [place], [place] [address]    Culture   Final    NO GROWTH Performed at [facility], [address], [place], [place] [address]    Report Status 07/15/2023 FINAL  Final  Culture, blood (Routine X 2) w  Reflex to ID Panel     Status: None (Preliminary result)   Collection Time: 07/17/23  6:44 PM   Specimen: BLOOD  Result Value Ref Range Status   Specimen Description   Final    BLOOD BLOOD LEFT HAND Performed at [facility], [address] [place], [place] [address]    Special Requests   Final    BOTTLES DRAWN AEROBIC AND ANAEROBIC Blood Culture results may not be optimal due to an inadequate volume of blood received in culture bottles Performed at [facility], [address] [place], [place] [address]    Culture   Final    NO GROWTH 4 DAYS Performed at [facility], [address], [place], [place] [address]    Report Status PENDING  Incomplete  Culture, blood (Routine X 2) w Reflex to ID Panel     Status: None (Preliminary result)   Collection Time: 07/17/23  6:44 PM   Specimen: BLOOD  Result Value Ref Range Status   Specimen Description   Final    BLOOD BLOOD LEFT HAND Performed at [facility], [address], [place], [place] [address]    Special Requests   Final    Blood Culture results may not be optimal due to an inadequate volume of blood received in culture bottles BOTTLES DRAWN AEROBIC AND ANAEROBIC Performed at [facility], [address] [place], [place] [address]    Culture   Final    NO GROWTH 4 DAYS Performed at [facility], [address] [place], [place] [address]    Report Status PENDING  Incomplete   Scheduled Meds:  acetaminophen   1,000 mg Oral TID   amLODipine   5 mg Oral Daily   apixaban   10 mg Oral BID   Followed by   Cecily Cohen ON 07/24/2023] apixaban   5 mg Oral BID   azithromycin   500 mg Oral Daily   diclofenac  Sodium  2 g Topical QID   donepezil   5 mg Oral QHS   feeding supplement  237 mL Oral BID BM   rosuvastatin   10 mg Oral Daily   Continuous  Infusions:   LOS: 7 days   Time spent: 50 minutes  Unk Garb, DO  [facility]  07/21/2023, 10:43 AM

## 2023-07-21 NOTE — Progress Notes (Signed)
 PT Cancellation Note  Patient Details Name: Megan Baldwin MRN: [id number] DOB: 1933/11/02   Cancelled Treatment:     attempted to see twice today.  First, eating lunch "Can you come back later?".  Second, Pt appeared anxious repeating "I can't right now".  Pt has been evaluated with rec for SNF.  Rehab Team to continue to follow during her Acute Care stay.   Bess Broody  PTA Acute  Rehabilitation Services Office M-F          [phone]

## 2023-07-21 NOTE — Plan of Care (Signed)
 Pt has been very concerned abt taking "so much medicine" Pt would like to request tylenol  Abx d/c'd  Problem: Education: Goal: Knowledge of General Education information will improve Description: Including pain rating scale, medication(s)/side effects and non-pharmacologic comfort measures Outcome: Progressing   Problem: Clinical Measurements: Goal: Ability to maintain clinical measurements within normal limits will improve Outcome: Progressing   Problem: Clinical Measurements: Goal: Respiratory complications will improve Outcome: Progressing

## 2023-07-22 DIAGNOSIS — I10 Essential (primary) hypertension: Secondary | ICD-10-CM | POA: Diagnosis not present

## 2023-07-22 DIAGNOSIS — I824Z3 Acute embolism and thrombosis of unspecified deep veins of distal lower extremity, bilateral: Secondary | ICD-10-CM | POA: Diagnosis not present

## 2023-07-22 DIAGNOSIS — N183 Chronic kidney disease, stage 3 unspecified: Secondary | ICD-10-CM | POA: Diagnosis not present

## 2023-07-22 DIAGNOSIS — M5030 Other cervical disc degeneration, unspecified cervical region: Secondary | ICD-10-CM | POA: Diagnosis not present

## 2023-07-22 DIAGNOSIS — R278 Other lack of coordination: Secondary | ICD-10-CM | POA: Diagnosis not present

## 2023-07-22 DIAGNOSIS — M51369 Other intervertebral disc degeneration, lumbar region without mention of lumbar back pain or lower extremity pain: Secondary | ICD-10-CM | POA: Diagnosis not present

## 2023-07-22 DIAGNOSIS — M160 Bilateral primary osteoarthritis of hip: Secondary | ICD-10-CM | POA: Diagnosis not present

## 2023-07-22 DIAGNOSIS — F0394 Unspecified dementia, unspecified severity, with anxiety: Secondary | ICD-10-CM | POA: Diagnosis not present

## 2023-07-22 DIAGNOSIS — N3000 Acute cystitis without hematuria: Secondary | ICD-10-CM | POA: Diagnosis not present

## 2023-07-22 DIAGNOSIS — I129 Hypertensive chronic kidney disease with stage 1 through stage 4 chronic kidney disease, or unspecified chronic kidney disease: Secondary | ICD-10-CM | POA: Diagnosis not present

## 2023-07-22 DIAGNOSIS — I351 Nonrheumatic aortic (valve) insufficiency: Secondary | ICD-10-CM | POA: Diagnosis not present

## 2023-07-22 DIAGNOSIS — Z9181 History of falling: Secondary | ICD-10-CM | POA: Diagnosis not present

## 2023-07-22 DIAGNOSIS — E538 Deficiency of other specified B group vitamins: Secondary | ICD-10-CM | POA: Diagnosis not present

## 2023-07-22 DIAGNOSIS — I82503 Chronic embolism and thrombosis of unspecified deep veins of lower extremity, bilateral: Secondary | ICD-10-CM | POA: Diagnosis not present

## 2023-07-22 DIAGNOSIS — G9341 Metabolic encephalopathy: Secondary | ICD-10-CM | POA: Diagnosis not present

## 2023-07-22 DIAGNOSIS — F419 Anxiety disorder, unspecified: Secondary | ICD-10-CM | POA: Diagnosis not present

## 2023-07-22 DIAGNOSIS — J449 Chronic obstructive pulmonary disease, unspecified: Secondary | ICD-10-CM | POA: Diagnosis not present

## 2023-07-22 DIAGNOSIS — R41841 Cognitive communication deficit: Secondary | ICD-10-CM | POA: Diagnosis not present

## 2023-07-22 DIAGNOSIS — I2699 Other pulmonary embolism without acute cor pulmonale: Secondary | ICD-10-CM | POA: Diagnosis not present

## 2023-07-22 DIAGNOSIS — G3184 Mild cognitive impairment, so stated: Secondary | ICD-10-CM | POA: Diagnosis not present

## 2023-07-22 DIAGNOSIS — R262 Difficulty in walking, not elsewhere classified: Secondary | ICD-10-CM | POA: Diagnosis not present

## 2023-07-22 DIAGNOSIS — Z743 Need for continuous supervision: Secondary | ICD-10-CM | POA: Diagnosis not present

## 2023-07-22 DIAGNOSIS — R410 Disorientation, unspecified: Secondary | ICD-10-CM | POA: Diagnosis not present

## 2023-07-22 DIAGNOSIS — Z7401 Bed confinement status: Secondary | ICD-10-CM | POA: Diagnosis not present

## 2023-07-22 DIAGNOSIS — I2782 Chronic pulmonary embolism: Secondary | ICD-10-CM | POA: Diagnosis not present

## 2023-07-22 DIAGNOSIS — M810 Age-related osteoporosis without current pathological fracture: Secondary | ICD-10-CM | POA: Diagnosis not present

## 2023-07-22 DIAGNOSIS — E785 Hyperlipidemia, unspecified: Secondary | ICD-10-CM | POA: Diagnosis not present

## 2023-07-22 LAB — CULTURE, BLOOD (ROUTINE X 2)
Culture: NO GROWTH
Culture: NO GROWTH

## 2023-07-22 MED ORDER — DONEPEZIL HCL 5 MG PO TABS: 5.0000 mg | ORAL_TABLET | Freq: Every day | ORAL | Status: AC

## 2023-07-22 MED ORDER — ACETAMINOPHEN 500 MG PO TABS: 1000.0000 mg | ORAL_TABLET | Freq: Three times a day (TID) | ORAL | Status: AC

## 2023-07-22 MED ORDER — SENNOSIDES-DOCUSATE SODIUM 8.6-50 MG PO TABS: 1.0000 | ORAL_TABLET | Freq: Every evening | ORAL | Status: AC | PRN

## 2023-07-22 MED ORDER — TRAMADOL HCL 50 MG PO TABS: 50.0000 mg | ORAL_TABLET | Freq: Four times a day (QID) | ORAL | 0 refills | Status: DC | PRN

## 2023-07-22 MED ORDER — IPRATROPIUM-ALBUTEROL 0.5-2.5 (3) MG/3ML IN SOLN: 3.0000 mL | Freq: Four times a day (QID) | RESPIRATORY_TRACT | Status: DC | PRN

## 2023-07-22 MED ORDER — DICLOFENAC SODIUM 1 % EX GEL: 2.0000 g | Freq: Four times a day (QID) | CUTANEOUS | Status: DC

## 2023-07-22 MED ORDER — ROSUVASTATIN CALCIUM 10 MG PO TABS
10.0000 mg | ORAL_TABLET | Freq: Every day | ORAL | Status: DC
Start: 2023-07-22 — End: 2023-10-25

## 2023-07-22 MED ORDER — APIXABAN 5 MG PO TABS: 5.0000 mg | ORAL_TABLET | Freq: Two times a day (BID) | ORAL | Status: DC

## 2023-07-22 NOTE — Progress Notes (Signed)
 Patient discharging to [facility].  Awaiting PTAR for transport.  Report called to [facility] LPN at [phone] ext [phone].  Updated patient and daughter/ Megan Baldwin at bedside about transport to [facility] and discharging via PTAR later this evening.

## 2023-07-22 NOTE — Progress Notes (Signed)
 PROGRESS NOTE    Megan Baldwin  YNW:[id number] DOB: April 03, 1933 DOA: 07/14/2023 PCP: Megan Coombe, MD  Subjective: Pt seen and examined. She is eating breakfast. Stable. No issues overnight. Still awaiting PASSAR number.   Hospital Course: HPI: Megan Baldwin is a 89 y.o. female with medical history significant for anxiety, COPD, mild cognitive impairment, vitamin B12 deficiency, osteoarthritis, HLD, HTN, DDD, and osteoporosis who presented to EMS for evaluation of altered mental status. Patient altered so unable to give accurate history. Per daughter, patient's friend came by her house and noticed that patient was on the ground. Patient has history of recurrent falls and has fallen about 3 times in the past 2 months (daughter thinks this is likely due to patient not using her walker appropriately). Patient has been complaining of some left leg pain and over the last year, has had a decline in her cognition. During a recent MMSE by home health RN, patient was unable to recall the 3 items but does not have a formal diagnosis for dementia. At bedside, patient able to tell me her name, DOB and that she is currently in hospital but disoriented to time, and situation and thinks she is in the ER.  She denies any fevers, chills, abdominal pain, cough, SOB, nausea or vomiting.    Significant Events: Admitted 07/14/2023 for acute metabolic encephalopathy   Admission Labs: UA Moderate LE, WBC 21-50 Urine culture negative Na 134, K 4.0, CO2 of 21, BUN 21, Scr 1.01, glu 83 WBC 5.4, HgB 9.4, plt 69 TSH 0.9 NH3 < 13  Admission Imaging Studies: CXR Interval small amount of patchy density at the right lateral lung base, which could represent atelectasis or pneumonia. 2. Possible small right pleural effusion. 3. Stable mild cardiomegaly. CT head No CT evidence for acute intracranial abnormality. 2. Atrophy and advanced chronic small vessel ischemic changes of the white matter. Small chronic appearing  cortical/subcortical infarct at the left frontal vertex. Pelvic XR Advanced osteoarthritis in the hips bilaterally. No acute bony abnormality  Significant Labs:   Significant Imaging Studies: 07-16-2023 CTPA Positive for bilateral pulmonary PE with multifocal left lower lobe pulmonary infarcts. Trace left pleural effusion. RV/LV Ratio = 1.0 suggesting intermediate risk PE. Please refer to the "Code PE Focused" order set in EPIC.2.  Aortic Atherosclerosis (ICD10-I70.0).  Cholelithiasis.  Antibiotic Therapy: Anti-infectives (From admission, onward)    Start     Dose/Rate Route Frequency Ordered Stop   07/18/23 1300  azithromycin  (ZITHROMAX ) 500 mg in sodium chloride  0.9 % 250 mL IVPB        500 mg 250 mL/hr over 60 Minutes Intravenous Daily 07/18/23 1148     07/17/23 2000  cefTRIAXone  (ROCEPHIN ) 1 g in sodium chloride  0.9 % 100 mL IVPB        1 g 200 mL/hr over 30 Minutes Intravenous Every 24 hours 07/17/23 1802     07/15/23 1800  cefTRIAXone  (ROCEPHIN ) 1 g in sodium chloride  0.9 % 100 mL IVPB  Status:  Discontinued        1 g 200 mL/hr over 30 Minutes Intravenous Every 24 hours 07/14/23 2029 07/16/23 1252   07/14/23 1815  cefTRIAXone  (ROCEPHIN ) 1 g in sodium chloride  0.9 % 100 mL IVPB        1 g 200 mL/hr over 30 Minutes Intravenous  Once 07/14/23 1805 07/14/23 2059       Procedures:   Consultants: Cardiology Palliative care    Assessment and Plan: * Acute metabolic encephalopathy-resolved as of 07/20/2023 Prior  to 07-20-2023 - Initially presumed in the setting of UTI with underlying mild cognitive impairment and memory loss - Seems like she is rather having acceleration of the decline of her baseline cognition.  Believe she is at least mild if not moderate dementia. -Aricept   - maximize B12 stores in light of notable B12 deficiency-- repleted - Recommend outpatient neurology follow-up   07-20-2023 resolved. Pt is awake and alert now. Going to SNF.  Acute pulmonary  embolism without acute cor pulmonale (HCC) Prior to 07-20-2023 - No chest pain, no respiratory/cardiopulmonary symptoms. - Eliquis  today. - Bilateral Dopplers revealed DVTs bilaterally today.  Daughter not surprised by this as she has had lower extremity swelling for a couple of weeks prior to admission.  Also had episode of chest pain about a week prior to admission according to family with some shortness of breath.  Resolved, so they didn't bring her to the ER. - Echo repeated this admit, no evidence of heart strain, EF good.    07-20-2023 stable on Eliquis . On RA.  07-21-2023 stable. On Eliquis . Will need 6 months of systemic anticoagulation treatment.  07-22-2023 stable on Eliquis . Will need 6 months of treatment with Eliquis .  Lower leg DVT (deep venous thromboembolism), acute, bilateral (HCC) Prior to 07-20-2023. Bilateral LE U/S showed DVT in both legs  07-20-2023 continue with Eliquis .  07-21-2023 stable. Will need 6 months of systemic anticoagulation treatment.  07-22-2023 stable   Normocytic anemia Prior to 07-20-2023 Hgb stabilized at 9-10 -repleted B12   07-20-2023 stable. HgB 8.7. no iron deficiency  Iron/TIBC/Ferritin/ %Sat    Component Value Date/Time   IRON 36 07/15/2023 0353   TIBC 269 07/15/2023 0353   FERRITIN 166 07/15/2023 0353   IRONPCTSAT 13 07/15/2023 0353   07-21-2023 stable  Bilateral hip joint arthritis Prior to 07-20-2023 - Per daughter, patient has history of falls and has fallen about 3 times in the past 2 months - X-ray of the hips shows advanced osteoarthritis bilaterally but no acute bony abnormality - as noted above, it's unclear how long she was down at home with most recent fall - Pain control with scheduled Tylenol  and Voltaren  gel - PT/OT eval and treat - Fall precautions  07-20-2023 stable. On tylenol  prn. Awaiting SNF placement.  07-22-2023 stable  07-21-2023 stable  COPD (chronic obstructive pulmonary disease) (HCC) 07-20-2023  stable on prn duonebs.  07-21-2023 stable  07-22-2023 stable   Essential hypertension 07-20-2023 stable on norvasc .  07-21-2023 stable  07-22-2023 stable.  Elevated troponin-resolved as of 07/20/2023 07-20-2023 due to acute PE. Resolved.  Acute cystitis without hematuria-resolved as of 07/20/2023 Prior to 07-20-2023 - Presented with altered mental status and concern for infection on U/A.  Since admit, though, she's been afebrile, with no leukocytosis - culture is no growth but she does have fever so will treat with rocephin  x 5 days  07-20-2023 resolved.  DVT prophylaxis: Place TED hose Start: 07/14/23 2037 Place and maintain sequential compression device Start: 07/14/23 1911 apixaban  (ELIQUIS ) tablet 10 mg  apixaban  (ELIQUIS ) tablet 5 mg     Code Status: Full Code Family Communication: no family at bedside Disposition Plan: SNF Reason for continuing need for hospitalization: awaiting PASSAR number. Medically stable for DC.  Objective: Vitals:   07/21/23 0531 07/21/23 1254 07/21/23 1951 07/22/23 0507  BP: 139/79 124/73 (!) 122/56 (!) 151/70  Pulse: 81 79 77 90  Resp: 18 18 19 16   Temp: 98 F (36.7 C) 98.5 F (36.9 C) 98.5 F (36.9 C) 98.2 F (  36.8 C)  TempSrc: Oral Oral Oral   SpO2: 100% 100% 99% 99%  Weight:      Height:        Intake/Output Summary (Last 24 hours) at 07/22/2023 1027 Last data filed at 07/21/2023 2305 Gross per 24 hour  Intake 120 ml  Output --  Net 120 ml   Filed Weights   07/14/23 1614  Weight: 68 kg    Examination:  Physical Exam Vitals and nursing note reviewed.  Constitutional:      Comments: Awake and pleasant  HENT:     Head: Normocephalic and atraumatic.  Cardiovascular:     Rate and Rhythm: Normal rate and regular rhythm.  Pulmonary:     Effort: Pulmonary effort is normal. No respiratory distress.     Breath sounds: Normal breath sounds. No wheezing.  Abdominal:     General: Abdomen is flat. Bowel sounds are normal.      Palpations: Abdomen is soft.  Skin:    General: Skin is warm and dry.     Capillary Refill: Capillary refill takes less than 2 seconds.  Neurological:     General: No focal deficit present.     Data Reviewed: I have personally reviewed following labs and imaging studies  CBC: Recent Labs  Lab 07/17/23 0705 07/18/23 0350 07/19/23 0410 07/20/23 0421 07/21/23 0412  WBC 8.7 12.3* 10.0 7.8 4.7  HGB 9.7* 9.0* 9.0* 8.7* 8.5*  HCT 31.7* 28.0* 28.8* 27.6* 27.9*  MCV 96.9 94.0 95.4 96.5 96.9  PLT 106* 113* 134* 159 179   Basic Metabolic Panel: Recent Labs  Lab 07/16/23 0107 07/16/23 1409 07/17/23 0705 07/18/23 0350 07/20/23 0421  NA 132* 135 135 132* 134*  K 3.7 4.2 3.9 3.7 3.5  CL 102 107 106 103 107  CO2 20* 18* 22 19* 20*  GLUCOSE 107* 120* 99 95 85  BUN 17 16 15 12 17   CREATININE 0.93 0.95 0.86 0.84 0.90  CALCIUM  8.8* 8.6* 8.5* 8.2* 7.9*   GFR: Estimated Creatinine Clearance: 40 mL/min (by C-G formula based on SCr of 0.9 mg/dL). Coagulation Profile: Recent Labs  Lab 07/16/23 0609  INR 1.4*   BNP (last 3 results) Recent Labs    07/16/23 0107  BNP 139.4*    Recent Results (from the past 240 hours)  Urine Culture     Status: None   Collection Time: 07/14/23  4:42 PM   Specimen: Urine, Random  Result Value Ref Range Status   Specimen Description   Final    URINE, RANDOM Performed at [facility], [address], [place], [place] [address]    Special Requests   Final    NONE Reflexed from [id number] Performed at [facility], [address] [place], [place] [address]    Culture   Final    NO GROWTH Performed at [facility], [address] [place], [place] [address]    Report Status 07/15/2023 FINAL  Final  Culture, blood (Routine X 2) w Reflex to ID Panel     Status: None   Collection Time: 07/17/23  6:44 PM   Specimen: BLOOD  Result Value Ref Range Status   Specimen Description   Final    BLOOD BLOOD LEFT  HAND Performed at [facility], [address], [place], [place] [address]    Special Requests   Final    BOTTLES DRAWN AEROBIC AND ANAEROBIC Blood Culture results may not be optimal due to an inadequate  volume of blood received in culture bottles Performed at [facility], [address], [place], [place] [address]    Culture   Final    NO GROWTH 5 DAYS Performed at [facility], [address] [place], [place] [address]    Report Status 07/22/2023 FINAL  Final  Culture, blood (Routine X 2) w Reflex to ID Panel     Status: None   Collection Time: 07/17/23  6:44 PM   Specimen: BLOOD  Result Value Ref Range Status   Specimen Description   Final    BLOOD BLOOD LEFT HAND Performed at [facility], [address] [place], [place] [address]    Special Requests   Final    Blood Culture results may not be optimal due to an inadequate volume of blood received in culture bottles BOTTLES DRAWN AEROBIC AND ANAEROBIC Performed at [facility], [address] [place], [place] [address]    Culture   Final    NO GROWTH 5 DAYS Performed at [facility], [address], [place], [place] [address]    Report Status 07/22/2023 FINAL  Final   Scheduled Meds:  acetaminophen   1,000 mg Oral TID   amLODipine   5 mg Oral Daily   apixaban   10 mg Oral BID   Followed by   Cecily Cohen ON 07/24/2023] apixaban   5 mg Oral BID   diclofenac  Sodium  2 g Topical QID   donepezil   5 mg Oral QHS   feeding supplement  237 mL Oral BID BM   rosuvastatin   10 mg Oral Daily   Continuous Infusions:   LOS: 8 days   Time spent: 40 minutes  Unk Garb, DO  [facility]  07/22/2023, 10:27 AM

## 2023-07-22 NOTE — Discharge Summary (Signed)
 [facility] Physician Discharge Summary   Patient name: Megan Baldwin  Admit date:     07/14/2023  Discharge date: 07/22/2023  Attending Physician: Vita Grip [[id number]]  Discharge Physician: Unk Garb   PCP: Roslyn Coombe, MD  Admitted From: Home  Disposition:  [name]  Recommendations for Outpatient Follow-up:  Follow up with PCP in 1-2 weeks  Home Health:No Equipment/Devices: None    Discharge Condition:Stable CODE STATUS:FULL Diet recommendation: Heart Healthy Fluid Restriction: None  Hospital Summary: HPI: Megan Baldwin is a 89 y.o. female with medical history significant for anxiety, COPD, mild cognitive impairment, vitamin B12 deficiency, osteoarthritis, HLD, HTN, DDD, and osteoporosis who presented to EMS for evaluation of altered mental status. Patient altered so unable to give accurate history. Per daughter, patient's friend came by her house and noticed that patient was on the ground. Patient has history of recurrent falls and has fallen about 3 times in the past 2 months (daughter thinks this is likely due to patient not using her walker appropriately). Patient has been complaining of some left leg pain and over the last year, has had a decline in her cognition. During a recent MMSE by home health RN, patient was unable to recall the 3 items but does not have a formal diagnosis for dementia. At bedside, patient able to tell me her name, DOB and that she is currently in hospital but disoriented to time, and situation and thinks she is in the ER.  She denies any fevers, chills, abdominal pain, cough, SOB, nausea or vomiting.    Significant Events: Admitted 07/14/2023 for acute metabolic encephalopathy   Admission Labs: UA Moderate LE, WBC 21-50 Urine culture negative Na 134, K 4.0, CO2 of 21, BUN 21, Scr 1.01, glu 83 WBC 5.4, HgB 9.4, plt 69 TSH 0.9 NH3 < 13  Admission Imaging Studies: CXR Interval small amount of patchy density at the  right lateral lung base, which could represent atelectasis or pneumonia. 2. Possible small right pleural effusion. 3. Stable mild cardiomegaly. CT head No CT evidence for acute intracranial abnormality. 2. Atrophy and advanced chronic small vessel ischemic changes of the white matter. Small chronic appearing cortical/subcortical infarct at the left frontal vertex. Pelvic XR Advanced osteoarthritis in the hips bilaterally. No acute bony abnormality  Significant Labs:   Significant Imaging Studies: 07-16-2023 CTPA Positive for bilateral pulmonary PE with multifocal left lower lobe pulmonary infarcts. Trace left pleural effusion. RV/LV Ratio = 1.0 suggesting intermediate risk PE. Please refer to the "Code PE Focused" order set in EPIC.2.  Aortic Atherosclerosis (ICD10-I70.0).  Cholelithiasis.  Antibiotic Therapy: Anti-infectives (From admission, onward)    Start     Dose/Rate Route Frequency Ordered Stop   07/18/23 1300  azithromycin  (ZITHROMAX ) 500 mg in sodium chloride  0.9 % 250 mL IVPB        500 mg 250 mL/hr over 60 Minutes Intravenous Daily 07/18/23 1148     07/17/23 2000  cefTRIAXone  (ROCEPHIN ) 1 g in sodium chloride  0.9 % 100 mL IVPB        1 g 200 mL/hr over 30 Minutes Intravenous Every 24 hours 07/17/23 1802     07/15/23 1800  cefTRIAXone  (ROCEPHIN ) 1 g in sodium chloride  0.9 % 100 mL IVPB  Status:  Discontinued        1 g 200 mL/hr over 30 Minutes Intravenous Every 24 hours 07/14/23 2029 07/16/23 1252   07/14/23 1815  cefTRIAXone  (ROCEPHIN ) 1 g in sodium chloride  0.9 % 100 mL IVPB  1 g 200 mL/hr over 30 Minutes Intravenous  Once 07/14/23 1805 07/14/23 2059       Procedures:   Consultants: Cardiology Palliative care   Hospital Course by Problem: * Acute metabolic encephalopathy-resolved as of 07/20/2023 Prior to 07-20-2023 - Initially presumed in the setting of UTI with underlying mild cognitive impairment and memory loss - Seems like she is rather having  acceleration of the decline of her baseline cognition.  Believe she is at least mild if not moderate dementia. -Aricept   - maximize B12 stores in light of notable B12 deficiency-- repleted - Recommend outpatient neurology follow-up   07-20-2023 resolved. Pt is awake and alert now. Going to SNF.  Acute pulmonary embolism without acute cor pulmonale (HCC) Prior to 07-20-2023 - No chest pain, no respiratory/cardiopulmonary symptoms. - Eliquis  today. - Bilateral Dopplers revealed DVTs bilaterally today.  Daughter not surprised by this as she has had lower extremity swelling for a couple of weeks prior to admission.  Also had episode of chest pain about a week prior to admission according to family with some shortness of breath.  Resolved, so they didn't bring her to the ER. - Echo repeated this admit, no evidence of heart strain, EF good.    07-20-2023 stable on Eliquis . On RA.  07-21-2023 stable. On Eliquis . Will need 6 months of systemic anticoagulation treatment.  07-22-2023 stable on Eliquis . Will need 6 months of treatment with Eliquis .  Lower leg DVT (deep venous thromboembolism), acute, bilateral (HCC) Prior to 07-20-2023. Bilateral LE U/S showed DVT in both legs  07-20-2023 continue with Eliquis .  07-21-2023 stable. Will need 6 months of systemic anticoagulation treatment.  07-22-2023 stable   Normocytic anemia Prior to 07-20-2023 Hgb stabilized at 9-10 -repleted B12   07-20-2023 stable. HgB 8.7. no iron deficiency  Iron/TIBC/Ferritin/ %Sat    Component Value Date/Time   IRON 36 07/15/2023 0353   TIBC 269 07/15/2023 0353   FERRITIN 166 07/15/2023 0353   IRONPCTSAT 13 07/15/2023 0353   07-21-2023 stable  Bilateral hip joint arthritis Prior to 07-20-2023 - Per daughter, patient has history of falls and has fallen about 3 times in the past 2 months - X-ray of the hips shows advanced osteoarthritis bilaterally but no acute bony abnormality - as noted above, it's unclear  how long she was down at home with most recent fall - Pain control with scheduled Tylenol  and Voltaren  gel - PT/OT eval and treat - Fall precautions  07-20-2023 stable. On tylenol  prn. Awaiting SNF placement.  07-22-2023 stable  07-21-2023 stable  COPD (chronic obstructive pulmonary disease) (HCC) 07-20-2023 stable on prn duonebs.  07-21-2023 stable  07-22-2023 stable   Essential hypertension 07-20-2023 stable on norvasc .  07-21-2023 stable  07-22-2023 stable.  Elevated troponin-resolved as of 07/20/2023 07-20-2023 due to acute PE. Resolved.  Acute cystitis without hematuria-resolved as of 07/20/2023 Prior to 07-20-2023 - Presented with altered mental status and concern for infection on U/A.  Since admit, though, she's been afebrile, with no leukocytosis - culture is no growth but she does have fever so will treat with rocephin  x 5 days  07-20-2023 resolved.    Discharge Diagnoses:  Active Problems:   Acute pulmonary embolism without acute cor pulmonale (HCC)   Essential hypertension   COPD (chronic obstructive pulmonary disease) (HCC)   Bilateral hip joint arthritis   Normocytic anemia   Palliative care by specialist   Lower leg DVT (deep venous thromboembolism), acute, bilateral [facility])   Discharge Instructions  Discharge Instructions  Call MD for:  difficulty breathing, headache or visual disturbances   Complete by: As directed    Call MD for:  extreme fatigue   Complete by: As directed    Call MD for:  hives   Complete by: As directed    Call MD for:  persistant dizziness or light-headedness   Complete by: As directed    Call MD for:  persistant nausea and vomiting   Complete by: As directed    Call MD for:  redness, tenderness, or signs of infection (pain, swelling, redness, odor or green/yellow discharge around incision site)   Complete by: As directed    Call MD for:  severe uncontrolled pain   Complete by: As directed    Call MD for:  temperature  >100.4   Complete by: As directed    Diet - low sodium heart healthy   Complete by: As directed    Discharge instructions   Complete by: As directed    1. Follow up with your primary care provider in 1-2 weeks following discharge from hospital.   Increase activity slowly   Complete by: As directed       Allergies as of 07/22/2023   No Known Allergies      Medication List     STOP taking these medications    diazepam  5 MG tablet Commonly known as: VALIUM    labetalol  200 MG tablet Commonly known as: NORMODYNE    spironolactone  50 MG tablet Commonly known as: ALDACTONE        TAKE these medications    acetaminophen  500 MG tablet Commonly known as: TYLENOL  Take 2 tablets (1,000 mg total) by mouth 3 (three) times daily.   amLODipine  5 MG tablet Commonly known as: NORVASC  Take 1 tablet (5 mg total) by mouth daily.   apixaban  5 MG Tabs tablet Commonly known as: ELIQUIS  Take 1 tablet (5 mg total) by mouth 2 (two) times daily.   cyanocobalamin  1000 MCG tablet Commonly known as: VITAMIN B12 Take 1 tablet (1,000 mcg total) by mouth daily.   diclofenac  Sodium 1 % Gel Commonly known as: VOLTAREN  Apply 2 g topically 4 (four) times daily.   donepezil  5 MG tablet Commonly known as: ARICEPT  Take 1 tablet (5 mg total) by mouth at bedtime.   ipratropium-albuterol  0.5-2.5 (3) MG/3ML Soln Commonly known as: DUONEB Take 3 mLs by nebulization every 6 (six) hours as needed.   rosuvastatin  10 MG tablet Commonly known as: CRESTOR  Take 1 tablet (10 mg total) by mouth daily.   senna-docusate 8.6-50 MG tablet Commonly known as: Senokot-S Take 1 tablet by mouth at bedtime as needed for mild constipation.   traMADol  50 MG tablet Commonly known as: ULTRAM  Take 1 tablet (50 mg total) by mouth every 6 (six) hours as needed (for pain).        Contact information for after-discharge care     Destination     [facility] Preferred SNF .    Service: Skilled Nursing Contact information: [address] [place]  [phone]                    No Known Allergies  Discharge Exam: Vitals:   07/22/23 0507 07/22/23 1044  BP: (!) 151/70 134/67  Pulse: 90 74  Resp: 16 17  Temp: 98.2 F (36.8 C)   SpO2: 99% 100%    Physical Exam Vitals and nursing note reviewed.  Constitutional:      Comments: Awake and pleasant  HENT:  Head: Normocephalic and atraumatic.  Cardiovascular:     Rate and Rhythm: Normal rate and regular rhythm.  Pulmonary:     Effort: Pulmonary effort is normal. No respiratory distress.     Breath sounds: Normal breath sounds. No wheezing.  Abdominal:     General: Abdomen is flat. Bowel sounds are normal.     Palpations: Abdomen is soft.  Skin:    General: Skin is warm and dry.     Capillary Refill: Capillary refill takes less than 2 seconds.  Neurological:     General: No focal deficit present.     The results of significant diagnostics from this hospitalization (including imaging, microbiology, ancillary and laboratory) are listed below for reference.    Microbiology: Recent Results (from the past 240 hours)  Urine Culture     Status: None   Collection Time: 07/14/23  4:42 PM   Specimen: Urine, Random  Result Value Ref Range Status   Specimen Description   Final    URINE, RANDOM Performed at [facility], [address], [place], [place] [address]    Special Requests   Final    NONE Reflexed from [phone] Performed at [facility], [address], [place], [place] [address]    Culture   Final    NO GROWTH Performed at [facility], [address], [place], [place] [address]    Report Status 07/15/2023 FINAL  Final  Culture, blood (Routine X 2) w Reflex to ID Panel     Status: None   Collection Time: 07/17/23  6:44 PM   Specimen: BLOOD  Result Value Ref Range Status   Specimen Description   Final     BLOOD BLOOD LEFT HAND Performed at [facility], [address] [place], [place] [address]    Special Requests   Final    BOTTLES DRAWN AEROBIC AND ANAEROBIC Blood Culture results may not be optimal due to an inadequate volume of blood received in culture bottles Performed at [facility], [address], [place], [place] [address]    Culture   Final    NO GROWTH 5 DAYS Performed at [facility], [address], [place], [place] [address]    Report Status 07/22/2023 FINAL  Final  Culture, blood (Routine X 2) w Reflex to ID Panel     Status: None   Collection Time: 07/17/23  6:44 PM   Specimen: BLOOD  Result Value Ref Range Status   Specimen Description   Final    BLOOD BLOOD LEFT HAND Performed at [facility], [address] [place], [place] [address]    Special Requests   Final    Blood Culture results may not be optimal due to an inadequate volume of blood received in culture bottles BOTTLES DRAWN AEROBIC AND ANAEROBIC Performed at [facility], [address], [place], [place] [address]    Culture   Final    NO GROWTH 5 DAYS Performed at [facility], [address], [place], [place] [address]    Report Status 07/22/2023 FINAL  Final     Labs: BNP (last 3 results) Recent Labs    07/16/23 0107  BNP 139.4*   Basic Metabolic Panel: Recent Labs  Lab 07/16/23 0107 07/16/23 1409 07/17/23 0705 07/18/23 0350 07/20/23 0421  NA 132* 135 135 132* 134*  K 3.7 4.2 3.9 3.7 3.5  CL 102 107 106 103 107  CO2 20* 18* 22 19* 20*  GLUCOSE 107* 120* 99 95 85  BUN 17 16 15 12 17   CREATININE 0.93 0.95 0.86 0.84 0.90  CALCIUM  8.8* 8.6* 8.5* 8.2* 7.9*   CBC: Recent Labs  Lab 07/17/23 0705 07/18/23 0350 07/19/23 0410 07/20/23 0421 07/21/23 0412  WBC 8.7 12.3* 10.0 7.8 4.7  HGB 9.7* 9.0* 9.0* 8.7* 8.5*  HCT 31.7* 28.0* 28.8* 27.6* 27.9*  MCV 96.9 94.0 95.4 96.5 96.9  PLT 106* 113*  134* 159 179   BNP: Recent Labs  Lab 07/16/23 0107  BNP 139.4*   Urinalysis    Component Value Date/Time   COLORURINE STRAW (A) 07/17/2023 2200   APPEARANCEUR CLEAR 07/17/2023 2200   LABSPEC 1.005 07/17/2023 2200   PHURINE 7.0 07/17/2023 2200   GLUCOSEU NEGATIVE 07/17/2023 2200   GLUCOSEU NEGATIVE 05/13/2023 1439   HGBUR NEGATIVE 07/17/2023 2200   BILIRUBINUR NEGATIVE 07/17/2023 2200   KETONESUR NEGATIVE 07/17/2023 2200   PROTEINUR NEGATIVE 07/17/2023 2200   UROBILINOGEN 1.0 05/13/2023 1439   NITRITE NEGATIVE 07/17/2023 2200   LEUKOCYTESUR NEGATIVE 07/17/2023 2200   Sepsis Labs Recent Labs  Lab 07/18/23 0350 07/19/23 0410 07/20/23 0421 07/21/23 0412  WBC 12.3* 10.0 7.8 4.7    Procedures/Studies: VAS US  LOWER EXTREMITY VENOUS (DVT) Result Date: 07/18/2023  Lower Venous DVT Study Patient Name:  VALECIA BESKE  Date of Exam:   07/17/2023 Medical Rec #: [id number]        Accession #:    [id number] Date of Birth: [birth year]         Patient Gender: F Patient Age:   20 years Exam Location:  [facility] Procedure:      VAS US  LOWER EXTREMITY VENOUS (DVT) Referring Phys: ABIGAIL CHAVEZ --------------------------------------------------------------------------------  Indications: Elevated D-dimer (18.87), and pulmonary embolism.  Limitations: Poor ultrasound/tissue interface and patient movement (restlessness). Comparison Study: No previous exams Performing Technologist: Jody Hill RVT, RDMS  Examination Guidelines: A complete evaluation includes B-mode imaging, spectral Doppler, color Doppler, and power Doppler as needed of all accessible portions of each vessel. Bilateral testing is considered an integral part of a complete examination. Limited examinations for reoccurring indications may be performed as noted. The reflux portion of the exam is performed with the patient in reverse Trendelenburg.  +---------+---------------+---------+-----------+----------+--------------+ RIGHT     CompressibilityPhasicitySpontaneityPropertiesThrombus Aging +---------+---------------+---------+-----------+----------+--------------+ CFV      Full           Yes      Yes                                 +---------+---------------+---------+-----------+----------+--------------+ SFJ      Full                                                        +---------+---------------+---------+-----------+----------+--------------+ FV Prox  Full           Yes      Yes                                 +---------+---------------+---------+-----------+----------+--------------+ FV Mid   Full           Yes      Yes                                 +---------+---------------+---------+-----------+----------+--------------+  FV DistalNone           No       No                   Acute          +---------+---------------+---------+-----------+----------+--------------+ PFV      Full           Yes      Yes                                 +---------+---------------+---------+-----------+----------+--------------+ POP      None           No       No                   Acute          +---------+---------------+---------+-----------+----------+--------------+ PTV      None           No       No                   Acute          +---------+---------------+---------+-----------+----------+--------------+ PERO     None           No       No                   Acute          +---------+---------------+---------+-----------+----------+--------------+   +---------+---------------+---------+-----------+----------+-----------------+ LEFT     CompressibilityPhasicitySpontaneityPropertiesThrombus Aging    +---------+---------------+---------+-----------+----------+-----------------+ CFV      Full           Yes      Yes                                    +---------+---------------+---------+-----------+----------+-----------------+ SFJ      Partial                                       Age Indeterminate +---------+---------------+---------+-----------+----------+-----------------+ FV Prox  Partial        Yes      Yes                  Age Indeterminate +---------+---------------+---------+-----------+----------+-----------------+ FV Mid   Partial        Yes      Yes                  Age Indeterminate +---------+---------------+---------+-----------+----------+-----------------+ FV DistalFull           Yes      Yes                                    +---------+---------------+---------+-----------+----------+-----------------+ PFV      Partial        No       Yes                  Age Indeterminate +---------+---------------+---------+-----------+----------+-----------------+ POP      Full           Yes      Yes                                    +---------+---------------+---------+-----------+----------+-----------------+  PTV      Full                                                           +---------+---------------+---------+-----------+----------+-----------------+ PERO     Full                                                           +---------+---------------+---------+-----------+----------+-----------------+     Summary: BILATERAL: -No evidence of popliteal cyst, bilaterally. RIGHT: - Findings consistent with acute deep vein thrombosis involving the right femoral vein, right popliteal vein, right posterior tibial veins, and right peroneal veins.   LEFT: - Findings consistent with age indeterminate deep vein thrombosis involving the SF junction, left femoral vein, and left proximal profunda vein.   *See table(s) above for measurements and observations. Electronically signed by Runell Countryman on 07/18/2023 at 3:42:05 PM.    Final    ECHOCARDIOGRAM COMPLETE Result Date: 07/17/2023    ECHOCARDIOGRAM REPORT   Patient Name:   Lonzo Robinson Date of Exam: 07/17/2023 Medical Rec #:  [id number]       Height:       63.0 in Accession  #:    [id number]      Weight:       150.0 lb Date of Birth:  02/23/1934        BSA:          1.711 m Patient Age:    89 years        BP:           120/99 mmHg Patient Gender: F               HR:           85 bpm. Exam Location:  Inpatient Procedure: 2D Echo, 3D Echo, Color Doppler, Cardiac Doppler and Strain Analysis            (Both Spectral and Color Flow Doppler were utilized during            procedure). Indications:    Pulmonary Embolus  History:        Patient has prior history of Echocardiogram examinations, most                 recent 10/23/2010. Risk Factors:Hypertension and Dyslipidemia.                 Chronic Kidney Diseas.  Sonographer:    Travis Friedman RDCS Referring Phys: [id number] AUGUSTUS E MEALOR IMPRESSIONS  1. Left ventricular ejection fraction, by estimation, is 60 to 65%. Left ventricular ejection fraction by 3D volume is 61 %. The left ventricle has normal function. The left ventricle has no regional wall motion abnormalities. There is mild left ventricular hypertrophy of the basal-septal segment. Left ventricular diastolic parameters are consistent with Grade I diastolic dysfunction (impaired relaxation). The average left ventricular global longitudinal strain is -18.1 %. The global longitudinal strain is normal.  2. Right ventricular systolic function is normal. The right ventricular size is normal. There is normal pulmonary artery systolic pressure. The estimated right ventricular systolic pressure is 31.9 mmHg.  3. Left atrial size  was severely dilated.  4. The mitral valve is degenerative. No evidence of mitral valve regurgitation. Mild mitral stenosis. The mean mitral valve gradient is 4.0 mmHg. Severe mitral annular calcification.  5. The aortic valve is tricuspid. There is moderate calcification of the aortic valve. Aortic valve regurgitation is mild to moderate. No aortic stenosis is present. Aortic valve mean gradient measures 7.0 mmHg. Aortic valve Vmax measures 1.71 m/s.  6. The inferior  vena cava is normal in size with greater than 50% respiratory variability, suggesting right atrial pressure of 3 mmHg. FINDINGS  Left Ventricle: Left ventricular ejection fraction, by estimation, is 60 to 65%. Left ventricular ejection fraction by 3D volume is 61 %. The left ventricle has normal function. The left ventricle has no regional wall motion abnormalities. The average left ventricular global longitudinal strain is -18.1 %. Strain was performed and the global longitudinal strain is normal. The left ventricular internal cavity size was normal in size. There is mild left ventricular hypertrophy of the basal-septal segment. Left ventricular diastolic parameters are consistent with Grade I diastolic dysfunction (impaired relaxation). Right Ventricle: The right ventricular size is normal. No increase in right ventricular wall thickness. Right ventricular systolic function is normal. There is normal pulmonary artery systolic pressure. The tricuspid regurgitant velocity is 2.69 m/s, and  with an assumed right atrial pressure of 3 mmHg, the estimated right ventricular systolic pressure is 31.9 mmHg. Left Atrium: Left atrial size was severely dilated. Right Atrium: Right atrial size was normal in size. Pericardium: There is no evidence of pericardial effusion. Mitral Valve: The mitral valve is degenerative in appearance. There is moderate thickening of the anterior mitral valve leaflet(s). There is moderate calcification of the anterior mitral valve leaflet(s). Mildly decreased mobility of the mitral valve leaflets. Severe mitral annular calcification. No evidence of mitral valve regurgitation. Mild mitral valve stenosis. MV peak gradient, 9.7 mmHg. The mean mitral valve gradient is 4.0 mmHg. Tricuspid Valve: The tricuspid valve is normal in structure. Tricuspid valve regurgitation is mild . No evidence of tricuspid stenosis. Aortic Valve: The aortic valve is tricuspid. There is moderate calcification of the aortic  valve. Aortic valve regurgitation is mild to moderate. Aortic regurgitation PHT measures 372 msec. No aortic stenosis is present. Aortic valve mean gradient measures 7.0 mmHg. Aortic valve peak gradient measures 11.7 mmHg. Aortic valve area, by VTI measures 3.37 cm. Pulmonic Valve: The pulmonic valve was normal in structure. Pulmonic valve regurgitation is not visualized. No evidence of pulmonic stenosis. Aorta: The aortic root is normal in size and structure. Venous: The inferior vena cava is normal in size with greater than 50% respiratory variability, suggesting right atrial pressure of 3 mmHg. IAS/Shunts: No atrial level shunt detected by color flow Doppler. Additional Comments: 3D was performed not requiring image post processing on an independent workstation and was normal.  LEFT VENTRICLE PLAX 2D LVIDd:         3.60 cm         Diastology LVIDs:         2.00 cm         LV e' medial:    0.05 cm/s LV PW:         1.20 cm         LV E/e' medial:  24.1 LV IVS:        1.20 cm         LV e' lateral:   0.06 cm/s LVOT diam:     2.10 cm  LV E/e' lateral: 19.8 LV SV:         109 LV SV Index:   64              2D Longitudinal LVOT Area:     3.46 cm        Strain                                2D Strain GLS   -18.1 %                                Avg:                                 3D Volume EF                                LV 3D EF:    Left                                             ventricul                                             ar                                             ejection                                             fraction                                             by 3D                                             volume is                                             61 %.                                 3D Volume EF:                                3D EF:        61 %  LV EDV:       136 ml                                LV ESV:       54 ml                                 LV SV:        82 ml RIGHT VENTRICLE             IVC RV Basal diam:  3.70 cm     IVC diam: 1.20 cm RV S prime:     11.80 cm/s TAPSE (M-mode): 2.5 cm LEFT ATRIUM            Index        RIGHT ATRIUM           Index LA diam:      3.90 cm  2.28 cm/m   RA Area:     16.40 cm LA Vol (A2C): 36.7 ml  21.45 ml/m  RA Volume:   42.20 ml  24.66 ml/m LA Vol (A4C): 106.0 ml 61.95 ml/m  AORTIC VALVE AV Area (Vmax):    3.67 cm AV Area (Vmean):   3.41 cm AV Area (VTI):     3.37 cm AV Vmax:           171.00 cm/s AV Vmean:          122.000 cm/s AV VTI:            0.323 m AV Peak Grad:      11.7 mmHg AV Mean Grad:      7.0 mmHg LVOT Vmax:         181.00 cm/s LVOT Vmean:        120.000 cm/s LVOT VTI:          0.314 m LVOT/AV VTI ratio: 0.97 AI PHT:            372 msec  AORTA Ao Asc diam: 3.20 cm MITRAL VALVE                TRICUSPID VALVE MV Area (PHT): 3.34 cm     TR Peak grad:   28.9 mmHg MV Area VTI:   2.10 cm     TR Vmax:        269.00 cm/s MV Peak grad:  9.7 mmHg MV Mean grad:  4.0 mmHg     SHUNTS MV Vmax:       1.56 m/s     Systemic VTI:  0.31 m MV Vmean:      90.1 cm/s    Systemic Diam: 2.10 cm MV Decel Time: 227 msec MV E velocity: 1.10 cm/s MV A velocity: 167.00 cm/s MV E/A ratio:  0.01 Dorothye Gathers MD Electronically signed by Dorothye Gathers MD Signature Date/Time: 07/17/2023/12:08:33 PM    Final    CT Angio Chest Pulmonary Embolism (PE) W or WO Contrast Addendum Date: 07/16/2023 ADDENDUM REPORT: 07/16/2023 12:43 ADDENDUM: Study discussed by telephone with Dr. Earlean Glaze on 07/16/2023 at 1218 hours. Electronically Signed   By: Marlise Simpers M.D.   On: 07/16/2023 12:43   Result Date: 07/16/2023 CLINICAL DATA:  89 year old female with chest pain. EXAM: CT ANGIOGRAPHY CHEST WITH CONTRAST TECHNIQUE: Multidetector CT imaging of the chest was performed using the standard protocol during bolus administration of intravenous contrast.  Multiplanar CT image reconstructions and MIPs were obtained to evaluate the vascular  anatomy. RADIATION DOSE REDUCTION: This exam was performed according to the departmental dose-optimization program which includes automated exposure control, adjustment of the mA and/or kV according to patient size and/or use of iterative reconstruction technique. CONTRAST:  75mL OMNIPAQUE  IOHEXOL  350 MG/ML SOLN COMPARISON:  Portable chest 0424 hours today.  Chest CT 08/14/2008. FINDINGS: Cardiovascular: Good contrast bolus timing in the pulmonary arterial tree. Right main pulmonary artery thrombus, branching into the lobar arteries, although somewhat discontinuous and partially nonocclusive, flow around much of the clot. No saddle embolus. No left main or upper lobe pulmonary artery filling defect. Segmental left lower lobe PE on series 5, image 125, which appears occlusive and tracks into the anterior basal segment. Tortuous thoracic aorta with atherosclerosis. No pericardial effusion. Mild cardiomegaly. RV LV ratio is 1.0. Mediastinum/Nodes: Negative for mediastinal mass or lymphadenopathy. Lungs/Pleura: Wedge-shaped left lower lobe segmental opacity, lateral and posterior basal segments, suspicious for pulmonary infarction. Trace left pleural effusion. No right lung infarct or consolidation. Dependent right lung opacity. No significant right pleural effusion. Major airways are patent. Upper Abdomen: Extensive cholelithiasis. Negative other visible early contrast enhanced upper abdominal viscera. No upper abdominal free air or free fluid. Musculoskeletal: Osteopenia. Chronic T11 superior endplate compression or Schmorl's node is stable since 2010. No acute or suspicious osseous lesion. Review of the MIP images confirms the above findings. IMPRESSION: 1. Positive for bilateral pulmonary PE with multifocal left lower lobe pulmonary infarcts. Trace left pleural effusion. RV/LV Ratio = 1.0 suggesting intermediate risk PE. Please refer to the "Code PE Focused" order set in EPIC. 2.  Aortic Atherosclerosis  (ICD10-I70.0).  Cholelithiasis. Electronically Signed: By: Marlise Simpers M.D. On: 07/16/2023 12:12   DG Chest Port 1 View Result Date: 07/16/2023 CLINICAL DATA:  [id number].  Chest pains. EXAM: PORTABLE CHEST 1 VIEW COMPARISON:  Portable chest 07/14/2023. FINDINGS: 4:24 a.m. Stable mild cardiomegaly. Stable mediastinum with aortic atherosclerosis and uncoiling. Mild increased interstitial consolidation in the bases most likely due to edema as there is also mild central vascular prominence. There are small pleural effusions. No focal pneumonia is evident. No other changes in the overall aeration. Thoracic spondylosis and degenerative change. The patient is rotated to the left. IMPRESSION: 1. Mild increased interstitial consolidation in the bases most likely due to edema as there is also mild central vascular prominence. Small pleural effusions. 2. Stable mild cardiomegaly. 3. Aortic atherosclerosis. Electronically Signed   By: Denman Fischer M.D.   On: 07/16/2023 05:20   DG Hip Unilat W or Wo Pelvis 2-3 Views Right Result Date: 07/14/2023 CLINICAL DATA:  Pain EXAM: DG HIP (WITH OR WITHOUT PELVIS) 2-3V RIGHT COMPARISON:  None Available. FINDINGS: Advanced degenerative changes in the hips bilaterally with joint space loss and spurring, left slightly greater than right. SI joints symmetric. No acute bony abnormality. Specifically, no fracture, subluxation, or dislocation. IMPRESSION: Advanced osteoarthritis in the hips bilaterally. No acute bony abnormality. Electronically Signed   By: Janeece Mechanic M.D.   On: 07/14/2023 18:49   CT Head Wo Contrast Result Date: 07/14/2023 CLINICAL DATA:  Mental status change EXAM: CT HEAD WITHOUT CONTRAST TECHNIQUE: Contiguous axial images were obtained from the base of the skull through the vertex without intravenous contrast. RADIATION DOSE REDUCTION: This exam was performed according to the departmental dose-optimization program which includes automated exposure control, adjustment of  the mA and/or kV according to patient size and/or use of iterative reconstruction technique. COMPARISON:  None Available.  FINDINGS: Brain: No acute territorial infarction, hemorrhage or intracranial mass. Small chronic appearing cortical/subcortical infarct at the left frontal vertex. Advanced white matter hypodensity. Moderate atrophy. Prominent ventricles felt secondary to atrophy. Possible small chronic infarcts in the left lateral cerebellum versus prominent atrophy and widened folia. Vascular: No hyperdense vessels.  Carotid vascular calcification Skull: Normal. Negative for fracture or focal lesion. Sinuses/Orbits: Retention cyst in the sphenoid sinuses Other: None IMPRESSION: 1. No CT evidence for acute intracranial abnormality. 2. Atrophy and advanced chronic small vessel ischemic changes of the white matter. Small chronic appearing cortical/subcortical infarct at the left frontal vertex. Electronically Signed   By: Esmeralda Hedge M.D.   On: 07/14/2023 18:01   DG Chest Portable 1 View Result Date: 07/14/2023 CLINICAL DATA:  Chest pain.  Altered mental status. EXAM: PORTABLE CHEST 1 VIEW COMPARISON:  04/27/2022 FINDINGS: Stable mildly enlarged cardiac silhouette. Tortuous and partially calcified thoracic aorta. Interval small amount of patchy density at the right lateral lung base with a possible small right pleural effusion. The remainder of the lungs are clear with normal vascularity. Diffuse osteopenia. IMPRESSION: 1. Interval small amount of patchy density at the right lateral lung base, which could represent atelectasis or pneumonia. 2. Possible small right pleural effusion. 3. Stable mild cardiomegaly. Electronically Signed   By: Catherin Closs M.D.   On: 07/14/2023 17:41    Time coordinating discharge: 55 mins  SIGNED:  Unk Garb, DO [facility] 07/22/23, 11:44 AM

## 2023-07-22 NOTE — Plan of Care (Signed)
  Problem: Education: Goal: Knowledge of General Education information will improve Description: Including pain rating scale, medication(s)/side effects and non-pharmacologic comfort measures Outcome: Adequate for Discharge   Problem: Health Behavior/Discharge Planning: Goal: Ability to manage health-related needs will improve Outcome: Adequate for Discharge   Problem: Clinical Measurements: Goal: Ability to maintain clinical measurements within normal limits will improve Outcome: Adequate for Discharge Goal: Will remain free from infection Outcome: Adequate for Discharge Goal: Diagnostic test results will improve Outcome: Adequate for Discharge Goal: Respiratory complications will improve Outcome: Adequate for Discharge Goal: Cardiovascular complication will be avoided Outcome: Adequate for Discharge   Problem: Activity: Goal: Risk for activity intolerance will decrease Outcome: Adequate for Discharge   Problem: Nutrition: Goal: Adequate nutrition will be maintained Outcome: Adequate for Discharge   Problem: Coping: Goal: Level of anxiety will decrease Outcome: Adequate for Discharge   Problem: Elimination: Goal: Will not experience complications related to bowel motility Outcome: Adequate for Discharge Goal: Will not experience complications related to urinary retention Outcome: Adequate for Discharge   Problem: Pain Managment: Goal: General experience of comfort will improve and/or be controlled Outcome: Adequate for Discharge   Problem: Safety: Goal: Ability to remain free from injury will improve Outcome: Adequate for Discharge   Problem: Skin Integrity: Goal: Risk for impaired skin integrity will decrease Outcome: Adequate for Discharge   Problem: Acute Rehab PT Goals(only PT should resolve) Goal: Pt Will Go Supine/Side To Sit Outcome: Adequate for Discharge Goal: Patient Will Transfer Sit To/From Stand Outcome: Adequate for Discharge Goal: Pt Will  Transfer Bed To Chair/Chair To Bed Outcome: Adequate for Discharge Goal: Pt Will Ambulate Outcome: Adequate for Discharge Goal: Pt/caregiver will Perform Home Exercise Program Outcome: Adequate for Discharge   Problem: Acute Rehab OT Goals (only OT should resolve) Goal: Pt. Will Perform Grooming Outcome: Adequate for Discharge Goal: Pt. Will Perform Lower Body Dressing Outcome: Adequate for Discharge Goal: Pt. Will Transfer To Toilet Outcome: Adequate for Discharge Goal: Pt. Will Perform Toileting-Clothing Manipulation Outcome: Adequate for Discharge   Problem: Education: Goal: Ability to identify signs and symptoms of gastrointestinal bleeding will improve Outcome: Adequate for Discharge   Problem: Bowel/Gastric: Goal: Will show no signs and symptoms of gastrointestinal bleeding Outcome: Adequate for Discharge   Problem: Fluid Volume: Goal: Will show no signs and symptoms of excessive bleeding Outcome: Adequate for Discharge   Problem: Clinical Measurements: Goal: Complications related to the disease process, condition or treatment will be avoided or minimized Outcome: Adequate for Discharge

## 2023-07-22 NOTE — TOC Transition Note (Signed)
 Transition of Care [facility]) - Discharge Note   Patient Details  Name: Megan Baldwin MRN: [id number] Date of Birth: 08/11/1933  [facility]) CM/SW Contact:  Gertha Ku, LCSW Phone Number: 07/22/2023, 12:02 PM   Clinical Narrative:     Pt's PASRR was assigned [id number] H.  Pt to d/c to [facility] for SNF placement. Pt will be going to room 3209 , RN to call report to [phone]. CSW attempted to call pt's daughter , no answer left VM informing her about transfer. PTAR called. No further TOC needs, TOC sign off.  Final next level of care: Skilled Nursing Facility Barriers to Discharge: Barriers Resolved   Patient Goals and CMS Choice Patient states their goals for this hospitalization and ongoing recovery are:: SNF to get stonger          Discharge Placement              Patient [facility] bed at: [facility] Patient to be transferred to facility by: [facility] Name of family member notified: Vianey, Caniglia (Daughter)  [phone] (Mobile Patient and family notified of of transfer: 07/22/23  Discharge Plan and Services Additional resources added to the After Visit Summary for                                       Social Drivers of Health (SDOH) Interventions SDOH Screenings   Food Insecurity: Patient Unable To Answer (07/14/2023)  Housing: Unknown (07/08/2023)  Transportation Needs: No Transportation Needs (07/08/2023)  Utilities: Not At Risk (07/08/2023)  Depression (PHQ2-9): Low Risk  (07/08/2023)  Tobacco Use: Medium Risk (07/14/2023)     Readmission Risk Interventions     No data to display

## 2023-07-22 NOTE — TOC Progression Note (Signed)
 Transition of Care [facility]) - Progression Note    Patient Details  Name: Megan Baldwin MRN: [id number] Date of Birth: Aug 03, 1933  [facility]) CM/SW Contact  Gertha Ku, LCSW Phone Number: 07/22/2023, 9:24 AM  Clinical Narrative:     Pt's insurance [employer] was approved for [employer]. Pt's PASRR is pending. TOC to follow.  Expected Discharge Plan: Skilled Nursing Facility Barriers to Discharge: Awaiting State Approval (PASRR)  Expected Discharge Plan and Services                                               Social Determinants of Health (SDOH) Interventions SDOH Screenings   Food Insecurity: Patient Unable To Answer (07/14/2023)  Housing: Unknown (07/08/2023)  Transportation Needs: No Transportation Needs (07/08/2023)  Utilities: Not At Risk (07/08/2023)  Depression (PHQ2-9): Low Risk  (07/08/2023)  Tobacco Use: Medium Risk (07/14/2023)    Readmission Risk Interventions     No data to display

## 2023-07-26 ENCOUNTER — Telehealth: Payer: Self-pay | Admitting: *Deleted

## 2023-07-26 DIAGNOSIS — R262 Difficulty in walking, not elsewhere classified: Secondary | ICD-10-CM | POA: Diagnosis not present

## 2023-07-26 DIAGNOSIS — G9341 Metabolic encephalopathy: Secondary | ICD-10-CM | POA: Diagnosis not present

## 2023-07-26 DIAGNOSIS — E538 Deficiency of other specified B group vitamins: Secondary | ICD-10-CM | POA: Diagnosis not present

## 2023-07-26 DIAGNOSIS — M51369 Other intervertebral disc degeneration, lumbar region without mention of lumbar back pain or lower extremity pain: Secondary | ICD-10-CM | POA: Diagnosis not present

## 2023-07-26 DIAGNOSIS — M810 Age-related osteoporosis without current pathological fracture: Secondary | ICD-10-CM | POA: Diagnosis not present

## 2023-07-26 DIAGNOSIS — I82503 Chronic embolism and thrombosis of unspecified deep veins of lower extremity, bilateral: Secondary | ICD-10-CM | POA: Diagnosis not present

## 2023-07-26 DIAGNOSIS — M160 Bilateral primary osteoarthritis of hip: Secondary | ICD-10-CM | POA: Diagnosis not present

## 2023-07-26 DIAGNOSIS — I10 Essential (primary) hypertension: Secondary | ICD-10-CM | POA: Diagnosis not present

## 2023-07-26 DIAGNOSIS — J449 Chronic obstructive pulmonary disease, unspecified: Secondary | ICD-10-CM | POA: Diagnosis not present

## 2023-07-26 DIAGNOSIS — F419 Anxiety disorder, unspecified: Secondary | ICD-10-CM | POA: Diagnosis not present

## 2023-07-26 DIAGNOSIS — I2782 Chronic pulmonary embolism: Secondary | ICD-10-CM | POA: Diagnosis not present

## 2023-07-26 DIAGNOSIS — G3184 Mild cognitive impairment, so stated: Secondary | ICD-10-CM | POA: Diagnosis not present

## 2023-07-27 DIAGNOSIS — I82503 Chronic embolism and thrombosis of unspecified deep veins of lower extremity, bilateral: Secondary | ICD-10-CM | POA: Diagnosis not present

## 2023-07-27 DIAGNOSIS — E538 Deficiency of other specified B group vitamins: Secondary | ICD-10-CM | POA: Diagnosis not present

## 2023-07-27 DIAGNOSIS — J449 Chronic obstructive pulmonary disease, unspecified: Secondary | ICD-10-CM | POA: Diagnosis not present

## 2023-07-27 DIAGNOSIS — I2782 Chronic pulmonary embolism: Secondary | ICD-10-CM | POA: Diagnosis not present

## 2023-07-27 DIAGNOSIS — F419 Anxiety disorder, unspecified: Secondary | ICD-10-CM | POA: Diagnosis not present

## 2023-07-27 DIAGNOSIS — M51369 Other intervertebral disc degeneration, lumbar region without mention of lumbar back pain or lower extremity pain: Secondary | ICD-10-CM | POA: Diagnosis not present

## 2023-07-27 DIAGNOSIS — I10 Essential (primary) hypertension: Secondary | ICD-10-CM | POA: Diagnosis not present

## 2023-07-27 DIAGNOSIS — R262 Difficulty in walking, not elsewhere classified: Secondary | ICD-10-CM | POA: Diagnosis not present

## 2023-07-27 DIAGNOSIS — M160 Bilateral primary osteoarthritis of hip: Secondary | ICD-10-CM | POA: Diagnosis not present

## 2023-07-27 DIAGNOSIS — G3184 Mild cognitive impairment, so stated: Secondary | ICD-10-CM | POA: Diagnosis not present

## 2023-07-27 DIAGNOSIS — G9341 Metabolic encephalopathy: Secondary | ICD-10-CM | POA: Diagnosis not present

## 2023-07-27 DIAGNOSIS — M810 Age-related osteoporosis without current pathological fracture: Secondary | ICD-10-CM | POA: Diagnosis not present

## 2023-07-28 DIAGNOSIS — G3184 Mild cognitive impairment, so stated: Secondary | ICD-10-CM | POA: Diagnosis not present

## 2023-07-28 DIAGNOSIS — M810 Age-related osteoporosis without current pathological fracture: Secondary | ICD-10-CM | POA: Diagnosis not present

## 2023-07-28 DIAGNOSIS — R262 Difficulty in walking, not elsewhere classified: Secondary | ICD-10-CM | POA: Diagnosis not present

## 2023-07-28 DIAGNOSIS — M160 Bilateral primary osteoarthritis of hip: Secondary | ICD-10-CM | POA: Diagnosis not present

## 2023-07-28 DIAGNOSIS — I10 Essential (primary) hypertension: Secondary | ICD-10-CM | POA: Diagnosis not present

## 2023-07-28 DIAGNOSIS — F419 Anxiety disorder, unspecified: Secondary | ICD-10-CM | POA: Diagnosis not present

## 2023-07-28 DIAGNOSIS — J449 Chronic obstructive pulmonary disease, unspecified: Secondary | ICD-10-CM | POA: Diagnosis not present

## 2023-07-28 DIAGNOSIS — G9341 Metabolic encephalopathy: Secondary | ICD-10-CM | POA: Diagnosis not present

## 2023-07-28 DIAGNOSIS — I2782 Chronic pulmonary embolism: Secondary | ICD-10-CM | POA: Diagnosis not present

## 2023-07-28 DIAGNOSIS — E538 Deficiency of other specified B group vitamins: Secondary | ICD-10-CM | POA: Diagnosis not present

## 2023-07-28 DIAGNOSIS — M51369 Other intervertebral disc degeneration, lumbar region without mention of lumbar back pain or lower extremity pain: Secondary | ICD-10-CM | POA: Diagnosis not present

## 2023-07-28 DIAGNOSIS — I82503 Chronic embolism and thrombosis of unspecified deep veins of lower extremity, bilateral: Secondary | ICD-10-CM | POA: Diagnosis not present

## 2023-07-29 DIAGNOSIS — R262 Difficulty in walking, not elsewhere classified: Secondary | ICD-10-CM | POA: Diagnosis not present

## 2023-07-29 DIAGNOSIS — I2782 Chronic pulmonary embolism: Secondary | ICD-10-CM | POA: Diagnosis not present

## 2023-07-29 DIAGNOSIS — G9341 Metabolic encephalopathy: Secondary | ICD-10-CM | POA: Diagnosis not present

## 2023-07-29 DIAGNOSIS — M160 Bilateral primary osteoarthritis of hip: Secondary | ICD-10-CM | POA: Diagnosis not present

## 2023-07-29 DIAGNOSIS — F419 Anxiety disorder, unspecified: Secondary | ICD-10-CM | POA: Diagnosis not present

## 2023-07-29 DIAGNOSIS — I82503 Chronic embolism and thrombosis of unspecified deep veins of lower extremity, bilateral: Secondary | ICD-10-CM | POA: Diagnosis not present

## 2023-07-29 DIAGNOSIS — J449 Chronic obstructive pulmonary disease, unspecified: Secondary | ICD-10-CM | POA: Diagnosis not present

## 2023-07-29 DIAGNOSIS — E538 Deficiency of other specified B group vitamins: Secondary | ICD-10-CM | POA: Diagnosis not present

## 2023-07-29 DIAGNOSIS — I10 Essential (primary) hypertension: Secondary | ICD-10-CM | POA: Diagnosis not present

## 2023-07-29 DIAGNOSIS — M51369 Other intervertebral disc degeneration, lumbar region without mention of lumbar back pain or lower extremity pain: Secondary | ICD-10-CM | POA: Diagnosis not present

## 2023-07-29 DIAGNOSIS — M810 Age-related osteoporosis without current pathological fracture: Secondary | ICD-10-CM | POA: Diagnosis not present

## 2023-07-29 DIAGNOSIS — G3184 Mild cognitive impairment, so stated: Secondary | ICD-10-CM | POA: Diagnosis not present

## 2023-07-31 DIAGNOSIS — R41841 Cognitive communication deficit: Secondary | ICD-10-CM | POA: Diagnosis not present

## 2023-07-31 DIAGNOSIS — E785 Hyperlipidemia, unspecified: Secondary | ICD-10-CM | POA: Diagnosis not present

## 2023-07-31 DIAGNOSIS — J449 Chronic obstructive pulmonary disease, unspecified: Secondary | ICD-10-CM | POA: Diagnosis not present

## 2023-07-31 DIAGNOSIS — E538 Deficiency of other specified B group vitamins: Secondary | ICD-10-CM | POA: Diagnosis not present

## 2023-07-31 DIAGNOSIS — M51369 Other intervertebral disc degeneration, lumbar region without mention of lumbar back pain or lower extremity pain: Secondary | ICD-10-CM | POA: Diagnosis not present

## 2023-07-31 DIAGNOSIS — M810 Age-related osteoporosis without current pathological fracture: Secondary | ICD-10-CM | POA: Diagnosis not present

## 2023-07-31 DIAGNOSIS — I82503 Chronic embolism and thrombosis of unspecified deep veins of lower extremity, bilateral: Secondary | ICD-10-CM | POA: Diagnosis not present

## 2023-07-31 DIAGNOSIS — I2782 Chronic pulmonary embolism: Secondary | ICD-10-CM | POA: Diagnosis not present

## 2023-07-31 DIAGNOSIS — M160 Bilateral primary osteoarthritis of hip: Secondary | ICD-10-CM | POA: Diagnosis not present

## 2023-07-31 DIAGNOSIS — G9341 Metabolic encephalopathy: Secondary | ICD-10-CM | POA: Diagnosis not present

## 2023-07-31 DIAGNOSIS — I10 Essential (primary) hypertension: Secondary | ICD-10-CM | POA: Diagnosis not present

## 2023-07-31 DIAGNOSIS — G3184 Mild cognitive impairment, so stated: Secondary | ICD-10-CM | POA: Diagnosis not present

## 2023-07-31 DIAGNOSIS — F419 Anxiety disorder, unspecified: Secondary | ICD-10-CM | POA: Diagnosis not present

## 2023-08-01 DIAGNOSIS — I10 Essential (primary) hypertension: Secondary | ICD-10-CM | POA: Diagnosis not present

## 2023-08-01 DIAGNOSIS — I2782 Chronic pulmonary embolism: Secondary | ICD-10-CM | POA: Diagnosis not present

## 2023-08-01 DIAGNOSIS — M160 Bilateral primary osteoarthritis of hip: Secondary | ICD-10-CM | POA: Diagnosis not present

## 2023-08-01 DIAGNOSIS — E538 Deficiency of other specified B group vitamins: Secondary | ICD-10-CM | POA: Diagnosis not present

## 2023-08-01 DIAGNOSIS — I82503 Chronic embolism and thrombosis of unspecified deep veins of lower extremity, bilateral: Secondary | ICD-10-CM | POA: Diagnosis not present

## 2023-08-01 DIAGNOSIS — F419 Anxiety disorder, unspecified: Secondary | ICD-10-CM | POA: Diagnosis not present

## 2023-08-01 DIAGNOSIS — J449 Chronic obstructive pulmonary disease, unspecified: Secondary | ICD-10-CM | POA: Diagnosis not present

## 2023-08-01 DIAGNOSIS — M810 Age-related osteoporosis without current pathological fracture: Secondary | ICD-10-CM | POA: Diagnosis not present

## 2023-08-01 DIAGNOSIS — M51369 Other intervertebral disc degeneration, lumbar region without mention of lumbar back pain or lower extremity pain: Secondary | ICD-10-CM | POA: Diagnosis not present

## 2023-08-01 DIAGNOSIS — R262 Difficulty in walking, not elsewhere classified: Secondary | ICD-10-CM | POA: Diagnosis not present

## 2023-08-01 DIAGNOSIS — G3184 Mild cognitive impairment, so stated: Secondary | ICD-10-CM | POA: Diagnosis not present

## 2023-08-01 DIAGNOSIS — G9341 Metabolic encephalopathy: Secondary | ICD-10-CM | POA: Diagnosis not present

## 2023-08-03 DIAGNOSIS — M810 Age-related osteoporosis without current pathological fracture: Secondary | ICD-10-CM | POA: Diagnosis not present

## 2023-08-03 DIAGNOSIS — I82503 Chronic embolism and thrombosis of unspecified deep veins of lower extremity, bilateral: Secondary | ICD-10-CM | POA: Diagnosis not present

## 2023-08-03 DIAGNOSIS — I2782 Chronic pulmonary embolism: Secondary | ICD-10-CM | POA: Diagnosis not present

## 2023-08-03 DIAGNOSIS — M51369 Other intervertebral disc degeneration, lumbar region without mention of lumbar back pain or lower extremity pain: Secondary | ICD-10-CM | POA: Diagnosis not present

## 2023-08-03 DIAGNOSIS — G3184 Mild cognitive impairment, so stated: Secondary | ICD-10-CM | POA: Diagnosis not present

## 2023-08-03 DIAGNOSIS — I10 Essential (primary) hypertension: Secondary | ICD-10-CM | POA: Diagnosis not present

## 2023-08-03 DIAGNOSIS — J449 Chronic obstructive pulmonary disease, unspecified: Secondary | ICD-10-CM | POA: Diagnosis not present

## 2023-08-03 DIAGNOSIS — R262 Difficulty in walking, not elsewhere classified: Secondary | ICD-10-CM | POA: Diagnosis not present

## 2023-08-03 DIAGNOSIS — E538 Deficiency of other specified B group vitamins: Secondary | ICD-10-CM | POA: Diagnosis not present

## 2023-08-03 DIAGNOSIS — F419 Anxiety disorder, unspecified: Secondary | ICD-10-CM | POA: Diagnosis not present

## 2023-08-03 DIAGNOSIS — G9341 Metabolic encephalopathy: Secondary | ICD-10-CM | POA: Diagnosis not present

## 2023-08-03 DIAGNOSIS — M160 Bilateral primary osteoarthritis of hip: Secondary | ICD-10-CM | POA: Diagnosis not present

## 2023-08-05 DIAGNOSIS — F419 Anxiety disorder, unspecified: Secondary | ICD-10-CM | POA: Diagnosis not present

## 2023-08-05 DIAGNOSIS — M51369 Other intervertebral disc degeneration, lumbar region without mention of lumbar back pain or lower extremity pain: Secondary | ICD-10-CM | POA: Diagnosis not present

## 2023-08-05 DIAGNOSIS — I82503 Chronic embolism and thrombosis of unspecified deep veins of lower extremity, bilateral: Secondary | ICD-10-CM | POA: Diagnosis not present

## 2023-08-05 DIAGNOSIS — M810 Age-related osteoporosis without current pathological fracture: Secondary | ICD-10-CM | POA: Diagnosis not present

## 2023-08-05 DIAGNOSIS — R262 Difficulty in walking, not elsewhere classified: Secondary | ICD-10-CM | POA: Diagnosis not present

## 2023-08-05 DIAGNOSIS — G3184 Mild cognitive impairment, so stated: Secondary | ICD-10-CM | POA: Diagnosis not present

## 2023-08-05 DIAGNOSIS — E538 Deficiency of other specified B group vitamins: Secondary | ICD-10-CM | POA: Diagnosis not present

## 2023-08-05 DIAGNOSIS — M160 Bilateral primary osteoarthritis of hip: Secondary | ICD-10-CM | POA: Diagnosis not present

## 2023-08-05 DIAGNOSIS — I2782 Chronic pulmonary embolism: Secondary | ICD-10-CM | POA: Diagnosis not present

## 2023-08-05 DIAGNOSIS — I10 Essential (primary) hypertension: Secondary | ICD-10-CM | POA: Diagnosis not present

## 2023-08-05 DIAGNOSIS — G9341 Metabolic encephalopathy: Secondary | ICD-10-CM | POA: Diagnosis not present

## 2023-08-05 DIAGNOSIS — J449 Chronic obstructive pulmonary disease, unspecified: Secondary | ICD-10-CM | POA: Diagnosis not present

## 2023-08-08 ENCOUNTER — Ambulatory Visit: Payer: Medicare (Managed Care) | Admitting: Nurse Practitioner

## 2023-08-08 DIAGNOSIS — J449 Chronic obstructive pulmonary disease, unspecified: Secondary | ICD-10-CM | POA: Diagnosis not present

## 2023-08-08 DIAGNOSIS — R262 Difficulty in walking, not elsewhere classified: Secondary | ICD-10-CM | POA: Diagnosis not present

## 2023-08-08 DIAGNOSIS — M160 Bilateral primary osteoarthritis of hip: Secondary | ICD-10-CM | POA: Diagnosis not present

## 2023-08-08 DIAGNOSIS — I2782 Chronic pulmonary embolism: Secondary | ICD-10-CM | POA: Diagnosis not present

## 2023-08-08 DIAGNOSIS — I82503 Chronic embolism and thrombosis of unspecified deep veins of lower extremity, bilateral: Secondary | ICD-10-CM | POA: Diagnosis not present

## 2023-08-08 DIAGNOSIS — I10 Essential (primary) hypertension: Secondary | ICD-10-CM | POA: Diagnosis not present

## 2023-08-08 DIAGNOSIS — M51369 Other intervertebral disc degeneration, lumbar region without mention of lumbar back pain or lower extremity pain: Secondary | ICD-10-CM | POA: Diagnosis not present

## 2023-08-08 DIAGNOSIS — E538 Deficiency of other specified B group vitamins: Secondary | ICD-10-CM | POA: Diagnosis not present

## 2023-08-08 DIAGNOSIS — M810 Age-related osteoporosis without current pathological fracture: Secondary | ICD-10-CM | POA: Diagnosis not present

## 2023-08-08 DIAGNOSIS — G9341 Metabolic encephalopathy: Secondary | ICD-10-CM | POA: Diagnosis not present

## 2023-08-08 DIAGNOSIS — F419 Anxiety disorder, unspecified: Secondary | ICD-10-CM | POA: Diagnosis not present

## 2023-08-08 DIAGNOSIS — G3184 Mild cognitive impairment, so stated: Secondary | ICD-10-CM | POA: Diagnosis not present

## 2023-08-10 DIAGNOSIS — G9341 Metabolic encephalopathy: Secondary | ICD-10-CM | POA: Diagnosis not present

## 2023-08-10 DIAGNOSIS — G3184 Mild cognitive impairment, so stated: Secondary | ICD-10-CM | POA: Diagnosis not present

## 2023-08-10 DIAGNOSIS — I82503 Chronic embolism and thrombosis of unspecified deep veins of lower extremity, bilateral: Secondary | ICD-10-CM | POA: Diagnosis not present

## 2023-08-10 DIAGNOSIS — M160 Bilateral primary osteoarthritis of hip: Secondary | ICD-10-CM | POA: Diagnosis not present

## 2023-08-10 DIAGNOSIS — M5030 Other cervical disc degeneration, unspecified cervical region: Secondary | ICD-10-CM | POA: Diagnosis not present

## 2023-08-10 DIAGNOSIS — I351 Nonrheumatic aortic (valve) insufficiency: Secondary | ICD-10-CM | POA: Diagnosis not present

## 2023-08-10 DIAGNOSIS — Z9181 History of falling: Secondary | ICD-10-CM | POA: Diagnosis not present

## 2023-08-10 DIAGNOSIS — I2782 Chronic pulmonary embolism: Secondary | ICD-10-CM | POA: Diagnosis not present

## 2023-08-10 DIAGNOSIS — R262 Difficulty in walking, not elsewhere classified: Secondary | ICD-10-CM | POA: Diagnosis not present

## 2023-08-10 DIAGNOSIS — F0394 Unspecified dementia, unspecified severity, with anxiety: Secondary | ICD-10-CM | POA: Diagnosis not present

## 2023-08-10 DIAGNOSIS — J449 Chronic obstructive pulmonary disease, unspecified: Secondary | ICD-10-CM | POA: Diagnosis not present

## 2023-08-10 DIAGNOSIS — M810 Age-related osteoporosis without current pathological fracture: Secondary | ICD-10-CM | POA: Diagnosis not present

## 2023-08-10 DIAGNOSIS — I129 Hypertensive chronic kidney disease with stage 1 through stage 4 chronic kidney disease, or unspecified chronic kidney disease: Secondary | ICD-10-CM | POA: Diagnosis not present

## 2023-08-10 DIAGNOSIS — F419 Anxiety disorder, unspecified: Secondary | ICD-10-CM | POA: Diagnosis not present

## 2023-08-10 DIAGNOSIS — I10 Essential (primary) hypertension: Secondary | ICD-10-CM | POA: Diagnosis not present

## 2023-08-10 DIAGNOSIS — M51369 Other intervertebral disc degeneration, lumbar region without mention of lumbar back pain or lower extremity pain: Secondary | ICD-10-CM | POA: Diagnosis not present

## 2023-08-10 DIAGNOSIS — E538 Deficiency of other specified B group vitamins: Secondary | ICD-10-CM | POA: Diagnosis not present

## 2023-08-10 DIAGNOSIS — N183 Chronic kidney disease, stage 3 unspecified: Secondary | ICD-10-CM | POA: Diagnosis not present

## 2023-08-12 DIAGNOSIS — I82503 Chronic embolism and thrombosis of unspecified deep veins of lower extremity, bilateral: Secondary | ICD-10-CM | POA: Diagnosis not present

## 2023-08-12 DIAGNOSIS — M810 Age-related osteoporosis without current pathological fracture: Secondary | ICD-10-CM | POA: Diagnosis not present

## 2023-08-12 DIAGNOSIS — J449 Chronic obstructive pulmonary disease, unspecified: Secondary | ICD-10-CM | POA: Diagnosis not present

## 2023-08-12 DIAGNOSIS — G3184 Mild cognitive impairment, so stated: Secondary | ICD-10-CM | POA: Diagnosis not present

## 2023-08-12 DIAGNOSIS — E538 Deficiency of other specified B group vitamins: Secondary | ICD-10-CM | POA: Diagnosis not present

## 2023-08-12 DIAGNOSIS — M160 Bilateral primary osteoarthritis of hip: Secondary | ICD-10-CM | POA: Diagnosis not present

## 2023-08-12 DIAGNOSIS — I2782 Chronic pulmonary embolism: Secondary | ICD-10-CM | POA: Diagnosis not present

## 2023-08-12 DIAGNOSIS — G9341 Metabolic encephalopathy: Secondary | ICD-10-CM | POA: Diagnosis not present

## 2023-08-12 DIAGNOSIS — F419 Anxiety disorder, unspecified: Secondary | ICD-10-CM | POA: Diagnosis not present

## 2023-08-12 DIAGNOSIS — R262 Difficulty in walking, not elsewhere classified: Secondary | ICD-10-CM | POA: Diagnosis not present

## 2023-08-12 DIAGNOSIS — I10 Essential (primary) hypertension: Secondary | ICD-10-CM | POA: Diagnosis not present

## 2023-08-12 DIAGNOSIS — M51369 Other intervertebral disc degeneration, lumbar region without mention of lumbar back pain or lower extremity pain: Secondary | ICD-10-CM | POA: Diagnosis not present

## 2023-08-15 DIAGNOSIS — M51369 Other intervertebral disc degeneration, lumbar region without mention of lumbar back pain or lower extremity pain: Secondary | ICD-10-CM | POA: Diagnosis not present

## 2023-08-15 DIAGNOSIS — M810 Age-related osteoporosis without current pathological fracture: Secondary | ICD-10-CM | POA: Diagnosis not present

## 2023-08-15 DIAGNOSIS — J449 Chronic obstructive pulmonary disease, unspecified: Secondary | ICD-10-CM | POA: Diagnosis not present

## 2023-08-15 DIAGNOSIS — F419 Anxiety disorder, unspecified: Secondary | ICD-10-CM | POA: Diagnosis not present

## 2023-08-15 DIAGNOSIS — E538 Deficiency of other specified B group vitamins: Secondary | ICD-10-CM | POA: Diagnosis not present

## 2023-08-15 DIAGNOSIS — I10 Essential (primary) hypertension: Secondary | ICD-10-CM | POA: Diagnosis not present

## 2023-08-15 DIAGNOSIS — R262 Difficulty in walking, not elsewhere classified: Secondary | ICD-10-CM | POA: Diagnosis not present

## 2023-08-15 DIAGNOSIS — G9341 Metabolic encephalopathy: Secondary | ICD-10-CM | POA: Diagnosis not present

## 2023-08-15 DIAGNOSIS — I2782 Chronic pulmonary embolism: Secondary | ICD-10-CM | POA: Diagnosis not present

## 2023-08-15 DIAGNOSIS — M160 Bilateral primary osteoarthritis of hip: Secondary | ICD-10-CM | POA: Diagnosis not present

## 2023-08-15 DIAGNOSIS — G3184 Mild cognitive impairment, so stated: Secondary | ICD-10-CM | POA: Diagnosis not present

## 2023-08-15 DIAGNOSIS — I82503 Chronic embolism and thrombosis of unspecified deep veins of lower extremity, bilateral: Secondary | ICD-10-CM | POA: Diagnosis not present

## 2023-08-17 DIAGNOSIS — G9341 Metabolic encephalopathy: Secondary | ICD-10-CM | POA: Diagnosis not present

## 2023-08-17 DIAGNOSIS — M810 Age-related osteoporosis without current pathological fracture: Secondary | ICD-10-CM | POA: Diagnosis not present

## 2023-08-17 DIAGNOSIS — I82503 Chronic embolism and thrombosis of unspecified deep veins of lower extremity, bilateral: Secondary | ICD-10-CM | POA: Diagnosis not present

## 2023-08-17 DIAGNOSIS — E538 Deficiency of other specified B group vitamins: Secondary | ICD-10-CM | POA: Diagnosis not present

## 2023-08-17 DIAGNOSIS — M51369 Other intervertebral disc degeneration, lumbar region without mention of lumbar back pain or lower extremity pain: Secondary | ICD-10-CM | POA: Diagnosis not present

## 2023-08-17 DIAGNOSIS — M160 Bilateral primary osteoarthritis of hip: Secondary | ICD-10-CM | POA: Diagnosis not present

## 2023-08-17 DIAGNOSIS — F419 Anxiety disorder, unspecified: Secondary | ICD-10-CM | POA: Diagnosis not present

## 2023-08-17 DIAGNOSIS — R262 Difficulty in walking, not elsewhere classified: Secondary | ICD-10-CM | POA: Diagnosis not present

## 2023-08-17 DIAGNOSIS — I10 Essential (primary) hypertension: Secondary | ICD-10-CM | POA: Diagnosis not present

## 2023-08-17 DIAGNOSIS — G3184 Mild cognitive impairment, so stated: Secondary | ICD-10-CM | POA: Diagnosis not present

## 2023-08-17 DIAGNOSIS — J449 Chronic obstructive pulmonary disease, unspecified: Secondary | ICD-10-CM | POA: Diagnosis not present

## 2023-08-17 DIAGNOSIS — I2782 Chronic pulmonary embolism: Secondary | ICD-10-CM | POA: Diagnosis not present

## 2023-08-19 DIAGNOSIS — G3184 Mild cognitive impairment, so stated: Secondary | ICD-10-CM | POA: Diagnosis not present

## 2023-08-19 DIAGNOSIS — M810 Age-related osteoporosis without current pathological fracture: Secondary | ICD-10-CM | POA: Diagnosis not present

## 2023-08-19 DIAGNOSIS — I82503 Chronic embolism and thrombosis of unspecified deep veins of lower extremity, bilateral: Secondary | ICD-10-CM | POA: Diagnosis not present

## 2023-08-19 DIAGNOSIS — J449 Chronic obstructive pulmonary disease, unspecified: Secondary | ICD-10-CM | POA: Diagnosis not present

## 2023-08-19 DIAGNOSIS — M160 Bilateral primary osteoarthritis of hip: Secondary | ICD-10-CM | POA: Diagnosis not present

## 2023-08-19 DIAGNOSIS — I2782 Chronic pulmonary embolism: Secondary | ICD-10-CM | POA: Diagnosis not present

## 2023-08-19 DIAGNOSIS — R262 Difficulty in walking, not elsewhere classified: Secondary | ICD-10-CM | POA: Diagnosis not present

## 2023-08-19 DIAGNOSIS — I10 Essential (primary) hypertension: Secondary | ICD-10-CM | POA: Diagnosis not present

## 2023-08-19 DIAGNOSIS — F419 Anxiety disorder, unspecified: Secondary | ICD-10-CM | POA: Diagnosis not present

## 2023-08-19 DIAGNOSIS — M51369 Other intervertebral disc degeneration, lumbar region without mention of lumbar back pain or lower extremity pain: Secondary | ICD-10-CM | POA: Diagnosis not present

## 2023-08-19 DIAGNOSIS — E538 Deficiency of other specified B group vitamins: Secondary | ICD-10-CM | POA: Diagnosis not present

## 2023-08-19 DIAGNOSIS — G9341 Metabolic encephalopathy: Secondary | ICD-10-CM | POA: Diagnosis not present

## 2023-08-22 DIAGNOSIS — M51369 Other intervertebral disc degeneration, lumbar region without mention of lumbar back pain or lower extremity pain: Secondary | ICD-10-CM | POA: Diagnosis not present

## 2023-08-22 DIAGNOSIS — E538 Deficiency of other specified B group vitamins: Secondary | ICD-10-CM | POA: Diagnosis not present

## 2023-08-22 DIAGNOSIS — M160 Bilateral primary osteoarthritis of hip: Secondary | ICD-10-CM | POA: Diagnosis not present

## 2023-08-22 DIAGNOSIS — M810 Age-related osteoporosis without current pathological fracture: Secondary | ICD-10-CM | POA: Diagnosis not present

## 2023-08-22 DIAGNOSIS — G3184 Mild cognitive impairment, so stated: Secondary | ICD-10-CM | POA: Diagnosis not present

## 2023-08-22 DIAGNOSIS — I82503 Chronic embolism and thrombosis of unspecified deep veins of lower extremity, bilateral: Secondary | ICD-10-CM | POA: Diagnosis not present

## 2023-08-22 DIAGNOSIS — I2782 Chronic pulmonary embolism: Secondary | ICD-10-CM | POA: Diagnosis not present

## 2023-08-22 DIAGNOSIS — F419 Anxiety disorder, unspecified: Secondary | ICD-10-CM | POA: Diagnosis not present

## 2023-08-22 DIAGNOSIS — I10 Essential (primary) hypertension: Secondary | ICD-10-CM | POA: Diagnosis not present

## 2023-08-22 DIAGNOSIS — J449 Chronic obstructive pulmonary disease, unspecified: Secondary | ICD-10-CM | POA: Diagnosis not present

## 2023-08-22 DIAGNOSIS — G9341 Metabolic encephalopathy: Secondary | ICD-10-CM | POA: Diagnosis not present

## 2023-08-22 DIAGNOSIS — R262 Difficulty in walking, not elsewhere classified: Secondary | ICD-10-CM | POA: Diagnosis not present

## 2023-08-23 ENCOUNTER — Telehealth: Payer: Self-pay | Admitting: *Deleted

## 2023-08-23 ENCOUNTER — Encounter: Payer: Self-pay | Admitting: *Deleted

## 2023-08-23 DIAGNOSIS — J449 Chronic obstructive pulmonary disease, unspecified: Secondary | ICD-10-CM | POA: Diagnosis not present

## 2023-08-23 DIAGNOSIS — G9341 Metabolic encephalopathy: Secondary | ICD-10-CM | POA: Diagnosis not present

## 2023-08-23 DIAGNOSIS — I82503 Chronic embolism and thrombosis of unspecified deep veins of lower extremity, bilateral: Secondary | ICD-10-CM | POA: Diagnosis not present

## 2023-08-23 DIAGNOSIS — E538 Deficiency of other specified B group vitamins: Secondary | ICD-10-CM | POA: Diagnosis not present

## 2023-08-23 DIAGNOSIS — M810 Age-related osteoporosis without current pathological fracture: Secondary | ICD-10-CM | POA: Diagnosis not present

## 2023-08-23 DIAGNOSIS — G3184 Mild cognitive impairment, so stated: Secondary | ICD-10-CM | POA: Diagnosis not present

## 2023-08-23 DIAGNOSIS — R262 Difficulty in walking, not elsewhere classified: Secondary | ICD-10-CM | POA: Diagnosis not present

## 2023-08-23 DIAGNOSIS — M160 Bilateral primary osteoarthritis of hip: Secondary | ICD-10-CM | POA: Diagnosis not present

## 2023-08-23 DIAGNOSIS — M51369 Other intervertebral disc degeneration, lumbar region without mention of lumbar back pain or lower extremity pain: Secondary | ICD-10-CM | POA: Diagnosis not present

## 2023-08-23 DIAGNOSIS — I10 Essential (primary) hypertension: Secondary | ICD-10-CM | POA: Diagnosis not present

## 2023-08-23 DIAGNOSIS — I2782 Chronic pulmonary embolism: Secondary | ICD-10-CM | POA: Diagnosis not present

## 2023-08-23 DIAGNOSIS — F419 Anxiety disorder, unspecified: Secondary | ICD-10-CM | POA: Diagnosis not present

## 2023-08-25 DIAGNOSIS — R41841 Cognitive communication deficit: Secondary | ICD-10-CM | POA: Diagnosis not present

## 2023-08-25 DIAGNOSIS — G9341 Metabolic encephalopathy: Secondary | ICD-10-CM | POA: Diagnosis not present

## 2023-08-25 DIAGNOSIS — R262 Difficulty in walking, not elsewhere classified: Secondary | ICD-10-CM | POA: Diagnosis not present

## 2023-08-25 DIAGNOSIS — R278 Other lack of coordination: Secondary | ICD-10-CM | POA: Diagnosis not present

## 2023-08-26 DIAGNOSIS — M51369 Other intervertebral disc degeneration, lumbar region without mention of lumbar back pain or lower extremity pain: Secondary | ICD-10-CM | POA: Diagnosis not present

## 2023-08-26 DIAGNOSIS — R262 Difficulty in walking, not elsewhere classified: Secondary | ICD-10-CM | POA: Diagnosis not present

## 2023-08-26 DIAGNOSIS — M160 Bilateral primary osteoarthritis of hip: Secondary | ICD-10-CM | POA: Diagnosis not present

## 2023-08-26 DIAGNOSIS — E538 Deficiency of other specified B group vitamins: Secondary | ICD-10-CM | POA: Diagnosis not present

## 2023-08-26 DIAGNOSIS — I2782 Chronic pulmonary embolism: Secondary | ICD-10-CM | POA: Diagnosis not present

## 2023-08-26 DIAGNOSIS — J449 Chronic obstructive pulmonary disease, unspecified: Secondary | ICD-10-CM | POA: Diagnosis not present

## 2023-08-26 DIAGNOSIS — G9341 Metabolic encephalopathy: Secondary | ICD-10-CM | POA: Diagnosis not present

## 2023-08-26 DIAGNOSIS — I10 Essential (primary) hypertension: Secondary | ICD-10-CM | POA: Diagnosis not present

## 2023-08-26 DIAGNOSIS — G3184 Mild cognitive impairment, so stated: Secondary | ICD-10-CM | POA: Diagnosis not present

## 2023-08-26 DIAGNOSIS — F419 Anxiety disorder, unspecified: Secondary | ICD-10-CM | POA: Diagnosis not present

## 2023-08-26 DIAGNOSIS — I82503 Chronic embolism and thrombosis of unspecified deep veins of lower extremity, bilateral: Secondary | ICD-10-CM | POA: Diagnosis not present

## 2023-08-26 DIAGNOSIS — M810 Age-related osteoporosis without current pathological fracture: Secondary | ICD-10-CM | POA: Diagnosis not present

## 2023-08-29 DIAGNOSIS — I82503 Chronic embolism and thrombosis of unspecified deep veins of lower extremity, bilateral: Secondary | ICD-10-CM | POA: Diagnosis not present

## 2023-08-29 DIAGNOSIS — E538 Deficiency of other specified B group vitamins: Secondary | ICD-10-CM | POA: Diagnosis not present

## 2023-08-29 DIAGNOSIS — I10 Essential (primary) hypertension: Secondary | ICD-10-CM | POA: Diagnosis not present

## 2023-08-29 DIAGNOSIS — G9341 Metabolic encephalopathy: Secondary | ICD-10-CM | POA: Diagnosis not present

## 2023-08-29 DIAGNOSIS — M160 Bilateral primary osteoarthritis of hip: Secondary | ICD-10-CM | POA: Diagnosis not present

## 2023-08-29 DIAGNOSIS — R262 Difficulty in walking, not elsewhere classified: Secondary | ICD-10-CM | POA: Diagnosis not present

## 2023-08-29 DIAGNOSIS — F419 Anxiety disorder, unspecified: Secondary | ICD-10-CM | POA: Diagnosis not present

## 2023-08-29 DIAGNOSIS — M810 Age-related osteoporosis without current pathological fracture: Secondary | ICD-10-CM | POA: Diagnosis not present

## 2023-08-29 DIAGNOSIS — M51369 Other intervertebral disc degeneration, lumbar region without mention of lumbar back pain or lower extremity pain: Secondary | ICD-10-CM | POA: Diagnosis not present

## 2023-08-29 DIAGNOSIS — G3184 Mild cognitive impairment, so stated: Secondary | ICD-10-CM | POA: Diagnosis not present

## 2023-08-29 DIAGNOSIS — J449 Chronic obstructive pulmonary disease, unspecified: Secondary | ICD-10-CM | POA: Diagnosis not present

## 2023-08-29 DIAGNOSIS — I2782 Chronic pulmonary embolism: Secondary | ICD-10-CM | POA: Diagnosis not present

## 2023-08-30 DIAGNOSIS — E538 Deficiency of other specified B group vitamins: Secondary | ICD-10-CM | POA: Diagnosis not present

## 2023-08-30 DIAGNOSIS — I82503 Chronic embolism and thrombosis of unspecified deep veins of lower extremity, bilateral: Secondary | ICD-10-CM | POA: Diagnosis not present

## 2023-08-30 DIAGNOSIS — R262 Difficulty in walking, not elsewhere classified: Secondary | ICD-10-CM | POA: Diagnosis not present

## 2023-08-30 DIAGNOSIS — I10 Essential (primary) hypertension: Secondary | ICD-10-CM | POA: Diagnosis not present

## 2023-08-30 DIAGNOSIS — M51369 Other intervertebral disc degeneration, lumbar region without mention of lumbar back pain or lower extremity pain: Secondary | ICD-10-CM | POA: Diagnosis not present

## 2023-08-30 DIAGNOSIS — F419 Anxiety disorder, unspecified: Secondary | ICD-10-CM | POA: Diagnosis not present

## 2023-08-30 DIAGNOSIS — G9341 Metabolic encephalopathy: Secondary | ICD-10-CM | POA: Diagnosis not present

## 2023-08-30 DIAGNOSIS — J449 Chronic obstructive pulmonary disease, unspecified: Secondary | ICD-10-CM | POA: Diagnosis not present

## 2023-08-30 DIAGNOSIS — M810 Age-related osteoporosis without current pathological fracture: Secondary | ICD-10-CM | POA: Diagnosis not present

## 2023-08-30 DIAGNOSIS — I2782 Chronic pulmonary embolism: Secondary | ICD-10-CM | POA: Diagnosis not present

## 2023-08-30 DIAGNOSIS — M160 Bilateral primary osteoarthritis of hip: Secondary | ICD-10-CM | POA: Diagnosis not present

## 2023-08-30 DIAGNOSIS — G3184 Mild cognitive impairment, so stated: Secondary | ICD-10-CM | POA: Diagnosis not present

## 2023-09-08 DIAGNOSIS — M810 Age-related osteoporosis without current pathological fracture: Secondary | ICD-10-CM | POA: Diagnosis not present

## 2023-09-08 DIAGNOSIS — Z556 Problems related to health literacy: Secondary | ICD-10-CM | POA: Diagnosis not present

## 2023-09-08 DIAGNOSIS — I351 Nonrheumatic aortic (valve) insufficiency: Secondary | ICD-10-CM | POA: Diagnosis not present

## 2023-09-08 DIAGNOSIS — R739 Hyperglycemia, unspecified: Secondary | ICD-10-CM | POA: Diagnosis not present

## 2023-09-08 DIAGNOSIS — I2782 Chronic pulmonary embolism: Secondary | ICD-10-CM | POA: Diagnosis not present

## 2023-09-08 DIAGNOSIS — J309 Allergic rhinitis, unspecified: Secondary | ICD-10-CM | POA: Diagnosis not present

## 2023-09-08 DIAGNOSIS — Z7901 Long term (current) use of anticoagulants: Secondary | ICD-10-CM | POA: Diagnosis not present

## 2023-09-08 DIAGNOSIS — M160 Bilateral primary osteoarthritis of hip: Secondary | ICD-10-CM | POA: Diagnosis not present

## 2023-09-08 DIAGNOSIS — Z90710 Acquired absence of both cervix and uterus: Secondary | ICD-10-CM | POA: Diagnosis not present

## 2023-09-08 DIAGNOSIS — Z87440 Personal history of urinary (tract) infections: Secondary | ICD-10-CM | POA: Diagnosis not present

## 2023-09-08 DIAGNOSIS — K5730 Diverticulosis of large intestine without perforation or abscess without bleeding: Secondary | ICD-10-CM | POA: Diagnosis not present

## 2023-09-08 DIAGNOSIS — Z9181 History of falling: Secondary | ICD-10-CM | POA: Diagnosis not present

## 2023-09-08 DIAGNOSIS — E785 Hyperlipidemia, unspecified: Secondary | ICD-10-CM | POA: Diagnosis not present

## 2023-09-16 ENCOUNTER — Encounter: Payer: Self-pay | Admitting: Internal Medicine

## 2023-09-16 NOTE — Telephone Encounter (Signed)
 Needs OV as it is not clear if the swelling is left over from the leg blood clot damage in may.  Please check daily wt at home in the AM after the first bathroom visit and bring to next appt.  Call if wt is increased by 3-5 lbs from day to day.

## 2023-09-22 ENCOUNTER — Ambulatory Visit: Payer: Self-pay | Admitting: Internal Medicine

## 2023-09-22 ENCOUNTER — Ambulatory Visit: Payer: Medicare (Managed Care) | Admitting: Internal Medicine

## 2023-09-22 ENCOUNTER — Other Ambulatory Visit: Payer: Self-pay | Admitting: Internal Medicine

## 2023-09-22 VITALS — HR 73 | Temp 98.4°F | Ht 63.0 in

## 2023-09-22 DIAGNOSIS — N1831 Chronic kidney disease, stage 3a: Secondary | ICD-10-CM

## 2023-09-22 DIAGNOSIS — R739 Hyperglycemia, unspecified: Secondary | ICD-10-CM

## 2023-09-22 DIAGNOSIS — F03A0 Unspecified dementia, mild, without behavioral disturbance, psychotic disturbance, mood disturbance, and anxiety: Secondary | ICD-10-CM | POA: Diagnosis not present

## 2023-09-22 DIAGNOSIS — R600 Localized edema: Secondary | ICD-10-CM | POA: Diagnosis not present

## 2023-09-22 DIAGNOSIS — I10 Essential (primary) hypertension: Secondary | ICD-10-CM | POA: Diagnosis not present

## 2023-09-22 DIAGNOSIS — E559 Vitamin D deficiency, unspecified: Secondary | ICD-10-CM | POA: Diagnosis not present

## 2023-09-22 DIAGNOSIS — E538 Deficiency of other specified B group vitamins: Secondary | ICD-10-CM | POA: Diagnosis not present

## 2023-09-22 DIAGNOSIS — F0390 Unspecified dementia without behavioral disturbance: Secondary | ICD-10-CM | POA: Insufficient documentation

## 2023-09-22 DIAGNOSIS — M1612 Unilateral primary osteoarthritis, left hip: Secondary | ICD-10-CM

## 2023-09-22 LAB — CBC WITH DIFFERENTIAL/PLATELET
Basophils Absolute: 0 K/uL (ref 0.0–0.1)
Basophils Relative: 0.6 % (ref 0.0–3.0)
Eosinophils Absolute: 0.1 K/uL (ref 0.0–0.7)
Eosinophils Relative: 3 % (ref 0.0–5.0)
HCT: 33 % — ABNORMAL LOW (ref 36.0–46.0)
Hemoglobin: 10.9 g/dL — ABNORMAL LOW (ref 12.0–15.0)
Lymphocytes Relative: 43.1 % (ref 12.0–46.0)
Lymphs Abs: 1.9 K/uL (ref 0.7–4.0)
MCHC: 33.1 g/dL (ref 30.0–36.0)
MCV: 88.5 fl (ref 78.0–100.0)
Monocytes Absolute: 0.5 K/uL (ref 0.1–1.0)
Monocytes Relative: 11.4 % (ref 3.0–12.0)
Neutro Abs: 1.8 K/uL (ref 1.4–7.7)
Neutrophils Relative %: 41.9 % — ABNORMAL LOW (ref 43.0–77.0)
Platelets: 153 K/uL (ref 150.0–400.0)
RBC: 3.73 Mil/uL — ABNORMAL LOW (ref 3.87–5.11)
RDW: 14.1 % (ref 11.5–15.5)
WBC: 4.3 K/uL (ref 4.0–10.5)

## 2023-09-22 LAB — HEPATIC FUNCTION PANEL
ALT: 10 U/L (ref 0–35)
AST: 17 U/L (ref 0–37)
Albumin: 4.2 g/dL (ref 3.5–5.2)
Alkaline Phosphatase: 62 U/L (ref 39–117)
Bilirubin, Direct: 0.2 mg/dL (ref 0.0–0.3)
Total Bilirubin: 0.5 mg/dL (ref 0.2–1.2)
Total Protein: 7.9 g/dL (ref 6.0–8.3)

## 2023-09-22 LAB — BASIC METABOLIC PANEL WITH GFR
BUN: 17 mg/dL (ref 6–23)
CO2: 25 meq/L (ref 19–32)
Calcium: 9.3 mg/dL (ref 8.4–10.5)
Chloride: 104 meq/L (ref 96–112)
Creatinine, Ser: 0.82 mg/dL (ref 0.40–1.20)
GFR: 63.88 mL/min (ref >60.00)
Glucose, Bld: 71 mg/dL (ref 70–99)
Potassium: 3.3 meq/L — ABNORMAL LOW (ref 3.5–5.1)
Sodium: 139 meq/L (ref 135–145)

## 2023-09-22 LAB — HEMOGLOBIN A1C: Hgb A1c MFr Bld: 5.3 % (ref 4.6–6.5)

## 2023-09-22 MED ORDER — DICLOFENAC SODIUM 1 % EX GEL: 2.0000 g | Freq: Four times a day (QID) | CUTANEOUS | Status: AC

## 2023-09-22 MED ORDER — POTASSIUM CHLORIDE ER 10 MEQ PO TBCR: 10.0000 meq | EXTENDED_RELEASE_TABLET | Freq: Every day | ORAL | 3 refills | Status: AC

## 2023-09-22 MED ORDER — FUROSEMIDE 20 MG PO TABS: 20.0000 mg | ORAL_TABLET | Freq: Every day | ORAL | 3 refills | Status: DC

## 2023-09-22 NOTE — Assessment & Plan Note (Signed)
 Worsening, left hip only though recent films with bilateral advanced djd, for referral orthopedic per daughter, pt does not want surgury but possible cortisone might be indicated

## 2023-09-22 NOTE — Progress Notes (Signed)
 Patient ID: Megan Baldwin, female   DOB: 1933/10/12, 89 y.o.   MRN: [id number]        Chief Complaint: follow up with left hip pain, worsening leg swelling, recent wt loss, dementia, low vit d, htn       HPI:  Megan Baldwin is a 89 y.o. female here overall doing ok but unfortunately with worsening leg swelling right > left with legs dependent, hx of LLE DVT and bilateral venous insufficiency.  Was better controlled with aldactone  previously before last hospital stay, now with swelling returned.  Pt denies chest pain, increased sob or doe, wheezing, orthopnea, PND, increased LE swelling, palpitations, dizziness or syncope.       Wt Readings from Last 3 Encounters:  07/14/23 150 lb (68 kg)  06/22/23 145 lb (65.8 kg)  05/13/23 147 lb (66.7 kg)   BP Readings from Last 3 Encounters:  07/22/23 124/77  06/22/23 128/78  05/13/23 (!) 144/72         Past Medical History:  Diagnosis Date   Allergic rhinitis    Anxiety    Aortic valve regurgitation 10/12/2010   CKD (chronic kidney disease) stage 3, GFR 30-59 ml/min (HCC) 09/26/2017   COPD (chronic obstructive pulmonary disease) (HCC)    Disc disease, degenerative, cervical    Diverticulosis of colon    DJD (degenerative joint disease)    LS spine   Gallstones    History of colonic polyps    Hyperlipidemia    Hypertension    Osteoporosis    Past Surgical History:  Procedure Laterality Date   ABDOMINAL HYSTERECTOMY     CATARACT EXTRACTION     COLONOSCOPY W/ POLYPECTOMY     EYE SURGERY     OOPHORECTOMY      reports that she has quit smoking. She has never used smokeless tobacco. She reports that she does not drink alcohol and does not use drugs. family history includes Cancer in her mother and another family member; Coronary artery disease in an other family member; Diabetes in her mother and another family member; Hypertension in an other family member; Parkinsonism in her father; Stroke in an other family member. No Known  Allergies Current Outpatient Medications on File Prior to Visit  Medication Sig Dispense Refill   acetaminophen  (TYLENOL ) 500 MG tablet Take 2 tablets (1,000 mg total) by mouth 3 (three) times daily.     amLODipine  (NORVASC ) 5 MG tablet Take 1 tablet (5 mg total) by mouth daily. 90 tablet 3   apixaban  (ELIQUIS ) 5 MG TABS tablet Take 1 tablet (5 mg total) by mouth 2 (two) times daily.     donepezil  (ARICEPT ) 5 MG tablet Take 1 tablet (5 mg total) by mouth at bedtime.     ipratropium-albuterol  (DUONEB) 0.5-2.5 (3) MG/3ML SOLN Take 3 mLs by nebulization every 6 (six) hours as needed.     rosuvastatin  (CRESTOR ) 10 MG tablet Take 1 tablet (10 mg total) by mouth daily.     senna-docusate (SENOKOT-S) 8.6-50 MG tablet Take 1 tablet by mouth at bedtime as needed for mild constipation.     vitamin B-12 (CYANOCOBALAMIN ) 1000 MCG tablet Take 1 tablet (1,000 mcg total) by mouth daily. 90 tablet 3   traMADol  (ULTRAM ) 50 MG tablet Take 1 tablet (50 mg total) by mouth every 6 (six) hours as needed (for pain). (Patient not taking: Reported on 09/22/2023) 30 tablet 0   No current facility-administered medications on file prior to visit.        ROS:  All others reviewed and negative.  Objective        PE:  Pulse 73   Temp 98.4 F (36.9 C)   Ht 5' 3 (1.6 m)   SpO2 90%   BMI 26.57 kg/m                 Constitutional: Pt appears in NAD               HENT: Head: NCAT.                Right Ear: External ear normal.                 Left Ear: External ear normal.                Eyes: . Pupils are equal, round, and reactive to light. Conjunctivae and EOM are normal               Nose: without d/c or deformity               Neck: Neck supple. Gross normal ROM               Cardiovascular: Normal rate and regular rhythm.                 Pulmonary/Chest: Effort normal and breath sounds without rales or wheezing.                Abd:  Soft, NT, ND, + BS, no organomegaly               Neurological: Pt is alert.  At baseline orientation, motor grossly intact               Skin: Skin is warm. No rashes, no other new lesions, LE edema - 2-3+ rle, 1-2+ LLE               Psychiatric: Pt behavior is normal without agitation   Micro: none  Cardiac tracings I have personally interpreted today:  none  Pertinent Radiological findings (summarize): none   Lab Results  Component Value Date   WBC 4.3 09/22/2023   HGB 10.9 (L) 09/22/2023   HCT 33.0 (L) 09/22/2023   PLT 153.0 09/22/2023   GLUCOSE 71 09/22/2023   CHOL 176 07/16/2023   TRIG 85 07/16/2023   HDL 55 07/16/2023   LDLDIRECT 121.4 04/10/2013   LDLCALC 104 (H) 07/16/2023   ALT 10 09/22/2023   AST 17 09/22/2023   NA 139 09/22/2023   K 3.3 (L) 09/22/2023   CL 104 09/22/2023   CREATININE 0.82 09/22/2023   BUN 17 09/22/2023   CO2 25 09/22/2023   TSH 0.901 07/14/2023   INR 1.4 (H) 07/16/2023   HGBA1C 5.3 09/22/2023   Assessment/Plan:  Megan Baldwin is a 89 y.o. Black or African American [2] female with  has a past medical history of Allergic rhinitis, Anxiety, Aortic valve regurgitation (10/12/2010), CKD (chronic kidney disease) stage 3, GFR 30-59 ml/min (HCC) (09/26/2017), COPD (chronic obstructive pulmonary disease) (HCC), Disc disease, degenerative, cervical, Diverticulosis of colon, DJD (degenerative joint disease), Gallstones, History of colonic polyps, Hyperlipidemia, Hypertension, and Osteoporosis.  CKD (chronic kidney disease) stage 3, GFR 30-59 ml/min (HCC) Lab Results  Component Value Date   CREATININE 0.82 09/22/2023   Stable overall, cont to avoid nephrotoxins   Dementia (HCC) Apears to be worsening now at least mild, declines tx or neurology for now  Essential hypertension BP Readings from Last 3 Encounters:  07/22/23 124/77  06/22/23 128/78  05/13/23 (!) 144/72   Stable, pt to continue medical treatment norvasc  5 qd   Vitamin D  deficiency Last vitamin D  Lab Results  Component Value Date   VD25OH 50.48 05/13/2023    Stable, cont oral replacement   Arthritis of left hip Worsening, left hip only though recent films with bilateral advanced djd, for referral orthopedic per daughter, pt does not want surgury but possible cortisone might be indicated  B12 deficiency Lab Results  Component Value Date   VITAMINB12 1,205 (H) 07/18/2023   Stable, cont oral replacement - b12 1000 mcg qd   Peripheral edema Multifactorial likely with venous insufficiency, hx LLE DVT vs other - declines other evaluation, but for lasix  20 every day and f/u 3 wks  Followup: Return in about 3 weeks (around 10/13/2023).  Lynwood Rush, MD 09/22/2023 8:07 PM [facility]

## 2023-09-22 NOTE — Assessment & Plan Note (Signed)
 Multifactorial likely with venous insufficiency, hx LLE DVT vs other - declines other evaluation, but for lasix  20 every day and f/u 3 wks

## 2023-09-22 NOTE — Assessment & Plan Note (Signed)
 Lab Results  Component Value Date   CREATININE 0.82 09/22/2023   Stable overall, cont to avoid nephrotoxins

## 2023-09-22 NOTE — Assessment & Plan Note (Signed)
 Lab Results  Component Value Date   VITAMINB12 1,205 (H) 07/18/2023   Stable, cont oral replacement - b12 1000 mcg qd

## 2023-09-22 NOTE — Assessment & Plan Note (Signed)
 BP Readings from Last 3 Encounters:  07/22/23 124/77  06/22/23 128/78  05/13/23 (!) 144/72   Stable, pt to continue medical treatment norvasc  5 qd

## 2023-09-22 NOTE — Patient Instructions (Signed)
 Nurse assistant to check weight today  Please take all new medication as prescribed-  the lasix  20 mg per day, continue with a low salt diet, leg elevation when sitting, and compression stockings as you do  Please continue all other medications as before, and refills have been done if requested.  Please have the pharmacy call with any other refills you may need.  Please keep your appointments with your specialists as you may have planned  You will be contacted regarding the referral for: [facility] for the left hip  Please go to the LAB at the blood drawing area for the tests to be done  You will be contacted by phone if any changes need to be made immediately.  Otherwise, you will receive a letter about your results with an explanation, but please check with MyChart first.  Please make an Appointment to return in 3 weeks, or sooner if needed

## 2023-09-22 NOTE — Assessment & Plan Note (Signed)
 Apears to be worsening now at least mild, declines tx or neurology for now

## 2023-09-22 NOTE — Assessment & Plan Note (Signed)
 Last vitamin D Lab Results  Component Value Date   VD25OH 50.48 05/13/2023   Stable, cont oral replacement

## 2023-10-10 ENCOUNTER — Telehealth: Payer: Self-pay | Admitting: Internal Medicine

## 2023-10-10 NOTE — Telephone Encounter (Signed)
 Patient dropped off document [facility] for [facility] Attend[facility], to be filled out by provider. Patient requested to send it back via Mail within 5-days. Document is located in providers tray at front office.Please advise at Mobile [phone] (mobile)

## 2023-10-12 NOTE — Telephone Encounter (Signed)
 Document received, will be working on this tomorrow to hand off to Dr.John for signature

## 2023-10-14 NOTE — Telephone Encounter (Signed)
 Currently being handled by supervisor

## 2023-10-18 ENCOUNTER — Encounter: Payer: Self-pay | Admitting: Internal Medicine

## 2023-10-19 DIAGNOSIS — I351 Nonrheumatic aortic (valve) insufficiency: Secondary | ICD-10-CM | POA: Diagnosis not present

## 2023-10-19 DIAGNOSIS — J309 Allergic rhinitis, unspecified: Secondary | ICD-10-CM | POA: Diagnosis not present

## 2023-10-19 DIAGNOSIS — Z9181 History of falling: Secondary | ICD-10-CM | POA: Diagnosis not present

## 2023-10-19 DIAGNOSIS — M160 Bilateral primary osteoarthritis of hip: Secondary | ICD-10-CM | POA: Diagnosis not present

## 2023-10-19 DIAGNOSIS — Z87440 Personal history of urinary (tract) infections: Secondary | ICD-10-CM | POA: Diagnosis not present

## 2023-10-19 DIAGNOSIS — R739 Hyperglycemia, unspecified: Secondary | ICD-10-CM | POA: Diagnosis not present

## 2023-10-19 DIAGNOSIS — M810 Age-related osteoporosis without current pathological fracture: Secondary | ICD-10-CM | POA: Diagnosis not present

## 2023-10-19 DIAGNOSIS — E785 Hyperlipidemia, unspecified: Secondary | ICD-10-CM | POA: Diagnosis not present

## 2023-10-19 DIAGNOSIS — K5730 Diverticulosis of large intestine without perforation or abscess without bleeding: Secondary | ICD-10-CM | POA: Diagnosis not present

## 2023-10-19 DIAGNOSIS — Z7901 Long term (current) use of anticoagulants: Secondary | ICD-10-CM | POA: Diagnosis not present

## 2023-10-19 DIAGNOSIS — I2782 Chronic pulmonary embolism: Secondary | ICD-10-CM | POA: Diagnosis not present

## 2023-10-19 DIAGNOSIS — Z90710 Acquired absence of both cervix and uterus: Secondary | ICD-10-CM | POA: Diagnosis not present

## 2023-10-19 DIAGNOSIS — Z556 Problems related to health literacy: Secondary | ICD-10-CM | POA: Diagnosis not present

## 2023-10-20 ENCOUNTER — Telehealth: Payer: Self-pay

## 2023-10-20 NOTE — Telephone Encounter (Signed)
 Copied from CRM #[id number]. Topic: General - Other >> Oct 20, 2023 11:48 AM Thersia BROCKS wrote: Reason for CRM: Patient daughter called in regarding status [facility] Paperwork, would like for a nurse to give her a callback regarding  [phone]

## 2023-10-21 NOTE — Telephone Encounter (Signed)
 I dont recall doing this specifically, so please check the Media tab to check for the form if scanned into the EPIC;  I dont anything else to offer if this is not found.  Perhaps we need to fill out a new VA form    thanks

## 2023-10-24 NOTE — Telephone Encounter (Signed)
 Called and spoke with patient's daughter regarding form completion. I was able to confirm some information needed for completion. Informed her we would reach back out once completed and get this mailed out

## 2023-10-25 ENCOUNTER — Telehealth: Payer: Self-pay

## 2023-10-25 ENCOUNTER — Encounter: Payer: Self-pay | Admitting: Internal Medicine

## 2023-10-25 MED ORDER — IPRATROPIUM-ALBUTEROL 0.5-2.5 (3) MG/3ML IN SOLN: 3.0000 mL | Freq: Four times a day (QID) | RESPIRATORY_TRACT | 3 refills | Status: AC | PRN

## 2023-10-25 MED ORDER — AMLODIPINE BESYLATE 5 MG PO TABS: 5.0000 mg | ORAL_TABLET | Freq: Every day | ORAL | 3 refills | Status: AC

## 2023-10-25 MED ORDER — APIXABAN 5 MG PO TABS: 5.0000 mg | ORAL_TABLET | Freq: Two times a day (BID) | ORAL | 3 refills | Status: DC

## 2023-10-25 MED ORDER — ROSUVASTATIN CALCIUM 10 MG PO TABS: 10.0000 mg | ORAL_TABLET | Freq: Every day | ORAL | 3 refills | Status: AC

## 2023-10-25 MED ORDER — TRAMADOL HCL 50 MG PO TABS: 50.0000 mg | ORAL_TABLET | Freq: Four times a day (QID) | ORAL | 0 refills | Status: AC | PRN

## 2023-10-25 MED ORDER — FUROSEMIDE 20 MG PO TABS: 20.0000 mg | ORAL_TABLET | Freq: Every day | ORAL | 3 refills | Status: AC

## 2023-10-25 NOTE — Telephone Encounter (Signed)
 Pt should already have refills based on the 8 months of supply that was prescribed in may 2025, so this is difficult to understand, but I did send refill for another 3 months.   Please let family know she needs ROV for further refills, though she may qualify for a total of 6 mo tx only, depending on review of her chart and her response to treatment.

## 2023-10-25 NOTE — Addendum Note (Signed)
 Addended by: NORLEEN LYNWOOD ORN on: 10/25/2023 09:14 AM   Modules accepted: Orders

## 2023-10-25 NOTE — Telephone Encounter (Signed)
 Good morning!  When speaking to patient's daughter yesterday they made me aware that the elequis script was due for a refill. This medication was started during patient's last hospital visit but I was not sure if you wanted this to be continued or not

## 2023-11-03 NOTE — Telephone Encounter (Signed)
 Spoke with patient daughter,Evette, okay per DPR, to review in detials needs for the [place] paperwork.   Paperwork to be completed, signed, and mailed requested address.   Daughter appreciative of conversation, no additional questions at this time.

## 2023-11-08 ENCOUNTER — Telehealth: Payer: Self-pay | Admitting: *Deleted

## 2023-11-08 NOTE — Progress Notes (Unsigned)
 Complex Care Management Care Guide Note  11/08/2023 Name: [name] MRN: [id number] DOB: [birth year]  Megan Baldwin is a 89 y.o. year old female who is a primary care patient of Norleen Lynwood ORN, MD and is actively engaged with the care management team. I reached out to Hargis Lied by phone today to assist with re-scheduling  with the Licensed Clinical Child psychotherapist.  Follow up plan: Unsuccessful telephone outreach attempt made. A HIPAA compliant phone message was left for the patient providing contact information and requesting a return call.  Thedford Franks, CMA, Care Guide [facility]  [facility], [facility] Guide Direct Dial: [phone]  Fax: [phone] Website: [email or web address]

## 2023-11-09 NOTE — Progress Notes (Signed)
 Complex Care Management Care Guide Note  11/09/2023 Name: Megan Baldwin MRN: [id number] DOB: Feb 10, 1934  Megan Baldwin is a 89 y.o. year old female who is a primary care patient of Norleen Lynwood ORN, MD and is actively engaged with the care management team. I reached out to Hargis Lied by phone today to assist with re-scheduling  with the Licensed Clinical Child psychotherapist.  Follow up plan: Telephone appointment with complex care management team member scheduled for:  11/28/2023  Thedford Franks, CMA [place]  [facility], [facility] Guide Direct Dial: [phone]  Fax: [phone] Website: [email or web address]

## 2023-11-28 ENCOUNTER — Other Ambulatory Visit: Payer: Self-pay | Admitting: *Deleted

## 2023-11-28 NOTE — Patient Instructions (Signed)
 Visit Information  Thank you for taking time to visit with me today. Please don't hesitate to contact me if I can be of assistance to you if the need arises   Patient has met all care management goals. Care Management case will be closed. Patient has been provided contact information should new needs arise.    Please call the Suicide and Crisis Lifeline: 988 call the [place]  National Suicide Prevention Lifeline: 240-083-6983 or TTY: (479)663-9043 TTY 830-464-5232) to talk to a trained counselor call 1-800-273-TALK (toll free, 24 hour hotline) call 911 if you are experiencing a Mental Health or Behavioral Health Crisis or need someone to talk to.  Toy Samarin, LCSW [place]  [facility], [facility] Licensed Clinical Social Worker  Direct Dial: [phone]

## 2023-11-28 NOTE — Patient Outreach (Signed)
 Complex Care Management   Visit Note  11/28/2023  Name:  Megan Baldwin MRN: [id number] DOB: Mar 17, 1933  Situation: Referral received for Complex Care Management related to COPD and in home care needs. I obtained verbal consent from daughter.  Visit completed with daughter  on the phone   Background:   Past Medical History:  Diagnosis Date   Allergic rhinitis    Anxiety    Aortic valve regurgitation 10/12/2010   CKD (chronic kidney disease) stage 3, GFR 30-59 ml/min (HCC) 09/26/2017   COPD (chronic obstructive pulmonary disease) (HCC)    Disc disease, degenerative, cervical    Diverticulosis of colon    DJD (degenerative joint disease)    LS spine   Gallstones    History of colonic polyps    Hyperlipidemia    Hypertension    Osteoporosis     Assessment: Patient Reported Symptoms:  Cognitive Cognitive Status: Unable to Assess      Neurological Neurological Review of Symptoms: No symptoms reported    HEENT HEENT Symptoms Reported: No symptoms reported      Cardiovascular Cardiovascular Symptoms Reported: No symptoms reported    Respiratory Respiratory Symptoms Reported: No symptoms reported    Endocrine Endocrine Symptoms Reported: No symptoms reported    Gastrointestinal Gastrointestinal Symptoms Reported: No symptoms reported      Genitourinary Genitourinary Symptoms Reported: No symptoms reported    Integumentary Integumentary Symptoms Reported: No symptoms reported    Musculoskeletal Musculoskelatal Symptoms Reviewed: Joint pain Additional Musculoskeletal Details: Hip pain, needs hip replacement Musculoskeletal Management Strategies: Coping strategies, Medication therapy Falls in the past year?: Yes Number of falls in past year: 1 or less Was there an injury with Fall?: Yes Fall Risk Category Calculator: 2 Patient Fall Risk Level: Moderate Fall Risk    Psychosocial       Quality of Family Relationships: supportive Do you feel physically threatened by  others?: No    11/28/2023    PHQ2-9 Depression Screening   Little interest or pleasure in doing things Not at all  Feeling down, depressed, or hopeless Not at all  PHQ-2 - Total Score 0  Trouble falling or staying asleep, or sleeping too much    Feeling tired or having little energy    Poor appetite or overeating     Feeling bad about yourself - or that you are a failure or have let yourself or your family down    Trouble concentrating on things, such as reading the newspaper or watching television    Moving or speaking so slowly that other people could have noticed.  Or the opposite - being so fidgety or restless that you have been moving around a lot more than usual    Thoughts that you would be better off dead, or hurting yourself in some way    PHQ2-9 Total Score    If you checked off any problems, how difficult have these problems made it for you to do your work, take care of things at home, or get along with other people    Depression Interventions/Treatment      There were no vitals filed for this visit.  Medications Reviewed Today     Reviewed by Ermalinda Lenn HERO, LCSW (Social Worker) on 11/28/23 at 1353  Med List Status: <None>   Medication Order Taking? Sig Documenting Provider Last Dose Status Informant  acetaminophen  (TYLENOL ) 500 MG tablet [id number] Yes Take 2 tablets (1,000 mg total) by mouth 3 (three) times daily. Laurence Locus, DO  Active  amLODipine  (NORVASC ) 5 MG tablet [id number] Yes Take 1 tablet (5 mg total) by mouth daily. Norleen Lynwood ORN, MD  Active   apixaban  (ELIQUIS ) 5 MG TABS tablet [id number] Yes Take 1 tablet (5 mg total) by mouth 2 (two) times daily. Norleen Lynwood ORN, MD  Active   diclofenac  Sodium (VOLTAREN ) 1 % GEL [id number] Yes Apply 2 g topically 4 (four) times daily. Norleen Lynwood ORN, MD  Active   donepezil  (ARICEPT ) 5 MG tablet [id number] Yes Take 1 tablet (5 mg total) by mouth at bedtime. Laurence Locus, DO  Active   furosemide  (LASIX ) 20 MG tablet [id number] Yes  Take 1 tablet (20 mg total) by mouth daily. Norleen Lynwood ORN, MD  Active   ipratropium-albuterol  (DUONEB) 0.5-2.5 (3) MG/3ML SOLN [id number] Yes Take 3 mLs by nebulization every 6 (six) hours as needed. Norleen Lynwood ORN, MD  Active   potassium chloride  (KLOR-CON  10) 10 MEQ tablet [id number] Yes Take 1 tablet (10 mEq total) by mouth daily. Norleen Lynwood ORN, MD  Active   rosuvastatin  (CRESTOR ) 10 MG tablet [id number] Yes Take 1 tablet (10 mg total) by mouth daily. Norleen Lynwood ORN, MD  Active   senna-docusate (SENOKOT-S) 8.6-50 MG tablet [id number] Yes Take 1 tablet by mouth at bedtime as needed for mild constipation. Laurence Locus, DO  Active   traMADol  (ULTRAM ) 50 MG tablet [id number] Yes Take 1 tablet (50 mg total) by mouth every 6 (six) hours as needed (for pain). Norleen Lynwood ORN, MD  Active   vitamin B-12 (CYANOCOBALAMIN ) 1000 MCG tablet [id number] Yes Take 1 tablet (1,000 mcg total) by mouth daily. Norleen Lynwood ORN, MD  Active Family Member            Recommendation:   PCP Follow-up Patient's daughter to continue with plan for patient to receive in home care through [facility] Patient's daughter to continue to follow up with the [facility] to apply for Aid and Attendance  Follow Up Plan:   Patient has met all care management goals. Care Management case will be closed. Patient has been provided contact information should new needs arise.    Annabeth Tortora, LCSW [place]  [facility], [facility] Licensed Clinical Social Worker  Direct Dial: [phone]

## 2024-01-02 ENCOUNTER — Encounter: Payer: Self-pay | Admitting: Radiology

## 2024-03-21 ENCOUNTER — Other Ambulatory Visit: Payer: Self-pay | Admitting: Internal Medicine

## 2024-03-21 ENCOUNTER — Other Ambulatory Visit: Payer: Self-pay

## 2024-03-29 ENCOUNTER — Encounter: Payer: Self-pay | Admitting: Internal Medicine

## 2024-03-29 ENCOUNTER — Ambulatory Visit: Payer: Self-pay | Admitting: *Deleted

## 2024-03-29 DIAGNOSIS — R300 Dysuria: Secondary | ICD-10-CM | POA: Insufficient documentation

## 2024-03-29 NOTE — Telephone Encounter (Signed)
" ° ° ° ° °  FYI Only or Action Required?: Action required by provider: clinical question for provider, update on patient condition, and requesting order for urine sample to be collected at home and brought to office due to mobility issues and inclement weather conditions for OV.  Patient was last seen in primary care on 09/22/2023 by Norleen Lynwood ORN, MD.  Called Nurse Triage reporting Urinary Frequency.  Symptoms began unsure how long .  Interventions attempted: Other: unknown.  Symptoms are: gradually worsening.  Triage Disposition: See Physician Within 24 Hours  Patient/caregiver understands and will follow disposition?: No, wishes to speak with PCP    Patient daughter requesting call back if urine sample can be ordered and collected by patient's [facility] aide. Please advise.              Reason for Disposition  Age > 50 years  Answer Assessment - Initial Assessment Questions Patient's daughter requesting if PCP will order for [facility] aide to collect urine specimen and deliver to office. Patient daughter not with patient and reports she is unsure of OV now due to patient mobility issues and inclement weather getting in to and out of car. Please advise , patient daughter requesting call back.        1. SEVERITY: How bad is the pain?  (e.g., Scale 1-10; mild, moderate, or severe)     Did not report pain with urination per patient daughter on DPR, not with patient now.  2. FREQUENCY: How many times have you had painful urination today?      Unknown per patient daughter .  4. ONSET: When did the painful urination start?      Unsure but has been on going since leaving Rehab 5. FEVER: Do you have a fever? If Yes, ask: What is your temperature, how was it measured, and when did it start?     No  6. PAST UTI: Have you had a urine infection before? If Yes, ask: When was the last time? and What happened that time?      Yes last year  7. CAUSE: What do you think is  causing the painful urination?  (e.g., UTI, scratch, Herpes sore)     UTI 8. OTHER SYMPTOMS: Do you have any other symptoms? (e.g., blood in urine, flank pain, genital sores, urgency, vaginal discharge)     Urinary frequency, dark colored urine per [facility] aide, agitation noted, hip side pain.  Protocols used: Urination Pain - Female-A-AH  "

## 2024-03-29 NOTE — Telephone Encounter (Signed)
 Ok for verbal to Home Health for UA and culture - R30.0 dysuria

## 2024-03-30 NOTE — Telephone Encounter (Signed)
 Called and spoke with patient's daughter. Informed her of PCP response and worked through options for patient to be evaluated. They noted they would try to get transportation so that patient could be seen by urgent care or ED

## 2024-03-30 NOTE — Telephone Encounter (Signed)
 Copied from CRM #[id number]. Topic: Clinical - Red Word Triage >> Mar 30, 2024 10:25 AM Drema MATSU wrote: Pt daughter stated that home healthcare service cant do the urine sample. Pt daughter wants to know if nurse can call [facility] to see if they can provide the service. D[place] is in another [place] and it is hard to bring mom in. Please call Evette.She wants to find the least challenging option for mom. This encounter was created in error - please disregard.

## 2024-03-30 NOTE — Telephone Encounter (Signed)
 This RN called patient's daughter, Yolande (on [place]). Daughter stated she called [facility] and they cannot complete urinalysis at this time. Daughter stated the patient was last seen in the fall by [facility] is is wondering if a call can be placed to them instead. [facility] returned call to daughter during our call and advised that patient has been discharged from them. This RN advised that in-person evaluation would be the best way to proceed. Daughter declined, stated she lives out of state and it would be hard for the patient to come into the office, due to mobility issues. Daughter is requesting a virtual appointment for symptoms. Please advise. Daughter would like a call back.  Copied from CRM #[id number]. Topic: Clinical - Red Word Triage >> Mar 30, 2024 10:25 AM Drema MATSU wrote: Pt daughter stated that home healthcare service cant do the urine sample. Pt daughter wants to know if nurse can call [facility] to see if they can provide the service. Daughter is in another [place] and it is hard to bring mom in. Please call Evette.She wants to find the least challenging option for mom.

## 2024-03-30 NOTE — Telephone Encounter (Signed)
 Due to this unusual situation, it may sound drastic but if she has a possible UTI, it would be best to go to ED by Ambulance for evaluation and urine testing, if unable to have transportation any other way.   I would not feel comfortable just prescribing and antibiotic as there are several other things that can also cause similar symptoms

## 2024-03-30 NOTE — Telephone Encounter (Signed)
 Attempted to call [facility] to provide order but had to leave a voice message  [facility]  Phone: [phone]

## 2024-05-25 ENCOUNTER — Ambulatory Visit: Payer: Medicare (Managed Care) | Admitting: Nurse Practitioner

## 2024-09-20 ENCOUNTER — Encounter: Payer: Medicare (Managed Care) | Admitting: Nurse Practitioner
